# Patient Record
Sex: Male | Born: 1965 | Race: White | Hispanic: No | Marital: Single | Smoking: Former smoker
Health system: Southern US, Community
[De-identification: ages and names within clinical notes are randomized; demographics above are authoritative.]

## PROBLEM LIST (undated history)

## (undated) DIAGNOSIS — I4729 Other ventricular tachycardia: Secondary | ICD-10-CM

## (undated) DIAGNOSIS — M79606 Pain in leg, unspecified: Secondary | ICD-10-CM

## (undated) DIAGNOSIS — E785 Hyperlipidemia, unspecified: Secondary | ICD-10-CM

## (undated) DIAGNOSIS — N200 Calculus of kidney: Secondary | ICD-10-CM

## (undated) DIAGNOSIS — I2510 Atherosclerotic heart disease of native coronary artery without angina pectoris: Secondary | ICD-10-CM

## (undated) DIAGNOSIS — I472 Ventricular tachycardia: Secondary | ICD-10-CM

## (undated) DIAGNOSIS — M9250 Juvenile osteochondrosis of tibia and fibula, unspecified leg: Secondary | ICD-10-CM

## (undated) DIAGNOSIS — M92529 Juvenile osteochondrosis of tibia tubercle, unspecified leg: Secondary | ICD-10-CM

## (undated) HISTORY — DX: Pain in leg, unspecified: M79.606

## (undated) NOTE — *Deleted (*Deleted)
Transition of Care [facility]) - CM/SW Discharge Note   Patient Details  Name: TAEVEON KEESLING MRN: [id number] Date of Birth: 1966-05-10  Transition of Care [facility]) CM/SW Contact:  Lorri Frederick, LCSW Phone Number: 06/01/2020, 3:56 PM   Clinical Narrative:   Pt will discharge today if wound vac is delivered.  [employer] processing order currently.  HH in place with [facility]    Final next level of care: Home w Home Health Services Barriers to Discharge: Equipment Delay (waiting on wound vac)   Patient Goals and CMS Choice        Discharge Placement                       Discharge Plan and Services   Discharge Planning Services: CM Consult Post Acute Care Choice: Home Health, Durable Medical Equipment          DME Arranged: 3-N-1, Oxygen, Walker rolling DME Agency: [facility] Date DME Agency Contacted: 06/01/20 Time DME Agency Contacted: 1056 Representative spoke with at DME Agency: Velna Hatchet HH Arranged: RN, PT [facility] Agency: [facility] Date HH Agency Contacted: 05/31/20 Time HH Agency Contacted: 1143 Representative spoke with at [facility] Agency: weekend coverage, Bonita Quin  Social Determinants of Health (SDOH) Interventions     Readmission Risk Interventions No flowsheet data found.

---

## 1993-07-25 HISTORY — PX: LUMBAR DISC SURGERY: SHX700

## 1993-07-25 HISTORY — PX: BACK SURGERY: SHX140

## 2002-08-24 ENCOUNTER — Encounter: Payer: Self-pay | Admitting: Orthopedic Surgery

## 2002-08-24 ENCOUNTER — Emergency Department (HOSPITAL_COMMUNITY): Admission: EM | Admit: 2002-08-24 | Discharge: 2002-08-24 | Payer: Self-pay | Admitting: Emergency Medicine

## 2010-03-18 ENCOUNTER — Ambulatory Visit (HOSPITAL_COMMUNITY): Admission: RE | Admit: 2010-03-18 | Discharge: 2010-03-18 | Payer: Self-pay | Admitting: Specialist

## 2010-04-21 ENCOUNTER — Emergency Department (HOSPITAL_COMMUNITY): Admission: EM | Admit: 2010-04-21 | Discharge: 2010-04-21 | Payer: Self-pay | Admitting: Emergency Medicine

## 2010-07-25 HISTORY — PX: ULNAR COLLATERAL LIGAMENT REPAIR: SHX6159

## 2010-10-04 ENCOUNTER — Emergency Department (HOSPITAL_COMMUNITY)
Admission: EM | Admit: 2010-10-04 | Discharge: 2010-10-04 | Disposition: A | Discharge status: Home / Self Care | Payer: 59 | Attending: Emergency Medicine | Admitting: Emergency Medicine

## 2010-10-04 DIAGNOSIS — G8929 Other chronic pain: Secondary | ICD-10-CM | POA: Insufficient documentation

## 2010-10-04 DIAGNOSIS — R110 Nausea: Secondary | ICD-10-CM | POA: Insufficient documentation

## 2010-10-04 DIAGNOSIS — N200 Calculus of kidney: Secondary | ICD-10-CM | POA: Insufficient documentation

## 2010-10-04 DIAGNOSIS — R1032 Left lower quadrant pain: Secondary | ICD-10-CM | POA: Insufficient documentation

## 2010-10-04 DIAGNOSIS — M549 Dorsalgia, unspecified: Secondary | ICD-10-CM | POA: Insufficient documentation

## 2010-10-04 LAB — URINALYSIS, ROUTINE W REFLEX MICROSCOPIC
Bilirubin Urine: NEGATIVE
Glucose, UA: NEGATIVE mg/dL
Hgb urine dipstick: NEGATIVE
Ketones, ur: 15 mg/dL — AB
Nitrite: NEGATIVE
Protein, ur: NEGATIVE mg/dL
Specific Gravity, Urine: 1.017 (ref 1.005–1.030)
Urobilinogen, UA: 1 mg/dL (ref 0.0–1.0)
pH: 5.5 (ref 5.0–8.0)

## 2010-10-07 LAB — URINE MICROSCOPIC-ADD ON

## 2010-10-07 LAB — COMPREHENSIVE METABOLIC PANEL
ALT: 26 U/L (ref 0–53)
AST: 30 U/L (ref 0–37)
CO2: 20 mEq/L (ref 19–32)
Calcium: 8.9 mg/dL (ref 8.4–10.5)
Creatinine, Ser: 1.16 mg/dL (ref 0.4–1.5)
GFR calc Af Amer: >60 mL/min (ref >60)
GFR calc non Af Amer: >60 mL/min (ref >60)
Sodium: 137 mEq/L (ref 135–145)
Total Protein: 6.8 g/dL (ref 6.0–8.3)

## 2010-10-07 LAB — CBC
Hemoglobin: 14.7 g/dL (ref 13.0–17.0)
MCH: 30.8 pg (ref 26.0–34.0)
MCHC: 34.3 g/dL (ref 30.0–36.0)
Platelets: 192 10*3/uL (ref 150–400)
RDW: 13.3 % (ref 11.5–15.5)

## 2010-10-07 LAB — URINALYSIS, ROUTINE W REFLEX MICROSCOPIC
Bilirubin Urine: NEGATIVE
Glucose, UA: NEGATIVE mg/dL
Ketones, ur: NEGATIVE mg/dL
Leukocytes, UA: NEGATIVE
Nitrite: NEGATIVE
Protein, ur: NEGATIVE mg/dL
Specific Gravity, Urine: 1.016 (ref 1.005–1.030)
Urobilinogen, UA: 0.2 mg/dL (ref 0.0–1.0)
pH: 5 (ref 5.0–8.0)

## 2010-10-07 LAB — DIFFERENTIAL
Eosinophils Relative: 1 % (ref 0–5)
Lymphocytes Relative: 17 % (ref 12–46)
Lymphs Abs: 1.4 10*3/uL (ref 0.7–4.0)
Monocytes Relative: 5 % (ref 3–12)

## 2010-10-07 LAB — LIPASE, BLOOD: Lipase: 18 U/L (ref 11–59)

## 2010-12-10 NOTE — Consult Note (Signed)
NAME:  Adam Haney NO.:  [id number]   MEDICAL RECORD NO.:  [id number]                   PATIENT TYPE:  EMS   LOCATION:  ED                                   FACILITY:  [facility]   PHYSICIAN:  Dionne Ano. Everlene Other, M.D.         DATE OF BIRTH:  [birth year]   DATE OF CONSULTATION:  DATE OF DISCHARGE:  08/24/2002                                   CONSULTATION   HISTORY OF PRESENT ILLNESS:  Adam Haney presented to the [facility]  Emergency Room secondary to an injury to his right hand.  The patient was  working with a wood splitter today and had a contusive injury from the  activity.  He had a direct blow onto the middle and proximal phalanx of his  middle finger, right hand.  This is his dominate extremity.  He also  complains of some generalized hand pain.  I have evaluated him at length.  He notes no other injury.  His past medical and surgical history are  reviewed as are allergies and medications.  The patient is well known to [place]  through [facility].   PHYSICAL EXAMINATION:  On examination he is a black male in no acute  distress.  The patient had an elevated blood pressure, likely secondary to  pain.  His remaining vitals are stable.  He has a normal HEENT, chest, and  abdomen exam.  Left upper extremity is neurovascularly intact.  Right upper  extremity has pain at the middle phalanx, excruciatingly in the recent past  but improving.  He has no signs of compartment syndrome, neurovascular  compromise, or open laceration.  His nail bed is __________.  He has normal  active PFTS and extensor function as I isolate them to exam by all fingers  and thumb.  The patient does have exquisite pain over the middle phalanx  with any palpation.  He is also known to have pain in the proximal phalanx  as well about the middle finger.   X-rays reviewed and they show no fracture or dislocation especially of  __________.   IMPRESSION:   Contusive injury to the right middle finger after a wood  splitter injury.   PLAN:  I have discussed with him ice, elevation.  I have given him Vicodin  for extreme pain and written him out of work until follow up check in the  office in less than a week and discussed some dos and don'ts, etc.  Should  his pain worsen or vascular compromise ensue or other problems develop, he  will notify me.  Otherwise, will see him at that appointment.  All questions  have been encouraged and answered.  Dionne Ano. Everlene Other, M.D.    Nash Mantis  D:  08/24/2002  T:  08/24/2002  Job:  [id number]

## 2011-02-08 ENCOUNTER — Ambulatory Visit (HOSPITAL_COMMUNITY)
Admission: RE | Admit: 2011-02-08 | Discharge: 2011-02-08 | Disposition: A | Discharge status: Home / Self Care | Payer: Self-pay | Source: Ambulatory Visit | Attending: Orthopedic Surgery | Admitting: Orthopedic Surgery

## 2011-02-08 DIAGNOSIS — X58XXXA Exposure to other specified factors, initial encounter: Secondary | ICD-10-CM | POA: Insufficient documentation

## 2011-02-08 DIAGNOSIS — Z01812 Encounter for preprocedural laboratory examination: Secondary | ICD-10-CM | POA: Insufficient documentation

## 2011-02-08 DIAGNOSIS — F17200 Nicotine dependence, unspecified, uncomplicated: Secondary | ICD-10-CM | POA: Insufficient documentation

## 2011-02-08 DIAGNOSIS — S63659A Sprain of metacarpophalangeal joint of unspecified finger, initial encounter: Secondary | ICD-10-CM | POA: Insufficient documentation

## 2011-02-08 LAB — DIFFERENTIAL
Eosinophils Relative: 2 % (ref 0–5)
Lymphocytes Relative: 34 % (ref 12–46)
Lymphs Abs: 2.9 10*3/uL (ref 0.7–4.0)
Monocytes Relative: 5 % (ref 3–12)
Neutrophils Relative %: 60 % (ref 43–77)

## 2011-02-08 LAB — SURGICAL PCR SCREEN
MRSA, PCR: NEGATIVE
Staphylococcus aureus: POSITIVE — AB

## 2011-02-08 LAB — COMPREHENSIVE METABOLIC PANEL
BUN: 11 mg/dL (ref 6–23)
CO2: 24 mEq/L (ref 19–32)
Chloride: 107 mEq/L (ref 96–112)
Creatinine, Ser: 0.79 mg/dL (ref 0.50–1.35)
GFR calc Af Amer: >60 mL/min (ref >60)
GFR calc non Af Amer: >60 mL/min (ref >60)
Total Bilirubin: 0.7 mg/dL (ref 0.3–1.2)

## 2011-02-08 LAB — CBC
HCT: 41.1 % (ref 39.0–52.0)
MCH: 30.3 pg (ref 26.0–34.0)
MCV: 88.4 fL (ref 78.0–100.0)
RBC: 4.65 MIL/uL (ref 4.22–5.81)
WBC: 8.7 10*3/uL (ref 4.0–10.5)

## 2011-03-14 NOTE — Op Note (Signed)
NAME:  Adam Haney, Adam Haney NO.:  [id number]  MEDICAL RECORD NO.:  [id number]  LOCATION:  SDSC                         FACILITY:  [facility]  PHYSICIAN:  Dionne Ano. Rashawnda Gaba, M.D.DATE OF BIRTH:  [birth year]  DATE OF PROCEDURE:  02/08/2011 DATE OF DISCHARGE:                              OPERATIVE REPORT   PREOPERATIVE DIAGNOSIS:  Left thumb ulnar collateral ligament tear, complete.  POSTOPERATIVE DIAGNOSIS:  Left thumb ulnar collateral ligament tear, complete.  PROCEDURE:  Ulnar collateral ligament repair, left thumb metacarpophalangeal joint.  SURGEON:  Dionne Ano. Amanda Pea, MD  ASSISTANT:  Karie Chimera, PA-C  COMPLICATIONS:  None.  ANESTHESIA:  General.  TOURNIQUET TIME:  Less than 30 minutes.  INDICATIONS FOR PROCEDURE:  A pleasant male presents with above- mentioned diagnosis.  I have counseled him regarding risks and benefits of the surgery and he desires to proceed with the above-mentioned operative intervention.  I have discussed with him pre and postop issues, do's and dont's, relative indications and contraindications, and risks and benefits of surgery.  With this in mind, he desires to proceed.  I have discussed with him specifically that we are trading in a pre- arthritic and stable and painful situation for a situation that in time will be pain-free, stable, albeit somewhat stiff, and hopefully nonarthritic in sequelae.  He understands this and desires to proceed.  OPERATION IN DETAIL:  The patient was seen by myself and Anesthesia, taken to the operating suite, and he underwent smooth induction of general anesthesia, time-out called, consent verified, signed, and reviewed.  Following this, the patient then underwent sterile prep and drape, sterile field was secured.  Arm was elevated, tourniquet was insufflated to 250 mmHg.  A curvilinear incision was made ulnarly about the left thumb which was noted to be in stable under evaluation  under anesthesia.  Following this, I made an incision through skin only, dissected down bluntly, swept superficial radial nerve proximally. Following this, I incised the abductor to gain perfect exposure to the joint, identified a complete tear of the ulnar collateral ligament, and prepared the insertion site about the proximal phalanx.  The patient had insertion site prepared.  Following this, two 1.0 JuggerKnots were placed.  This was a Biomet JuggerKnot placed out difficulty.  It was secured and following being secured, I then repaired it by placing 4 strands through the ulnar collateral ligament, tied them upon themselves.  I then oversewed the capsule and following this repaired the abductor.  He had excellent stability and range of motion was stable.  There were no complicating features.  I deflated the tourniquet, obtained hemostasis with bipolar cautery, checked superficial radial nerve which looked to be excellently positioned in out of harm's way, and then closed the wound with Prolene.  We then placed a sterile thumb spica splint on the patient.  He was awoken from anesthesia, stable in the recovery room.  All sponge, needle, and instrument counts were correct.  He will return to see [place] in the office in 10-14 days for suture removal and our standard postop protocol according to the [facility] protocol will be adhered to.  I did not feel that he would require a  formal pinning of the joint and thus elected not to proceed with pinning.  We will line up therapy at [facility].  Do's and dont's have been discussed and all questions have been encouraged and answered.     Dionne Ano. Amanda Pea, M.D.     [facility]  D:  02/08/2011  T:  02/09/2011  Job:  [id number]  Electronically Signed by Dominica Severin M.D. on 03/14/2011 06:30:17 AM

## 2012-11-19 ENCOUNTER — Emergency Department (HOSPITAL_COMMUNITY): Payer: PRIVATE HEALTH INSURANCE

## 2012-11-19 ENCOUNTER — Encounter (HOSPITAL_COMMUNITY)
Admission: EM | Disposition: A | Discharge status: Home / Self Care | Payer: Self-pay | Source: Home / Self Care | Attending: Cardiovascular Disease

## 2012-11-19 ENCOUNTER — Inpatient Hospital Stay (HOSPITAL_COMMUNITY)
Admission: EM | Admit: 2012-11-19 | Discharge: 2012-11-22 | DRG: 247 | Disposition: A | Discharge status: Home / Self Care | Payer: PRIVATE HEALTH INSURANCE | Attending: Cardiovascular Disease | Admitting: Cardiovascular Disease

## 2012-11-19 ENCOUNTER — Encounter (HOSPITAL_COMMUNITY): Payer: Self-pay | Admitting: *Deleted

## 2012-11-19 ENCOUNTER — Ambulatory Visit (HOSPITAL_COMMUNITY): Admit: 2012-11-19 | Payer: Self-pay | Admitting: Cardiovascular Disease

## 2012-11-19 DIAGNOSIS — Z87442 Personal history of urinary calculi: Secondary | ICD-10-CM

## 2012-11-19 DIAGNOSIS — I4729 Other ventricular tachycardia: Secondary | ICD-10-CM | POA: Diagnosis present

## 2012-11-19 DIAGNOSIS — I2510 Atherosclerotic heart disease of native coronary artery without angina pectoris: Secondary | ICD-10-CM | POA: Diagnosis present

## 2012-11-19 DIAGNOSIS — I472 Ventricular tachycardia, unspecified: Secondary | ICD-10-CM | POA: Diagnosis present

## 2012-11-19 DIAGNOSIS — I2119 ST elevation (STEMI) myocardial infarction involving other coronary artery of inferior wall: Secondary | ICD-10-CM

## 2012-11-19 DIAGNOSIS — F17200 Nicotine dependence, unspecified, uncomplicated: Secondary | ICD-10-CM | POA: Diagnosis present

## 2012-11-19 DIAGNOSIS — Z8249 Family history of ischemic heart disease and other diseases of the circulatory system: Secondary | ICD-10-CM

## 2012-11-19 DIAGNOSIS — I213 ST elevation (STEMI) myocardial infarction of unspecified site: Secondary | ICD-10-CM

## 2012-11-19 DIAGNOSIS — Z720 Tobacco use: Secondary | ICD-10-CM

## 2012-11-19 DIAGNOSIS — E785 Hyperlipidemia, unspecified: Secondary | ICD-10-CM | POA: Diagnosis present

## 2012-11-19 HISTORY — DX: Calculus of kidney: N20.0

## 2012-11-19 HISTORY — PX: LEFT HEART CATHETERIZATION WITH CORONARY ANGIOGRAM: SHX5451

## 2012-11-19 HISTORY — DX: Other ventricular tachycardia: I47.29

## 2012-11-19 HISTORY — DX: Juvenile osteochondrosis of tibia and fibula, unspecified leg: M92.50

## 2012-11-19 HISTORY — DX: Atherosclerotic heart disease of native coronary artery without angina pectoris: I25.10

## 2012-11-19 HISTORY — DX: Ventricular tachycardia: I47.2

## 2012-11-19 HISTORY — DX: Hyperlipidemia, unspecified: E78.5

## 2012-11-19 HISTORY — DX: Juvenile osteochondrosis of tibia tubercle, unspecified leg: M92.529

## 2012-11-19 LAB — CBC
Hemoglobin: 15.6 g/dL (ref 13.0–17.0)
MCH: 30.6 pg (ref 26.0–34.0)
MCHC: 35.3 g/dL (ref 30.0–36.0)
Platelets: 200 10*3/uL (ref 150–400)
RDW: 13.5 % (ref 11.5–15.5)

## 2012-11-19 LAB — COMPREHENSIVE METABOLIC PANEL
ALT: 24 U/L (ref 0–53)
Calcium: 9.1 mg/dL (ref 8.4–10.5)
GFR calc Af Amer: >90 mL/min (ref >90)
Glucose, Bld: 203 mg/dL — ABNORMAL HIGH (ref 70–99)
Sodium: 136 mEq/L (ref 135–145)
Total Protein: 7.4 g/dL (ref 6.0–8.3)

## 2012-11-19 LAB — POCT I-STAT TROPONIN I: Troponin i, poc: 0 ng/mL (ref 0.00–0.08)

## 2012-11-19 LAB — POCT I-STAT, CHEM 8
BUN: 18 mg/dL (ref 6–23)
Chloride: 103 mEq/L (ref 96–112)
Sodium: 139 mEq/L (ref 135–145)

## 2012-11-19 SURGERY — LEFT HEART CATHETERIZATION WITH CORONARY ANGIOGRAM
Anesthesia: LOCAL

## 2012-11-19 MED ORDER — METOPROLOL TARTRATE 25 MG PO TABS
25.0000 mg | ORAL_TABLET | Freq: Two times a day (BID) | ORAL | Status: DC
  Administered 2012-11-19 – 2012-11-22 (×3): 25 mg via ORAL
  Filled 2012-11-19 (×8): qty 1

## 2012-11-19 MED ORDER — VERAPAMIL HCL 2.5 MG/ML IV SOLN
INTRAVENOUS | Status: AC
  Filled 2012-11-19: qty 2

## 2012-11-19 MED ORDER — HEPARIN (PORCINE) IN NACL 2-0.9 UNIT/ML-% IJ SOLN
INTRAMUSCULAR | Status: AC
  Filled 2012-11-19: qty 1000

## 2012-11-19 MED ORDER — HEPARIN SODIUM (PORCINE) 5000 UNIT/ML IJ SOLN
INTRAMUSCULAR | Status: AC
  Administered 2012-11-19: 4000 [IU] via INTRAVENOUS
  Filled 2012-11-19: qty 1

## 2012-11-19 MED ORDER — ZOLPIDEM TARTRATE 5 MG PO TABS: 5.0000 mg | ORAL_TABLET | Freq: Every evening | ORAL | Status: DC | PRN

## 2012-11-19 MED ORDER — ATORVASTATIN CALCIUM 80 MG PO TABS
80.0000 mg | ORAL_TABLET | Freq: Every day | ORAL | Status: DC
  Administered 2012-11-19 – 2012-11-21 (×3): 80 mg via ORAL
  Filled 2012-11-19 (×5): qty 1

## 2012-11-19 MED ORDER — NITROGLYCERIN 0.4 MG SL SUBL: 0.4000 mg | SUBLINGUAL_TABLET | SUBLINGUAL | Status: DC | PRN

## 2012-11-19 MED ORDER — BIVALIRUDIN 250 MG IV SOLR
INTRAVENOUS | Status: AC
  Filled 2012-11-19: qty 250

## 2012-11-19 MED ORDER — PRASUGREL HCL 10 MG PO TABS
ORAL_TABLET | ORAL | Status: AC
  Filled 2012-11-19: qty 6

## 2012-11-19 MED ORDER — SODIUM CHLORIDE 0.9 % IV SOLN: INTRAVENOUS | Status: AC

## 2012-11-19 MED ORDER — LIDOCAINE HCL (PF) 1 % IJ SOLN
INTRAMUSCULAR | Status: AC
  Filled 2012-11-19: qty 30

## 2012-11-19 MED ORDER — ASPIRIN 81 MG PO CHEW
324.0000 mg | CHEWABLE_TABLET | Freq: Once | ORAL | Status: AC
  Administered 2012-11-19: 324 mg via ORAL
  Filled 2012-11-19: qty 4

## 2012-11-19 MED ORDER — ONDANSETRON HCL 4 MG/2ML IJ SOLN: 4.0000 mg | Freq: Four times a day (QID) | INTRAMUSCULAR | Status: DC | PRN

## 2012-11-19 MED ORDER — MORPHINE SULFATE 2 MG/ML IJ SOLN: 2.0000 mg | INTRAMUSCULAR | Status: DC | PRN

## 2012-11-19 MED ORDER — MIDAZOLAM HCL 2 MG/2ML IJ SOLN
INTRAMUSCULAR | Status: AC
  Filled 2012-11-19: qty 2

## 2012-11-19 MED ORDER — HEPARIN SODIUM (PORCINE) 5000 UNIT/ML IJ SOLN
4000.0000 [IU] | INTRAMUSCULAR | Status: AC
  Administered 2012-11-19: 4000 [IU] via INTRAVENOUS

## 2012-11-19 MED ORDER — PRASUGREL HCL 10 MG PO TABS
10.0000 mg | ORAL_TABLET | Freq: Every day | ORAL | Status: DC
  Administered 2012-11-20 – 2012-11-22 (×3): 10 mg via ORAL
  Filled 2012-11-19 (×3): qty 1

## 2012-11-19 MED ORDER — ASPIRIN 81 MG PO CHEW
81.0000 mg | CHEWABLE_TABLET | Freq: Every day | ORAL | Status: DC
  Administered 2012-11-20 – 2012-11-22 (×2): 81 mg via ORAL
  Filled 2012-11-19 (×2): qty 1

## 2012-11-19 MED ORDER — DOPAMINE-DEXTROSE 3.2-5 MG/ML-% IV SOLN
INTRAVENOUS | Status: AC
  Filled 2012-11-19: qty 250

## 2012-11-19 MED ORDER — SODIUM CHLORIDE 0.9 % IV SOLN: INTRAVENOUS | Status: DC

## 2012-11-19 MED ORDER — OXYCODONE-ACETAMINOPHEN 5-325 MG PO TABS
1.0000 | ORAL_TABLET | ORAL | Status: DC | PRN
  Administered 2012-11-19 – 2012-11-21 (×5): 2 via ORAL
  Filled 2012-11-19 (×5): qty 2

## 2012-11-19 MED ORDER — NITROGLYCERIN 1 MG/10 ML FOR IR/CATH LAB
INTRA_ARTERIAL | Status: AC
  Filled 2012-11-19: qty 10

## 2012-11-19 MED ORDER — ACETAMINOPHEN 325 MG PO TABS: 650.0000 mg | ORAL_TABLET | ORAL | Status: DC | PRN

## 2012-11-19 MED ORDER — FENTANYL CITRATE 0.05 MG/ML IJ SOLN
INTRAMUSCULAR | Status: AC
  Filled 2012-11-19: qty 2

## 2012-11-19 MED ORDER — DIAZEPAM 5 MG PO TABS: 5.0000 mg | ORAL_TABLET | ORAL | Status: DC | PRN

## 2012-11-19 NOTE — ED Notes (Signed)
Report given to [facility].  Pt transported to cath lab 5

## 2012-11-19 NOTE — ED Notes (Signed)
Cardiology at bedside.  Explaining cath procedure.  Pt sts now that cp has decreased.  sts its like a burning feeling but cannot rate on a scale.

## 2012-11-19 NOTE — ED Notes (Signed)
Pt c/o CP x 2 hours, also cold like sx since Friday.  Denies SOB, n/v, dizziness/weakness.

## 2012-11-19 NOTE — Interval H&P Note (Signed)
History and Physical Interval Note:  11/19/2012 9:07 PM  Adam Haney  has presented today for cardiac cath with the diagnosis of STEMI  The various methods of treatment have been discussed with the patient and family. After consideration of risks, benefits and other options for treatment, the patient has consented to  Procedure(s): LEFT HEART CATHETERIZATION WITH CORONARY ANGIOGRAM (N/A) as a surgical intervention .  The patient's history has been reviewed, patient examined, no change in status, stable for surgery.  I have reviewed the patient's chart and labs.  Questions were answered to the patient's satisfaction.     MCALHANY,CHRISTOPHER

## 2012-11-19 NOTE — CV Procedure (Signed)
Cardiac Catheterization Operative Report  ARMSTEAD HEILAND [id number] 4/28/20148:55 PM [name], MD  Procedure Performed:  1. Left Heart Catheterization 2. Selective Coronary Angiography 3. Left ventricular angiogram 4. PTCA/DES x 2 distal RCA 5. PTCA/DES x 1 mid RCA  Operator: Verne Carrow, MD  Arterial access site:  Right radial artery.   Indication: 47 yo male with history of tobacco abuse admitted with inferior STEMI.   Delay in PCI secondary to the fact that the patient presented to the ED while we had two other patients having ST elevation MIs in the cath lab. This was after hours with the backup call team and the primary call team actively working on other patients. After arrival to the cath lab, slight delay in PCI secondary to inability to engage the left main artery from the right radial artery.                                       Procedure Details: The risks, benefits, complications, treatment options, and expected outcomes were discussed with the patient. The patient and/or family concurred with the proposed plan, giving informed consent. Emergency consent placed in chart. The patient was brought to the cath lab from the ED. The patient was further sedated with Versed and Fentanyl. The right wrist was assessed with an Allens test which was positive. The right wrist was prepped and draped in a sterile fashion. 1% lidocaine was used for local anesthesia. Using the modified Seldinger access technique, a 6 French sheath was placed in the right radial artery. 3 mg Verapamil was given through the sheath. He was given a bolus of Angiomax and a drip was started. Standard diagnostic catheters were used to perform selective coronary angiography. I ultimately engaged the left main with a XB LAD 3.5 guiding catheter. He was found to have a total occlusion of the distal RCA with high grade disease in the mid RCA with ongoing chest pain and ongoing ST segment elevation in the  inferior leads. The RCA was engaged with a 6 [place] JR4 guiding catheter. I then passed a BMW wire down the RCA into the PDA. A 2.5 x 15 mm balloon was used to pre-dilate tandem severe distal stenoses. Flow was re-established into the distal vessel/PDA/PLA. I then carefully positioned and placed a 2.75 x 16 mm Promus Premier DES in the distal RCA extending into the PDA. I then placed an overlapping 3.25 x 20 mm Promus Premier DES in the distal RCA. The most distal stent was post-dilated with a 2.75 x 12 mm [place] balloon. The most proximal segment of the distal stents was post-dilated with a 3.25 x 15 mm [place] balloon x 2. The PL branch was jailed by the stents but had TIMI-3 flow. I then carefully positioned and deployed a 3.5 x 12 mm Promus Premier DES in the mid RCA. This was post-dilated with a 4.0 x 8 mm [place] balloon x 1. There was excellent flow into the distal vessel. The guide and wire was removed. A pigtail catheter was used to perform a left ventricular angiogram. The sheath was removed from the right radial artery and a Terumo hemostasis band was applied at the arteriotomy site on the right wrist.    There were no immediate complications. The patient was taken to the recovery area in stable condition.   Hemodynamic Findings: Central aortic pressure: 111/78 Left ventricular pressure: 106/16/20  Angiographic Findings:  Left main: 20% proximal stenosis.   Left Anterior Descending Artery: Large caliber vessel that courses to the apex. 80-90% proximal stenosis. The mid and distal vessel has serial 30% stenoses. The first diagonal branch is small to moderate in caliber with 99% stenosis in the distal portion of the vessel where it becomes very small caliber (1.0 mm).   Circumflex Artery: Large caliber vessel with early moderate caliber intermediate branch with mild plaque. The continuation of the AV groove Circumflex is small to moderate in caliber with mild diffuse plaque.   Right Coronary Artery: Large  dominant vessel with mild proximal plaque, 90% mid stenosis, 100% occlusion distal vessel before the bifurcation.   Left Ventricular Angiogram: LVEF 55-60%.   Impression: 1. Acute inferior STEMI secondary to occluded distal RCA 2. Successful PTCA/DES x 2 distal RCA, x 1 mid RCA 3. Severe stenosis proximal LAD/small caliber Diagonal branch 4. Preserved LV systolic function   Recommendations: Will admit to ICU. He will need ASA and Effient for at least one year. Will start statin/beta blocker. Echo in am. Will stage PCI of LAD on Wednesday.        Complications:  None. The patient tolerated the procedure well.

## 2012-11-19 NOTE — H&P (Signed)
   Patient ID: MYRLE DUES MRN: [id number] DOB/AGE: 02/21/1966 47 y.o. Admit date: 11/19/2012  Primary Care Physician: Primary Cardiologist:   HPI: 47 yo male with h/o tobacco abuse but no prior cardiac disease to ED with c/o chest pressure x 2 hours. He has had cough and upper respiratory infection for four days. No fevers or chills. EKG in ED with inferior ST elevation c/w MI. Chest pain is resolved in ED but EKG continues to show ST elevation in inferior leads.   Review of systems complete and found to be negative unless listed above   Past Medical History  Diagnosis Date  . Nephrolithiasis     Family History  Problem Relation Age of Onset  . CAD Mother     History   Social History  . Marital Status: Married    Spouse Name: N/A    Number of Children: N/A  . Years of Education: N/A   Occupational History  . Not on file.   Social History Main Topics  . Smoking status: Current Every Day Smoker -- 0.50 packs/day for 16 years    Types: Cigarettes  . Smokeless tobacco: Not on file  . Alcohol Use: No  . Drug Use: No  . Sexually Active: Not on file   Other Topics Concern  . Not on file   Social History Narrative  . No narrative on file    Past Surgical History  Procedure Laterality Date  . Back surgery  1995    No Known Allergies  Prior to Admission Meds: Hydrocodone prn for back pain  Physical Exam: Blood pressure 138/90, pulse 70, temperature 98.1 F (36.7 C), temperature source Oral, resp. rate 16, height 6\' 3"  (1.905 m), weight 250 lb (113.399 kg), SpO2 98.00%.    General: Well developed, well nourished, NAD  HEENT: OP clear, mucus membranes moist  SKIN: warm, dry. No rashes.  Neuro: No focal deficits  Musculoskeletal: Muscle strength 5/5 all ext  Psychiatric: Mood and affect normal  Neck: No JVD, no carotid bruits, no thyromegaly, no lymphadenopathy.  Lungs:Clear bilaterally, no wheezes, rhonci, crackles  Cardiovascular: Regular rate and rhythm. No  murmurs, gallops or rubs.  Abdomen:Soft. Bowel sounds present. Non-tender.  Extremities: No lower extremity edema. Pulses are 2 + in the bilateral DP/PT.   Labs:   Lab Results  Component Value Date   WBC 8.7 02/08/2011   HGB 16.0 11/19/2012   HCT 47.0 11/19/2012   MCV 88.4 02/08/2011   PLT 268 02/08/2011     Recent Labs Lab 11/19/12 2043  NA 139  K 3.6  CL 103  BUN 18  CREATININE 1.00  GLUCOSE 203*     EKG: Sinus, ST elevation inferior leads.  ASSESSMENT AND PLAN:   1. Inferior STEMI: Plan for emergent cardiac cath with possible PCI. He has been given heparin IV 4000 Units x 1. He has received 324 mg po ASA.    Finnleigh Marchetti 11/19/2012, 8:51 PM

## 2012-11-19 NOTE — ED Provider Notes (Addendum)
History     CSN: [id number]  Arrival date & time 11/19/12  2007   First MD Initiated Contact with Patient 11/19/12 2019      Chief Complaint  Patient presents with  . Code STEMI  level V caveat due to urgent need for intervention  (Consider location/radiation/quality/duration/timing/severity/associated sxs/prior treatment) The history is provided by the patient.  patient presented with chest pain. On his left chest pressure. He states he's had a cough and shortness of breathfor the last week. No nausea or dizziness. No diaphoresis. No previous heart problems.he is a current smoker.initial EKG shows ST elevation.  Past Medical History  Diagnosis Date  . Nephrolithiasis     Past Surgical History  Procedure Laterality Date  . Back surgery  1995    Family History  Problem Relation Age of Onset  . CAD Mother     History  Substance Use Topics  . Smoking status: Current Every Day Smoker -- 0.50 packs/day for 16 years    Types: Cigarettes  . Smokeless tobacco: Not on file  . Alcohol Use: No      Review of Systems  Constitutional: Negative for activity change and appetite change.  HENT: Negative for neck stiffness.   Eyes: Negative for pain.  Respiratory: Positive for cough. Negative for chest tightness and shortness of breath.   Cardiovascular: Positive for chest pain. Negative for leg swelling.  Gastrointestinal: Negative for nausea, vomiting, abdominal pain and diarrhea.  Genitourinary: Negative for flank pain.  Musculoskeletal: Positive for back pain.  Skin: Negative for pallor and rash.    Allergies  Review of patient's allergies indicates no known allergies.  Home Medications  No current outpatient prescriptions on file.  BP 127/88  Pulse 64  Temp(Src) 98.1 F (36.7 C) (Oral)  Resp 20  Ht 6\' 3"  (1.905 m)  Wt 250 lb (113.399 kg)  BMI 31.25 kg/m2  SpO2 99%  Physical Exam  Nursing note and vitals reviewed. Constitutional: He is oriented to person,  place, and time. He appears well-developed and well-nourished.  Patient is obese and appears somewhat uncomfortable  HENT:  Head: Normocephalic and atraumatic.  Eyes: EOM are normal. Pupils are equal, round, and reactive to light.  Neck: Normal range of motion. Neck supple.  Cardiovascular: Normal rate, regular rhythm and normal heart sounds.   No murmur heard. Pulmonary/Chest: Effort normal and breath sounds normal.  Abdominal: Soft. Bowel sounds are normal. He exhibits no distension and no mass. There is no tenderness. There is no rebound and no guarding.  Musculoskeletal: Normal range of motion. He exhibits no edema.  Neurological: He is alert and oriented to person, place, and time. No cranial nerve deficit.  Skin: Skin is warm and dry.  Psychiatric: He has a normal mood and affect.    ED Course  Procedures (including critical care time)  Labs Reviewed  CBC - Abnormal; Notable for the following:    WBC 12.1 (*)    All other components within normal limits  POCT I-STAT, CHEM 8 - Abnormal; Notable for the following:    Glucose, Bld 203 (*)    Calcium, Ion 1.08 (*)    All other components within normal limits  APTT  PROTIME-INR  COMPREHENSIVE METABOLIC PANEL  POCT I-STAT TROPONIN I   Dg Chest Port 1 View  11/19/2012  *RADIOLOGY REPORT*  Clinical Data: Myocardial infarction.  Chest pain.  Smoking history.  PORTABLE CHEST - 1 VIEW  Comparison: Artifact overlies the chest.  Findings: Heart size is normal.  Mediastinal shadows are normal. Lungs are clear.  The vascularity is normal.  No effusions.  IMPRESSION: No active disease   Original Report Authenticated By: Paulina Fusi, M.D.      1. STEMI (ST elevation myocardial infarction)     Date: 11/19/2012 2009  Rate: 70  Rhythm: normal sinus rhythm  QRS Axis: normal  Intervals: normal  ST/T Wave abnormalities: ST elevations inferiorly and St depression in aVL  Conduction Disutrbances:none  Narrative Interpretation:   Old EKG  Reviewed: none available   Date: 11/19/2012 2027  Rate: 71  Rhythm: normal sinus rhythm and premature ventricular contractions (PVC)  QRS Axis: normal  Intervals: normal  ST/T Wave abnormalities: ST elevations inferiorly  Conduction Disutrbances:none  Narrative Interpretation: patient now has PVCs.  Old EKG Reviewed: changes noted     MDM  Patient presents with ST elevation MI. Left-sided chest pressure. During the brief stay in the ED the patient's chest pain resolved, however he continued to have ST elevation. Discussed the case with Dr. Clifton James and Dr. Excell Seltzer. Taken Emergently to [employer].         Juliet Rude. Rubin Payor, MD 11/19/12 2146  Juliet Rude. Rubin Payor, MD 11/19/12 [phone]

## 2012-11-20 ENCOUNTER — Encounter (HOSPITAL_COMMUNITY): Payer: Self-pay | Admitting: *Deleted

## 2012-11-20 DIAGNOSIS — I517 Cardiomegaly: Secondary | ICD-10-CM

## 2012-11-20 LAB — TROPONIN I: Troponin I: >20 ng/mL (ref <0.30)

## 2012-11-20 LAB — LIPID PANEL
Cholesterol: 186 mg/dL (ref 0–200)
Total CHOL/HDL Ratio: 4.8 RATIO
VLDL: 17 mg/dL (ref 0–40)

## 2012-11-20 LAB — CBC
MCH: 30.5 pg (ref 26.0–34.0)
Platelets: 183 10*3/uL (ref 150–400)
RBC: 4.65 MIL/uL (ref 4.22–5.81)
WBC: 9.7 10*3/uL (ref 4.0–10.5)

## 2012-11-20 LAB — BASIC METABOLIC PANEL
CO2: 23 mEq/L (ref 19–32)
Calcium: 8.8 mg/dL (ref 8.4–10.5)
GFR calc Af Amer: >90 mL/min (ref >90)
GFR calc non Af Amer: >90 mL/min (ref >90)
Sodium: 136 mEq/L (ref 135–145)

## 2012-11-20 MED ORDER — SODIUM CHLORIDE 0.9 % IJ SOLN
3.0000 mL | Freq: Two times a day (BID) | INTRAMUSCULAR | Status: DC
  Administered 2012-11-20 – 2012-11-21 (×2): 3 mL via INTRAVENOUS

## 2012-11-20 MED ORDER — SODIUM CHLORIDE 0.9 % IV SOLN
INTRAVENOUS | Status: DC
  Administered 2012-11-21: 75 mL via INTRAVENOUS

## 2012-11-20 MED ORDER — ASPIRIN 81 MG PO CHEW
324.0000 mg | CHEWABLE_TABLET | ORAL | Status: AC
  Administered 2012-11-21: 324 mg via ORAL
  Filled 2012-11-20: qty 4

## 2012-11-20 MED ORDER — SODIUM CHLORIDE 0.9 % IV SOLN: 250.0000 mL | INTRAVENOUS | Status: DC | PRN

## 2012-11-20 MED ORDER — SODIUM CHLORIDE 0.9 % IJ SOLN: 3.0000 mL | INTRAMUSCULAR | Status: DC | PRN

## 2012-11-20 MED ORDER — DIAZEPAM 5 MG PO TABS
5.0000 mg | ORAL_TABLET | ORAL | Status: AC
  Administered 2012-11-21: 5 mg via ORAL
  Filled 2012-11-20: qty 1

## 2012-11-20 MED FILL — Sodium Chloride IV Soln 0.9%: INTRAVENOUS | Qty: 50 | Status: AC

## 2012-11-20 NOTE — Progress Notes (Signed)
[id number] Cardiac Rehab Started MI education  with pt. Discussed stent, Effient, ASA, risk factors, restrictions and smoking cessation. Pt states that he quit smoking before and that he feels that he will not have any problems quitting, but he is asking for help when he is discharged if he needs it. Gave pt tips for quitting and coaching contact number. He seems motivated to quit. We will continue to follow pt. Melina Copa RN

## 2012-11-20 NOTE — Progress Notes (Signed)
    SUBJECTIVE: Mild chest pain overnight. No SOB. Some runs of SVT and VT overnight.   Tele: NSR with PVCs this am  BP 104/40  Pulse 76  Temp(Src) 98.4 F (36.9 C) (Oral)  Resp 16  Ht 6\' 3"  (1.905 m)  Wt 250 lb (113.399 kg)  BMI 31.25 kg/m2  SpO2 96%  Intake/Output Summary (Last 24 hours) at 11/20/12 0655 Last data filed at 11/20/12 0300  Gross per 24 hour  Intake    653 ml  Output    425 ml  Net    228 ml    PHYSICAL EXAM General: Well developed, well nourished, in no acute distress. Alert and oriented x 3.  Psych:  Good affect, responds appropriately Neck: No JVD. No masses noted.  Lungs: Clear bilaterally with no wheezes or rhonci noted.  Heart: RRR with no murmurs noted. Abdomen: Bowel sounds are present. Soft, non-tender.  Extremities: No lower extremity edema.   LABS: Basic Metabolic Panel:  Recent Labs  16/10/96 2021 11/19/12 2043 11/20/12 0110  NA 136 139 136  K 3.6 3.6 3.9  CL 98 103 101  CO2 25  --  23  GLUCOSE 203* 203* 165*  BUN 17 18 14   CREATININE 0.91 1.00 0.77  CALCIUM 9.1  --  8.8   CBC:  Recent Labs  11/19/12 2021 11/19/12 2043 11/20/12 0110  WBC 12.1*  --  9.7  HGB 15.6 16.0 14.2  HCT 44.2 47.0 39.9  MCV 86.7  --  85.8  PLT 200  --  183   Cardiac Enzymes:  Recent Labs  11/20/12 0110  TROPONINI >20.00*   Fasting Lipid Panel:  Recent Labs  11/20/12 0110  CHOL 186  HDL 39*  LDLCALC 130*  TRIG 85  CHOLHDL 4.8    Current Meds: . aspirin  81 mg Oral Daily  . atorvastatin  80 mg Oral q1800  . metoprolol tartrate  25 mg Oral BID  . prasugrel  10 mg Oral Daily     ASSESSMENT AND PLAN:  1. Inferior STEMI: Pt admitted last night with STEMI. RCA occluded distally. Now s/p 2 overlapping DES in the distal RCA extending into the PDA and s/p DES x 1 mid RCA. He is stable. Also has severe ostial/proximal LAD stenosis which will need intervention before discharge. Will continue ASA/Effient/metoprolol/high dose statin. Will  stage high grade LAD stenosis PCI for tomorrow. NPO at midnight tonight. Will leave in ICU today with arrythmias overnight. Echo today.   2. Tobacco abuse: Complete smoking cessation recommended.     Mairead Schwarzkopf  4/29/20146:55 AM

## 2012-11-20 NOTE — Progress Notes (Signed)
*  PRELIMINARY RESULTS* Echocardiogram 2D Echocardiogram has been performed.  Adam Haney 11/20/2012, 12:10 PM

## 2012-11-20 NOTE — Progress Notes (Signed)
CRITICAL VALUE ALERT  Critical value received:  TROP >20.00  Date of notification:  11/20/12  Time of notification: 0215am  Critical value read back:yes  Nurse who received alert:  L Uday Jantz RN  MD notified (1st page):  Hochrine  Time of first page: 0218am  Responding MD:  Hochrine  Time MD responded:  0218am  No new orders, will follow serial trops.

## 2012-11-20 NOTE — Care Management Note (Signed)
  Page 1 of 1   11/20/2012     2:24:43 PM   CARE MANAGEMENT NOTE 11/20/2012  Patient:  Adam [facility]aney, Adam Haney   Account Number:  [id number]  Date Initiated:  11/20/2012  Documentation initiated by:  Avie Arenas  Subjective/Objective Assessment:   STEMI - cardiac cath with stenting - plan to go back for further stenting on 4-30.  Has spouse     Action/Plan:   Anticipated DC Date:  11/23/2012   Anticipated DC Plan:  HOME/SELF CARE      DC Planning Services  CM consult      Choice offered to / List presented to:             Status of service:  In process, will continue to follow Medicare Important Message given?   (If response is "NO", the following Medicare IM given date fields will be blank) Date Medicare IM given:   Date Additional Medicare IM given:    Discharge Disposition:    Per UR Regulation:  Reviewed for med. necessity/level of care/duration of stay  If discussed at Long Length of Stay Meetings, dates discussed:    Comments:  Contact:  Kibler,Christy Spouse [phone]                 [facility] [phone]

## 2012-11-20 NOTE — Progress Notes (Signed)
Inpatient Diabetes Program Recommendations  AACE/ADA: New Consensus Statement on Inpatient Glycemic Control (2013)  Target Ranges:  Prepandial:   less than 140 mg/dL      Peak postprandial:   less than 180 mg/dL (1-2 hours)      Critically ill patients:  140 - 180 mg/dL   Reason for Visit: Results for Adam Haney, Adam Haney (MRN [id number]) as of 11/20/2012 12:43  Ref. Range 11/19/2012 20:21 11/19/2012 20:43 11/20/2012 01:10  Glucose Latest Range: 70-99 mg/dL 782 (H) 956 (H) 213 (H)   Note elevated glucose levels.  No history of diabetes noted.  Please check A1C to determine pre-hospitalization glycemic control.  Also please order sensitive Novolog correction tid with meals.  Will follow.

## 2012-11-21 ENCOUNTER — Encounter (HOSPITAL_COMMUNITY)
Admission: EM | Disposition: A | Discharge status: Home / Self Care | Payer: Self-pay | Source: Home / Self Care | Attending: Cardiovascular Disease

## 2012-11-21 ENCOUNTER — Encounter (HOSPITAL_COMMUNITY): Payer: Self-pay | Admitting: General Practice

## 2012-11-21 DIAGNOSIS — I2510 Atherosclerotic heart disease of native coronary artery without angina pectoris: Secondary | ICD-10-CM

## 2012-11-21 HISTORY — PX: PERCUTANEOUS CORONARY STENT INTERVENTION (PCI-S): SHX5485

## 2012-11-21 LAB — PROTIME-INR
INR: 0.95 (ref 0.00–1.49)
Prothrombin Time: 12.6 seconds (ref 11.6–15.2)

## 2012-11-21 LAB — CBC
HCT: 38 % — ABNORMAL LOW (ref 39.0–52.0)
MCHC: 34.7 g/dL (ref 30.0–36.0)
MCV: 88.6 fL (ref 78.0–100.0)
Platelets: 172 10*3/uL (ref 150–400)
RDW: 13.6 % (ref 11.5–15.5)
WBC: 10 10*3/uL (ref 4.0–10.5)

## 2012-11-21 LAB — BASIC METABOLIC PANEL
BUN: 15 mg/dL (ref 6–23)
Calcium: 8.9 mg/dL (ref 8.4–10.5)
Creatinine, Ser: 0.9 mg/dL (ref 0.50–1.35)
GFR calc Af Amer: >90 mL/min (ref >90)

## 2012-11-21 LAB — MAGNESIUM: Magnesium: 1.8 mg/dL (ref 1.5–2.5)

## 2012-11-21 SURGERY — PERCUTANEOUS CORONARY STENT INTERVENTION (PCI-S)
Anesthesia: LOCAL

## 2012-11-21 MED ORDER — HEPARIN (PORCINE) IN NACL 2-0.9 UNIT/ML-% IJ SOLN
INTRAMUSCULAR | Status: AC
  Filled 2012-11-21: qty 1000

## 2012-11-21 MED ORDER — FENTANYL CITRATE 0.05 MG/ML IJ SOLN
INTRAMUSCULAR | Status: AC
  Filled 2012-11-21: qty 2

## 2012-11-21 MED ORDER — LIDOCAINE HCL (PF) 1 % IJ SOLN
INTRAMUSCULAR | Status: AC
  Filled 2012-11-21: qty 30

## 2012-11-21 MED ORDER — MIDAZOLAM HCL 2 MG/2ML IJ SOLN
INTRAMUSCULAR | Status: AC
  Filled 2012-11-21: qty 2

## 2012-11-21 MED ORDER — SODIUM CHLORIDE 0.9 % IV SOLN: INTRAVENOUS | Status: AC

## 2012-11-21 MED ORDER — BIVALIRUDIN 250 MG IV SOLR
INTRAVENOUS | Status: AC
  Filled 2012-11-21: qty 250

## 2012-11-21 NOTE — Progress Notes (Signed)
    SUBJECTIVE: No chest pain or SOB. Several short runs of VT.   BP 104/41  Pulse 54  Temp(Src) 98.3 F (36.8 C) (Oral)  Resp 12  Ht 6\' 3"  (1.905 m)  Wt 250 lb (113.399 kg)  BMI 31.25 kg/m2  SpO2 94%  Intake/Output Summary (Last 24 hours) at 11/21/12 2956 Last data filed at 11/20/12 2200  Gross per 24 hour  Intake    706 ml  Output    150 ml  Net    556 ml    PHYSICAL EXAM General: Well developed, well nourished, in no acute distress. Alert and oriented x 3.  Psych:  Good affect, responds appropriately Neck: No JVD. No masses noted.  Lungs: Clear bilaterally with no wheezes or rhonci noted.  Heart: Huston Foley regular with no murmurs noted. Abdomen: Bowel sounds are present. Soft, non-tender.  Extremities: No lower extremity edema.   LABS: Basic Metabolic Panel:  Recent Labs  21/30/86 0110 11/21/12 0246  NA 136 139  K 3.9 4.7  CL 101 105  CO2 23 23  GLUCOSE 165* 100*  BUN 14 15  CREATININE 0.77 0.90  CALCIUM 8.8 8.9   CBC:  Recent Labs  11/20/12 0110 11/21/12 0246  WBC 9.7 10.0  HGB 14.2 13.2  HCT 39.9 38.0*  MCV 85.8 88.6  PLT 183 172   Cardiac Enzymes:  Recent Labs  11/20/12 0110 11/20/12 0645 11/20/12 1300  TROPONINI >20.00* >20.00* >20.00*   Fasting Lipid Panel:  Recent Labs  11/20/12 0110  CHOL 186  HDL 39*  LDLCALC 130*  TRIG 85  CHOLHDL 4.8    Current Meds: . aspirin  324 mg Oral Pre-Cath  . aspirin  81 mg Oral Daily  . atorvastatin  80 mg Oral q1800  . diazepam  5 mg Oral On Call  . metoprolol tartrate  25 mg Oral BID  . prasugrel  10 mg Oral Daily  . sodium chloride  3 mL Intravenous Q12H     ASSESSMENT AND PLAN:   1. Inferior STEMI: Pt admitted4/28/14 with STEMI. RCA occluded distally. Now s/p 2 overlapping DES in the distal RCA extending into the PDA and s/p DES x 1 mid RCA. He is stable. Also has severe ostial/proximal LAD stenosis. Plans for PCI/DES ostial/proximal LAD this am.  Will continue  ASA/Effient/metoprolol/high dose statin. Echo with preserved LV systolic function.   2. Tobacco abuse: Complete smoking cessation recommended.    MCALHANY,CHRISTOPHER  4/30/20147:13 AM

## 2012-11-21 NOTE — Interval H&P Note (Signed)
History and Physical Interval Note:  11/21/2012 9:58 AM  Adam [facility]aney  has presented today for PCI of the LAD with the diagnosis of CAD with recent inferior STEMI. He is having runs of VT felt to be secondary to his severe ostial/proximal LAD stenosis. He also has a small caliber diagonal with severe disease. We will re-evaluate this lesion today. The various methods of treatment have been discussed with the patient and family. After consideration of risks, benefits and other options for treatment, the patient has consented to  Procedure(s): PERCUTANEOUS CORONARY STENT INTERVENTION (PCI-S) (N/A) as a surgical intervention .  The patient's history has been reviewed, patient examined, no change in status, stable for surgery.  I have reviewed the patient's chart and labs.  Questions were answered to the patient's satisfaction.     Adam Haney

## 2012-11-21 NOTE — Progress Notes (Signed)
Patient having frequent PVC and had 5 beat NSVT  Asymptomatic. Alinda Money PA notified he want me to night dose of metoprolol now and check MAG level. I will continue to monitor.

## 2012-11-21 NOTE — CV Procedure (Signed)
   Cardiac Catheterization Operative Report  [facility] [id number] 4/30/201410:02 AM [name], MD  Procedure Performed:  1. PTCA/DES x proximal LAD  2. Angioseal RFA  Operator: Verne Carrow, MD  Indication:  47 yo male with history of CAD, tobacco abuse admitted with inferior STEMI 11/19/12. Three drug eluting stents were placed in the RCA. He also has a severe ostial/proximal LAD stenosis and a severe stenosis in the small diagonal branch. He has been having runs of VT over the last 24 hours. It is felt that his ostial LAD disease is critical and likely contributing to his VT. Plans for PCI of his LAD today.                           Procedure Details: The risks, benefits, complications, treatment options, and expected outcomes were discussed with the patient. The patient and/or family concurred with the proposed plan, giving informed consent. The patient was brought to the cath lab after IV hydration was begun and oral premedication was given. The patient was further sedated with Versed and Fentanyl. The right groin was prepped and draped in the usual manner. Using the modified Seldinger access technique, a 6 French sheath was placed in the right femoral artery. The left main was engaged with a XB LAD 3.5 guiding catheter. He was given a bolus of Angiomax and a drip was started. He has been previously loaded with Effient. When the ACT was greater than 200, I passed a BMW wire down the LAD. The ostial stenosis was pre-dilated with a 2.5 x 12 mm balloon x 1. I then carefully positioned and deployed a 3.5 x 12 mm Promus Premier DES in the ostium of the LAD. The stent was post-dilated with a 3.75 x 8 mm [place] balloon x 1. There was an excellent angiographic result. I briefly considered PCI of the very small diagonal branch however this branch was covered by the LAD stent. Since the vessel was very small, I did not feel that the benefit of angioplasty alone (too small for stent) would  outweigh the risk of jeopardizing the new LAD stent. The guide was removed. An Angioseal was placed in the right femoral artery.   There were no immediate complications. The patient was taken to the recovery area in stable condition.   Hemodynamic Findings: Central aortic pressure: 105/75  Impression: 1. Severe stenosis proximal LAD 2. Recent inferior STEMI 3. Successful PTCA/DES x 1 ostial/proximal LAD  Recommendations: He will need continued therapy with ASA and Effient for one year. Will continue statin/beta blocker. No Ace-inh with hypotension.        Complications:  None; patient tolerated the procedure well.

## 2012-11-21 NOTE — H&P (View-Only) (Signed)
    SUBJECTIVE: No chest pain or SOB. Several short runs of VT.   BP 104/41  Pulse 54  Temp(Src) 98.3 F (36.8 C) (Oral)  Resp 12  Ht 6' 3" (1.905 m)  Wt 250 lb (113.399 kg)  BMI 31.25 kg/m2  SpO2 94%  Intake/Output Summary (Last 24 hours) at 11/21/12 0713 Last data filed at 11/20/12 2200  Gross per 24 hour  Intake    706 ml  Output    150 ml  Net    556 ml    PHYSICAL EXAM General: Well developed, well nourished, in no acute distress. Alert and oriented x 3.  Psych:  Good affect, responds appropriately Neck: No JVD. No masses noted.  Lungs: Clear bilaterally with no wheezes or rhonci noted.  Heart: Brady regular with no murmurs noted. Abdomen: Bowel sounds are present. Soft, non-tender.  Extremities: No lower extremity edema.   LABS: Basic Metabolic Panel:  Recent Labs  11/20/12 0110 11/21/12 0246  NA 136 139  K 3.9 4.7  CL 101 105  CO2 23 23  GLUCOSE 165* 100*  BUN 14 15  CREATININE 0.77 0.90  CALCIUM 8.8 8.9   CBC:  Recent Labs  11/20/12 0110 11/21/12 0246  WBC 9.7 10.0  HGB 14.2 13.2  HCT 39.9 38.0*  MCV 85.8 88.6  PLT 183 172   Cardiac Enzymes:  Recent Labs  11/20/12 0110 11/20/12 0645 11/20/12 1300  TROPONINI >20.00* >20.00* >20.00*   Fasting Lipid Panel:  Recent Labs  11/20/12 0110  CHOL 186  HDL 39*  LDLCALC 130*  TRIG 85  CHOLHDL 4.8    Current Meds: . aspirin  324 mg Oral Pre-Cath  . aspirin  81 mg Oral Daily  . atorvastatin  80 mg Oral q1800  . diazepam  5 mg Oral On Call  . metoprolol tartrate  25 mg Oral BID  . prasugrel  10 mg Oral Daily  . sodium chloride  3 mL Intravenous Q12H     ASSESSMENT AND PLAN:   1. Inferior STEMI: Pt admitted4/28/14 with STEMI. RCA occluded distally. Now s/p 2 overlapping DES in the distal RCA extending into the PDA and s/p DES x 1 mid RCA. He is stable. Also has severe ostial/proximal LAD stenosis. Plans for PCI/DES ostial/proximal LAD this am.  Will continue  ASA/Effient/metoprolol/high dose statin. Echo with preserved LV systolic function.   2. Tobacco abuse: Complete smoking cessation recommended.    MCALHANY,CHRISTOPHER  4/30/20147:13 AM  

## 2012-11-22 ENCOUNTER — Telehealth: Payer: Self-pay | Admitting: Cardiovascular Disease

## 2012-11-22 ENCOUNTER — Encounter (HOSPITAL_COMMUNITY): Payer: Self-pay | Admitting: Nurse Practitioner

## 2012-11-22 DIAGNOSIS — I219 Acute myocardial infarction, unspecified: Secondary | ICD-10-CM

## 2012-11-22 DIAGNOSIS — E785 Hyperlipidemia, unspecified: Secondary | ICD-10-CM | POA: Diagnosis present

## 2012-11-22 DIAGNOSIS — I472 Ventricular tachycardia: Secondary | ICD-10-CM | POA: Diagnosis present

## 2012-11-22 LAB — CBC
MCHC: 34.1 g/dL (ref 30.0–36.0)
Platelets: 171 10*3/uL (ref 150–400)
RDW: 13.4 % (ref 11.5–15.5)

## 2012-11-22 LAB — BASIC METABOLIC PANEL
BUN: 14 mg/dL (ref 6–23)
GFR calc Af Amer: >90 mL/min (ref >90)
GFR calc non Af Amer: >90 mL/min (ref >90)
Potassium: 4.2 mEq/L (ref 3.5–5.1)
Sodium: 138 mEq/L (ref 135–145)

## 2012-11-22 MED ORDER — PRASUGREL HCL 10 MG PO TABS: 10.0000 mg | ORAL_TABLET | Freq: Every day | ORAL | Status: DC

## 2012-11-22 MED ORDER — ATORVASTATIN CALCIUM 80 MG PO TABS: 80.0000 mg | ORAL_TABLET | Freq: Every day | ORAL | Status: DC

## 2012-11-22 MED ORDER — ASPIRIN 81 MG PO CHEW: 81.0000 mg | CHEWABLE_TABLET | Freq: Every day | ORAL | Status: DC

## 2012-11-22 MED ORDER — METOPROLOL TARTRATE 25 MG PO TABS: 25.0000 mg | ORAL_TABLET | Freq: Two times a day (BID) | ORAL | Status: DC

## 2012-11-22 MED ORDER — NITROGLYCERIN 0.4 MG SL SUBL: 0.4000 mg | SUBLINGUAL_TABLET | SUBLINGUAL | Status: DC | PRN

## 2012-11-22 MED FILL — Sodium Chloride IV Soln 0.9%: INTRAVENOUS | Qty: 50 | Status: AC

## 2012-11-22 NOTE — Discharge Summary (Signed)
Patient ID: Adam Haney,  MRN: [id number], DOB/AGE: [birth year] 47 y.o.  Admit date: 11/19/2012 Discharge date: 11/22/2012  Primary Care Provider: FRY,[name] Primary Cardiologist: C. Clifton James, MD  Discharge Diagnoses Principal Problem:   ST elevation myocardial infarction (STEMI) of inferior wall  **s/p PCI/DES x 2 to distal RCA, DES x 1 to mid RCA, and staged PCI/DES x 1 to LAD.  Active Problems:   Coronary atherosclerosis of native coronary artery   Tobacco abuse   NSVT (nonsustained ventricular tachycardia)  **Asymptomatic;  Normal EF by echo.   Hyperlipidemia  **LDL 130  Allergies No Known Allergies  Procedures  Cardiac Catheterization and Percutaneous Coronary Intervention 4.28.2014  Hemodynamic Findings: Central aortic pressure: 111/78 Left ventricular pressure: 106/16/20  Angiographic Findings:  Left main: 20% proximal stenosis.   Left Anterior Descending Artery: Large caliber vessel that courses to the apex. 80-90% proximal stenosis. The mid and distal vessel has serial 30% stenoses. The first diagonal branch is small to moderate in caliber with 99% stenosis in the distal portion of the vessel where it becomes very small caliber (1.0 mm).   Circumflex Artery: Large caliber vessel with early moderate caliber intermediate branch with mild plaque. The continuation of the AV groove Circumflex is small to moderate in caliber with mild diffuse plaque.   Right Coronary Artery: Large dominant vessel with mild proximal plaque, 90% mid stenosis, 100% occlusion distal vessel before the bifurcation.    **A 2.75 x 16 mm Promus Premier DES in the distal RCA extending into the PDA.  **An overlapping 3.25 x 20 mm Promus Premier DES was placed in the distal RCA.  **A 3.5 x 12 mm Promus Premier DES was placed in the mid RCA.  Left Ventricular Angiogram: LVEF 55-60%.  _____________  Percutaneous Coronary Intervention 4.30.2014   **3.5 x 12 mm Promus Premier DES was placed in  the ostium of the LAD.  _____________  2D Echocardiogram 4.29.2014  Study Conclusions  - Left ventricle: Technically difficult. I do not seea large   wall abnrmality. There is decreased thickening of the   inferior wall. The cavity size was normal. Wall thickness   was increased in a pattern of mild LVH. The estimated   ejection fraction was 55%. - Right ventricle: The cavity size was normal. Systolic   function was mildly reduced. _____________  History of Present Illness  47 y/o male without prior h/o CAD who was in his usual state of health until the evening of admission when he developed sudden onset of chest pressure.  Discomfort persisted for approximately 2 hrs prior to him presenting to the [facility] for evaluation.  There, ECG showed inferior ST elevation.  Code STEMI was called and patient was taken emergently to the cardiac catheterization laboratory.  Hospital Course  Patient underwent emergent diagnostic cardiac catheterization revealing a total occlusion of the distal RCA, which was felt to be the infarct vessel.  He also have severe LAD stenosis.  The RCA was successfully treated using overlapping drug eluting stents distally along with a single DES in the mid-section of the artery.  It was felt that his LAD would require a staged intervention.  In the interim, Mr. Adam Haney was monitored in the coronary intensive care unit.  There, he ruled in for MI, eventually peaking his troponin @ > 20.  He was initiated on asa, effient, high potency statin, and beta blocker therapy.  Acei Inhibitor/ARB therapy was not initiated secondary to relative hypotension.    While monitored in  the CCU, Mr. Adam [facility]aney was noted to have frequent, brief runs of asymptomatic NSVT.  2D echocardiogram was carried out on 4/29 and showed normal LV function.  He was maintained on beta blocker as above.  On 4/30, he was taken back to the cath lab and underwent successful PCI and stenting of the ostial LAD with  placement of a 3.5 x 12mm Promus Premier DES.  He tolerated this procedure well and post-procedure has been ambulating without recurrent symptoms or limitations.  He has been seen by cardiac rehab and counseled on the importance of tobacco cessation, medication compliance, symptom reporting, and lifestyle changes.  He will be discharged home today in good condition.  Discharge Vitals Blood pressure 92/67, pulse 69, temperature 97.8 F (36.6 C), temperature source Oral, resp. rate 14, height 6\' 3"  (1.905 m), weight 251 lb 8.7 oz (114.1 kg), SpO2 96.00%.  Filed Weights   11/19/12 2039 11/22/12 0524  Weight: 250 lb (113.399 kg) 251 lb 8.7 oz (114.1 kg)   Labs  CBC  Recent Labs  11/21/12 0246 11/22/12 0447  WBC 10.0 8.0  HGB 13.2 12.4*  HCT 38.0* 36.4*  MCV 88.6 88.6  PLT 172 171   Basic Metabolic Panel  Recent Labs  11/21/12 0246 11/21/12 2016 11/22/12 0447  NA 139  --  138  K 4.7  --  4.2  CL 105  --  105  CO2 23  --  25  GLUCOSE 100*  --  98  BUN 15  --  14  CREATININE 0.90  --  0.92  CALCIUM 8.9  --  8.8  MG  --  1.8  --    Liver Function Tests  Recent Labs  11/19/12 2021  AST 24  ALT 24  ALKPHOS 95  BILITOT 0.4  PROT 7.4  ALBUMIN 3.4*   Cardiac Enzymes  Recent Labs  11/20/12 0110 11/20/12 0645 11/20/12 1300  TROPONINI >20.00* >20.00* >20.00*   Fasting Lipid Panel  Recent Labs  11/20/12 0110  CHOL 186  HDL 39*  LDLCALC 130*  TRIG 85  CHOLHDL 4.8   Thyroid Function Tests  Recent Labs  11/20/12 0110  TSH 0.924   Disposition  Pt is being discharged home today in good condition.  Follow-up Plans & Appointments  Follow-up Information   Follow up with Adam Newcomer, PA-C On 12/05/2012. (8:50 AM - Dr. Gibson Ramp PA.)    Contact information:   [address] [place] [address] [phone]      Discharge Medications    Medication List    STOP taking these medications       ALKA-SELTZER PLUS COLD & COUGH  11-23-08-325 MG Caps  Generic drug:  [id number]      TAKE these medications       aspirin 81 MG chewable tablet  Chew 1 tablet (81 mg total) by mouth daily.     atorvastatin 80 MG tablet  Commonly known as:  LIPITOR  Take 1 tablet (80 mg total) by mouth daily at 6 PM.     guaiFENesin 600 MG 12 hr tablet  Commonly known as:  MUCINEX  Take 1,200 mg by mouth 2 (two) times daily as needed for congestion.     metoprolol tartrate 25 MG tablet  Commonly known as:  LOPRESSOR  Take 1 tablet (25 mg total) by mouth 2 (two) times daily.     nitroGLYCERIN 0.4 MG SL tablet  Commonly known as:  NITROSTAT  Place 1 tablet (0.4 mg  total) under the tongue every 5 (five) minutes x 3 doses as needed for chest pain.     prasugrel 10 MG Tabs  Commonly known as:  EFFIENT  Take 1 tablet (10 mg total) by mouth daily.      Outstanding Labs/Studies  F/u lipids/lft's in 8 weeks (new statin).  Duration of Discharge Encounter   Greater than 30 minutes including physician time.  Signed, Nicolasa Ducking NP 11/22/2012, 8:06 AM

## 2012-11-22 NOTE — Progress Notes (Signed)
[phone] Cardiac Rehab Pt has been up walked in hall independently denies any cp or SOB. Completed MI and stent education with pt and wife. They voice understanding. Pt declines Outpt. CRP due to his work hours. Melina Copa RN

## 2012-11-22 NOTE — Discharge Summary (Signed)
See full note this am. cdm 

## 2012-11-22 NOTE — Progress Notes (Signed)
PER [employer] $275 COPAY/30 DAY SUPPLY  OF EFFIENT 10MG - NO PRIOR AUTH REQ'd - PATIENT CAN USE ANY PHARMACY. Pt aware prior to discharge home and given Effient 30 day free card and prescription refill card. Elmire Amrein J. Lucretia Roers, RN, BSN, [employer] [phone].

## 2012-11-22 NOTE — Progress Notes (Signed)
    SUBJECTIVE:  Feels great. No chest pain or SOB. Ambulating without difficulty. One 5 beat run VT last 24 hours.   Tele: NSR with PVCs.   BP 92/67  Pulse 69  Temp(Src) 97.8 F (36.6 C) (Oral)  Resp 14  Ht 6\' 3"  (1.905 m)  Wt 251 lb 8.7 oz (114.1 kg)  BMI 31.44 kg/m2  SpO2 96%  Intake/Output Summary (Last 24 hours) at 11/22/12 0701 Last data filed at 11/21/12 1900  Gross per 24 hour  Intake 548.75 ml  Output      0 ml  Net 548.75 ml    PHYSICAL EXAM General: Well developed, well nourished, in no acute distress. Alert and oriented x 3.  Psych:  Good affect, responds appropriately Neck: No JVD. No masses noted.  Lungs: Clear bilaterally with no wheezes or rhonci noted.  Heart: RRR with no murmurs noted. Abdomen: Bowel sounds are present. Soft, non-tender.  Extremities: No lower extremity edema. Right wrist and right groin cath sites ok.   LABS: Basic Metabolic Panel:  Recent Labs  16/10/96 0246 11/21/12 2016 11/22/12 0447  NA 139  --  138  K 4.7  --  4.2  CL 105  --  105  CO2 23  --  25  GLUCOSE 100*  --  98  BUN 15  --  14  CREATININE 0.90  --  0.92  CALCIUM 8.9  --  8.8  MG  --  1.8  --    CBC:  Recent Labs  11/21/12 0246 11/22/12 0447  WBC 10.0 8.0  HGB 13.2 12.4*  HCT 38.0* 36.4*  MCV 88.6 88.6  PLT 172 171   Cardiac Enzymes:  Recent Labs  11/20/12 0110 11/20/12 0645 11/20/12 1300  TROPONINI >20.00* >20.00* >20.00*   Fasting Lipid Panel:  Recent Labs  11/20/12 0110  CHOL 186  HDL 39*  LDLCALC 130*  TRIG 85  CHOLHDL 4.8    Current Meds: . aspirin  81 mg Oral Daily  . atorvastatin  80 mg Oral q1800  . metoprolol tartrate  25 mg Oral BID  . prasugrel  10 mg Oral Daily     ASSESSMENT AND PLAN:   1. Inferior STEMI: Pt admitted 11/19/12 with STEMI. RCA occluded distally. Now s/p 2 overlapping DES in the distal RCA extending into the PDA and s/p DES x 1 mid RCA. Staged PCI yesterday with DES x 1 ostial LAD in area of high grade  stenosis. He is stable this am. Having PVCs since his MI. Will continue ASA/Effient/metoprolol/high dose statin. Echo with preserved LV systolic function.   2. Tobacco abuse: Complete smoking cessation recommended.   3. PVCs: Continue beta blocker.   4. Dispo: D/C home today. He can f/u with me or Tereso Newcomer, PA-C in 10-14 days. He needs to be out of work until f/u appt. He needs work excuse. He also needs Effient starter packet.    MCALHANY,CHRISTOPHER  5/1/20147:01 AM

## 2012-11-22 NOTE — Telephone Encounter (Signed)
TCM management call to patient. Patient contacted regarding discharge from hospital on 11/22/12.  Patient understands to follow up with Tereso Newcomer, PA, on 12/05/12 at 8:50am. Pt verbalizes understanding of medications, followup appt and discharge instructions. Advised pt to call should he have questions, concerns or problems prior to his appt on 12/05/12. Patient agreed to plan.

## 2012-11-22 NOTE — Telephone Encounter (Signed)
**Note De-identified Nicoya Friel Obfuscation** LMTCB

## 2012-11-22 NOTE — Telephone Encounter (Signed)
New Problem:    Patient is scheduled to have a 14 day TCM appointment with Tereso Newcomer PA on 12/05/12 at 8:50am per Ward Givens PA.

## 2012-11-30 ENCOUNTER — Telehealth: Payer: Self-pay | Admitting: Cardiovascular Disease

## 2012-11-30 NOTE — Telephone Encounter (Signed)
LMOVM for Pt FMLA/[employer] paperwork ready For Pick Up 11/30/12/KM

## 2012-11-30 NOTE — Telephone Encounter (Signed)
Pt Picked Up FMLA papers  11/30/12/KM

## 2012-12-05 ENCOUNTER — Encounter: Payer: Self-pay | Admitting: Cardiovascular Disease

## 2012-12-05 ENCOUNTER — Telehealth: Payer: Self-pay | Admitting: Cardiovascular Disease

## 2012-12-05 ENCOUNTER — Ambulatory Visit (INDEPENDENT_AMBULATORY_CARE_PROVIDER_SITE_OTHER): Payer: BC Managed Care – PPO | Admitting: Cardiovascular Disease

## 2012-12-05 ENCOUNTER — Encounter: Payer: Self-pay | Admitting: *Deleted

## 2012-12-05 ENCOUNTER — Encounter: Payer: PRIVATE HEALTH INSURANCE | Admitting: Physician Assistant

## 2012-12-05 VITALS — BP 122/92 | HR 78 | Ht 75.0 in | Wt 246.8 lb

## 2012-12-05 DIAGNOSIS — I2510 Atherosclerotic heart disease of native coronary artery without angina pectoris: Secondary | ICD-10-CM

## 2012-12-05 MED ORDER — METOPROLOL TARTRATE 25 MG PO TABS: 12.5000 mg | ORAL_TABLET | Freq: Two times a day (BID) | ORAL | Status: DC

## 2012-12-05 NOTE — Telephone Encounter (Signed)
New Problem:    Patient's wife called in wanting to know if her husband needed to be seen sooner than tomorrow because he has knots on his legs.  Please call back.

## 2012-12-05 NOTE — Patient Instructions (Signed)
You have an appt with Tereso Newcomer, PA on December 25, 2012 at 8:30  Your physician has recommended you make the following change in your medication: Decrease metoprolol to 12. 5 mg by mouth twice daily.  We will make referral to Cardiac Rehab.

## 2012-12-05 NOTE — Telephone Encounter (Signed)
Reviewed with Dr.McAlhany and he will see pt today at 4:45. I spoke with pt's wife and gave her this appt time.

## 2012-12-05 NOTE — Telephone Encounter (Signed)
Pt seen today. cdm

## 2012-12-05 NOTE — Telephone Encounter (Signed)
Spoke with wife. She reports she noticed "knots" on pt's lower legs last evening. Describes as being one on front and back of both lower legs.  Wife states these are soft ball sized. Normal color. Sore only when touched. Areas soft.  Feet warm to touch and not discolored. Cath site in groin with bruising but no knot in groin area.

## 2012-12-05 NOTE — Progress Notes (Signed)
History of Present Illness: 47 yo Haney with history of CAD with recent inferior STEMI, tobacco abuse, HLD here today for cardiac follow up. He was admitted to [facility] 11/19/12 with inferior STEMI. Urgent cardiac cath 11/19/12 with 90% mid RCA stenosis, 100% distal RCA stenosis. The distal vessel was treated with overlapping drug eluting stents and the mid vessel was treated with a drug eluting stent. He was brought back 12-18-12 for staged PCI of the severe ostial LAD stenosis treated with a DES x 1. He had residual high grade disease in the small caliber diagonal branch which was felt to be too small for PCI. LVEF 55% by echo post cath.   He is here today for cardiac follow up. He is feeling well overall with some fatigue and weakness. No chest pain or SOB. No LE edema. His wife did notice circular areas of swelling in the left calf last night. They were "softball sized" per his wife. He did not notice them until they were pointed out. No redness, associated itching or hives. No insect bites. Resolved last night. No LE edema.   Primary Care Physician: None  Last Lipid Profile:Lipid Panel     Component Value Date/Time   CHOL 186 11/20/2012 0110   TRIG 85 11/20/2012 0110   HDL 39* 11/20/2012 0110   CHOLHDL 4.8 11/20/2012 0110   VLDL 17 11/20/2012 0110   LDLCALC 130* 11/20/2012 0110     Past Medical History  Diagnosis Date  . Coronary artery disease     a. 10/2012 Inf STEMI/Cath/PCI: LM 20p, LAD 80-90p, 6m/d, D1 sm 99d, LCX mild plaque, RCA 100d (2.75x16 Promus Premier DES into PDA, 3.25x20 Promus Prem DES dRCA, 3.5x12 Promus Prem DES mRCA), EF 55-60%;  b. 10/2012 Staged PCI of LAD: 3.5x12 Promus Prem DES.  . Osgood-Schlatter's disease [phone]    "right knee" (12-18-2012)  . Nephrolithiasis     "passed on it's own" (18-Dec-2012)  . Hyperlipidemia   . NSVT (nonsustained ventricular tachycardia)     a. in setting of MI 10/2012 - Echo: EF 55%, mild LVH.    Past Surgical History  Procedure Laterality Date   . Back surgery  1995  . Lumbar disc surgery  1995    "L5-S1 ruptured disc" (12-18-2012)  . Ulnar collateral ligament repair Left 2012    Current Outpatient Prescriptions  Medication Sig Dispense Refill  . aspirin 81 MG chewable tablet Chew 1 tablet (81 mg total) by mouth daily.      [name] atorvastatin (LIPITOR) 80 MG tablet Take 1 tablet (80 mg total) by mouth daily at 6 PM.  30 tablet  6  . guaiFENesin (MUCINEX) 600 MG 12 hr tablet Take 1,200 mg by mouth 2 (two) times daily as needed for congestion.      . metoprolol tartrate (LOPRESSOR) 25 MG tablet Take 1 tablet (25 mg total) by mouth 2 (two) times daily.  60 tablet  6  . nitroGLYCERIN (NITROSTAT) 0.4 MG SL tablet Place 1 tablet (0.4 mg total) under the tongue every 5 (five) minutes x 3 doses as needed for chest pain.  25 tablet  3  . prasugrel (EFFIENT) 10 MG TABS Take 1 tablet (10 mg total) by mouth daily.  30 tablet  6   No current facility-administered medications for this visit.    No Known Allergies  History   Social History  . Marital Status: Married    Spouse Name: N/A    Number of Children: N/A  . Years of  Education: N/A   Occupational History  . Not on file.   Social History Main Topics  . Smoking status: Current Every Day Smoker -- 0.50 packs/day for 18 years    Types: Cigarettes  . Smokeless tobacco: Never Used     Comment: 11/21/2012 offered smoking sessation materials; pt declines  . Alcohol Use: No  . Drug Use: No  . Sexually Active: Yes   Other Topics Concern  . Not on file   Social History Narrative  . No narrative on file    Family History  Problem Relation Age of Onset  . CAD Mother     Review of Systems:  As stated in the HPI and otherwise negative.   BP 122/92  Pulse Adam  Ht 6\' 3"  (1.905 m)  Wt 246 lb 12.8 oz (111.948 kg)  BMI 30.85 kg/m2  Physical Examination: General: Well developed, well nourished, NAD HEENT: OP clear, mucus membranes moist SKIN: warm, dry. No rashes. Neuro: No  focal deficits Musculoskeletal: Muscle strength 5/5 all ext Psychiatric: Mood and affect normal Neck: No JVD, no carotid bruits, no thyromegaly, no lymphadenopathy. Lungs:Clear bilaterally, no wheezes, rhonci, crackles Cardiovascular: Regular rate and rhythm. No murmurs, gallops or rubs. Abdomen:Soft. Bowel sounds present. Non-tender.  Extremities: No lower extremity edema. Pulses are 2 + in the bilateral DP/PT.  EKG: Sinus with occasional PVCs. Rate Adam bpm. T wave abnormality inferior leads.   Cardiac cath 11/19/12: Left main: 20% proximal stenosis.  Left Anterior Descending Artery: Large caliber vessel that courses to the apex. 80-90% proximal stenosis. The mid and distal vessel has serial 30% stenoses. The first diagonal branch is small to moderate in caliber with 99% stenosis in the distal portion of the vessel where it becomes very small caliber (1.0 mm).  Circumflex Artery: Large caliber vessel with early moderate caliber intermediate branch with mild plaque. The continuation of the AV groove Circumflex is small to moderate in caliber with mild diffuse plaque.  Right Coronary Artery: Large dominant vessel with mild proximal plaque, 90% mid stenosis, 100% occlusion distal vessel before the bifurcation.  Left Ventricular Angiogram: LVEF 55-60%.   Assessment and Plan:   1. CAD: He is stable post MI. No chest pains. He is on ASA, Effient and statin. Will have him reduce metoprolol to 12.5 mg po BID with recent fatigue. If he has continued fatigue, will stop Metoprolol. Work excuse until seen 12/25/12.   2. Tobacco abuse: He has stopped smoking.

## 2012-12-06 ENCOUNTER — Encounter: Payer: PRIVATE HEALTH INSURANCE | Admitting: Cardiovascular Disease

## 2012-12-25 ENCOUNTER — Encounter: Payer: Self-pay | Admitting: *Deleted

## 2012-12-25 ENCOUNTER — Ambulatory Visit (INDEPENDENT_AMBULATORY_CARE_PROVIDER_SITE_OTHER): Payer: BC Managed Care – PPO | Admitting: Physician Assistant

## 2012-12-25 ENCOUNTER — Encounter: Payer: Self-pay | Admitting: Physician Assistant

## 2012-12-25 VITALS — BP 132/86 | HR 84 | Ht 75.0 in | Wt 252.4 lb

## 2012-12-25 DIAGNOSIS — I2510 Atherosclerotic heart disease of native coronary artery without angina pectoris: Secondary | ICD-10-CM

## 2012-12-25 DIAGNOSIS — E785 Hyperlipidemia, unspecified: Secondary | ICD-10-CM

## 2012-12-25 DIAGNOSIS — R5383 Other fatigue: Secondary | ICD-10-CM

## 2012-12-25 DIAGNOSIS — R5381 Other malaise: Secondary | ICD-10-CM

## 2012-12-25 LAB — BASIC METABOLIC PANEL
BUN: 11 mg/dL (ref 6–23)
CO2: 27 mEq/L (ref 19–32)
GFR: 97.7 mL/min (ref >60.00)
Glucose, Bld: 92 mg/dL (ref 70–99)
Potassium: 3.8 mEq/L (ref 3.5–5.1)
Sodium: 139 mEq/L (ref 135–145)

## 2012-12-25 LAB — CBC WITH DIFFERENTIAL/PLATELET
Basophils Absolute: 0.1 10*3/uL (ref 0.0–0.1)
Eosinophils Absolute: 0.1 10*3/uL (ref 0.0–0.7)
HCT: 42.4 % (ref 39.0–52.0)
Hemoglobin: 14.2 g/dL (ref 13.0–17.0)
Lymphs Abs: 2.1 10*3/uL (ref 0.7–4.0)
MCHC: 33.4 g/dL (ref 30.0–36.0)
MCV: 91.8 fl (ref 78.0–100.0)
Monocytes Absolute: 0.5 10*3/uL (ref 0.1–1.0)
Monocytes Relative: 5.2 % (ref 3.0–12.0)
Neutro Abs: 6 10*3/uL (ref 1.4–7.7)
Platelets: 224 10*3/uL (ref 150.0–400.0)
RDW: 13.8 % (ref 11.5–14.6)

## 2012-12-25 NOTE — Patient Instructions (Addendum)
LABS TODAY; BMET, CBC W/DIFF  YOU HAVE BEEN GIVEN A LETTER TO STAY OUT OF WORK UNTIL YOUR NEXT FOLLOW  IN THE NEXT FEW WEEKS  PLEASE CALL THE OFFICE [phone] IF YOU ARE STILL HAVING FATIGUE AFTER 1 WEEKS FROM TODAY'S VISIT PER [name] [employer]AVER,P[employer]  YOU HAVE A FOLLOW UP WITH [place] 01/15/13 @ 3:40

## 2012-12-25 NOTE — Progress Notes (Signed)
[address] [place], [place]  [address] Phone: [phone] Fax:  [phone]  Date:  12/25/2012   ID:  Bethann Punches, DOB [birth year], MRN [id number]  PCP:  No primary provider on file.  Cardiologist:  Dr. Verne Carrow     History of Present Illness: Adam Haney is a 47 y.o. male who returns for follow up.  He has a hx of CAD, HL, tobacco abuse.  He was admitted in 10/2102 with an inferior STEMI.  He had 90% mid RCA, 100% distal RCA and 80-90% prox LAD on LHC and was treated with overlapping DES to the dRCA and DES x 1 to the mRCA.  He underwent staged PCI of the LAD with DES during that admission.  He does have a small diagonal with high grade disease that is treated medically as the vessel is not amenable to PCI.  Echo 11/20/12: mild LVH, EF 55%, mild reduced RVSF.  He was seen by Dr. Verne Carrow in f/u 12/05/12.  He had significant fatigue and his beta blocker dose was reduced.  He was given a note to remain out of work until seen today.  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  He stopped his metoprolol 3 days ago.  Fatigue is better with reducing and stopping the metoprolol.  But, he still has significant fatigue.  No chest pain, dyspnea, syncope, near syncope, orthopnea, PND, edema.    Labs (5/14):  K 4.2, Cr 0.92, LDL 130, Hgb 12.4, TSH 0.924   Wt Readings from Last 3 Encounters:  12/25/12 252 lb 6.4 oz (114.488 kg)  12/05/12 246 lb 12.8 oz (111.948 kg)  11/22/12 251 lb 8.7 oz (114.1 kg)     Past Medical History  Diagnosis Date  . Coronary artery disease     a. 10/2012 Inf STEMI/Cath/PCI: LM 20p, LAD 80-90p, 60m/d, D1 sm 99d, LCX mild plaque, RCA 100d (2.75x16 Promus Premier DES into PDA, 3.25x20 Promus Prem DES dRCA, 3.5x12 Promus Prem DES mRCA), EF 55-60%;  b. 10/2012 Staged PCI of LAD: 3.5x12 Promus Prem DES.  . Osgood-Schlatter's disease [phone]    "right knee" (12-01-2012)  . Nephrolithiasis     "passed on it's own"  (Dec 01, 2012)  . Hyperlipidemia   . NSVT (nonsustained ventricular tachycardia)     a. in setting of MI 10/2012 - Echo: EF 55%, mild LVH.    Current Outpatient Prescriptions  Medication Sig Dispense Refill  . aspirin 81 MG chewable tablet Chew 1 tablet (81 mg total) by mouth daily.      [name] atorvastatin (LIPITOR) 80 MG tablet Take 1 tablet (80 mg total) by mouth daily at 6 PM.  30 tablet  6  . nitroGLYCERIN (NITROSTAT) 0.4 MG SL tablet Place 1 tablet (0.4 mg total) under the tongue every 5 (five) minutes x 3 doses as needed for chest pain.  25 tablet  3  . prasugrel (EFFIENT) 10 MG TABS Take 1 tablet (10 mg total) by mouth daily.  30 tablet  6   No current facility-administered medications for this visit.    Allergies:   No Known Allergies  Social History:  The patient  reports that he has quit smoking. His smoking use included Cigarettes. He has a 9 pack-year smoking history. He has never used smokeless tobacco. He reports that he does not drink alcohol or use illicit drugs.   ROS:  Please see the history of present illness.   No melena, hematochezia, hematuria.  No myalgias.   No depression symptoms.  All other systems reviewed and negative.   PHYSICAL EXAM: VS:  BP 132/86  Pulse 84  Ht 6\' 3"  (1.905 m)  Wt 252 lb 6.4 oz (114.488 kg)  BMI 31.55 kg/m2 Well nourished, well developed, in no acute distress HEENT: normal Neck: no JVD Cardiac:  normal S1, S2; RRR; no murmur Lungs:  clear to auscultation bilaterally, no wheezing, rhonchi or rales Abd: soft, nontender, no hepatomegaly Ext: no edema Skin: warm and dry Neuro:  CNs 2-12 intact, no focal abnormalities noted  EKG:  NSR, HR 85, inferior Q waves with inferolateral T wave inversions     ASSESSMENT AND PLAN:  1. Fatigue:  Most likely culprit is the beta blocker.  He is off this for 3 days now.  Will keep him off for now.  Check CBC and BMET.  If weakness continues after 1 week from now, consider holding or changing statin.  Will  keep out of work until he sees me back. 2. CAD:  No angina.  Continue DAPT, statin. He is not certain his insurance will cover his cardiac rehab.  He is walking on his own.  3. Hyperlipidemia:  Continue statin. 4. Tobacco Abuse:  He has quit smoking.   Signed, Tereso Newcomer, PA-C  12/25/2012 8:41 AM

## 2012-12-26 ENCOUNTER — Telehealth: Payer: Self-pay | Admitting: *Deleted

## 2012-12-26 NOTE — Telephone Encounter (Signed)
Message copied by Tarri Fuller on Wed Dec 26, 2012 11:54 AM ------      Message from: [place], [place] T      Created: Tue Dec 25, 2012  5:33 PM       Potassium and creatinine okay      Hemoglobin normal      Continue current Rx      Tereso Newcomer, PA-C        12/25/2012 5:33 PM ------

## 2012-12-26 NOTE — Telephone Encounter (Signed)
voice mail not set up on pt's phone, and his work # they said pt does not work there. I called the pt's wife Adam Haney who has now been notified about lab results with verbal understanding today and said she will let pt know

## 2013-01-15 ENCOUNTER — Ambulatory Visit: Payer: BC Managed Care – PPO | Admitting: Physician Assistant

## 2013-01-15 ENCOUNTER — Ambulatory Visit (INDEPENDENT_AMBULATORY_CARE_PROVIDER_SITE_OTHER): Payer: BC Managed Care – PPO | Admitting: Physician Assistant

## 2013-01-15 ENCOUNTER — Encounter: Payer: Self-pay | Admitting: *Deleted

## 2013-01-15 ENCOUNTER — Encounter: Payer: Self-pay | Admitting: Physician Assistant

## 2013-01-15 ENCOUNTER — Telehealth: Payer: Self-pay | Admitting: Family Medicine

## 2013-01-15 VITALS — BP 140/81 | HR 103 | Ht 75.5 in | Wt 253.0 lb

## 2013-01-15 DIAGNOSIS — R0609 Other forms of dyspnea: Secondary | ICD-10-CM

## 2013-01-15 DIAGNOSIS — F4321 Adjustment disorder with depressed mood: Secondary | ICD-10-CM

## 2013-01-15 DIAGNOSIS — E785 Hyperlipidemia, unspecified: Secondary | ICD-10-CM

## 2013-01-15 DIAGNOSIS — I2510 Atherosclerotic heart disease of native coronary artery without angina pectoris: Secondary | ICD-10-CM

## 2013-01-15 DIAGNOSIS — R5381 Other malaise: Secondary | ICD-10-CM

## 2013-01-15 DIAGNOSIS — R0683 Snoring: Secondary | ICD-10-CM

## 2013-01-15 DIAGNOSIS — R5383 Other fatigue: Secondary | ICD-10-CM

## 2013-01-15 MED ORDER — DILTIAZEM HCL ER COATED BEADS 120 MG PO CP24: 120.0000 mg | ORAL_CAPSULE | Freq: Every day | ORAL | Status: DC

## 2013-01-15 NOTE — Progress Notes (Signed)
[address] [place], [place]  [address] Phone: [phone] Fax:  [phone]  Date:  01/15/2013   ID:  Adam Haney, DOB 03/24/1966, MRN [id number]  PCP:  No primary provider on file.  Cardiologist:  Dr. Verne Carrow     History of Present Illness: Adam Haney is a 47 y.o. male who returns for follow up.  He has a hx of CAD, HL, tobacco abuse.  He was admitted in 10/2102 with an inferior STEMI.  He had 90% mid RCA, 100% distal RCA and 80-90% prox LAD on LHC and was treated with overlapping DES to the dRCA and DES x 1 to the mRCA.  He underwent staged PCI of the LAD with DES during that admission.  He does have a small diagonal with high grade disease that is treated medically as the vessel is not amenable to PCI.  Echo 11/20/12: mild LVH, EF 55%, mild reduced RVSF.  He was seen by Dr. Verne Carrow in f/u 12/05/12.  He had significant fatigue and his beta blocker dose was reduced.  I saw him in f/u 12/25/12 and he continued to progress slowly.  He had stopped his beta blocker prior to seeing me and recommended he remain off of this as I thought this was the culprit for his symptoms.    He continues to feel fatigued.  However, his fatigue is improved off the beta blocker.  He denies chest pain.  No significant dyspnea.  No orthopnea, PND, edema.  No syncope.  He still does not feel like he has the stamina to return to work.  He notes his temper is often short.  He denies being depressed.  His wife is here with him today and she feels like his mood is depressed.  No SI.  She does note that he snores.  He admits to taking frequent naps.    Labs (5/14):  K 4.2, Cr 0.92, LDL 130, Hgb 12.4, TSH 0.924  Labs (6/14):  K 3.8, Cr 0.9, Hgb 14.2   Wt Readings from Last 3 Encounters:  01/15/13 253 lb (114.76 kg)  12/25/12 252 lb 6.4 oz (114.488 kg)  12/05/12 246 lb 12.8 oz (111.948 kg)     Past Medical History  Diagnosis Date  . Coronary artery disease     a.  10/2012 Inf STEMI/Cath/PCI: LM 20p, LAD 80-90p, 44m/d, D1 sm 99d, LCX mild plaque, RCA 100d (2.75x16 Promus Premier DES into PDA, 3.25x20 Promus Prem DES dRCA, 3.5x12 Promus Prem DES mRCA), EF 55-60%;  b. 10/2012 Staged PCI of LAD: 3.5x12 Promus Prem DES.  . Osgood-Schlatter's disease [phone]    "right knee" (11/29/2012)  . Nephrolithiasis     "passed on it's own" (Nov 29, 2012)  . Hyperlipidemia   . NSVT (nonsustained ventricular tachycardia)     a. in setting of MI 10/2012 - Echo: EF 55%, mild LVH.    Current Outpatient Prescriptions  Medication Sig Dispense Refill  . aspirin 81 MG chewable tablet Chew 1 tablet (81 mg total) by mouth daily.      [name] atorvastatin (LIPITOR) 80 MG tablet Take 1 tablet (80 mg total) by mouth daily at 6 PM.  30 tablet  6  . nitroGLYCERIN (NITROSTAT) 0.4 MG SL tablet Place 1 tablet (0.4 mg total) under the tongue every 5 (five) minutes x 3 doses as needed for chest pain.  25 tablet  3  . prasugrel (EFFIENT) 10 MG TABS Take 1 tablet (10 mg total) by mouth daily.  30 tablet  6   No current facility-administered medications for this visit.    Allergies:   No Known Allergies  Social History:  The patient  reports that he has quit smoking. His smoking use included Cigarettes. He has a 9 pack-year smoking history. He has never used smokeless tobacco. He reports that he does not drink alcohol or use illicit drugs.   ROS:  Please see the history of present illness.    All other systems reviewed and negative.   PHYSICAL EXAM: VS:  BP 140/81  Pulse 103  Ht 6' 3.5" (1.918 m)  Wt 253 lb (114.76 kg)  BMI 31.2 kg/m2 Well nourished, well developed, in no acute distress HEENT: normal Neck: no JVD Cardiac:  normal S1, S2; RRR; no murmur Lungs:  clear to auscultation bilaterally, no wheezing, rhonchi or rales Abd: soft, nontender, no hepatomegaly Ext: no edema Skin: warm and dry Neuro:  CNs 2-12 intact, no focal abnormalities noted  EKG:  Sinus tachy, HR 103, inf Q waves,  inf-lat TWI, no change from prior tracing.  ASSESSMENT AND PLAN:  1. Fatigue:  I suspect he is overall depressed.  He may need to consider SSRI Rx.  He sees his PCP next week and can discuss counseling vs medication at that time. He admits to being "scared."  We discussed how this is normal after a life changing event like an MI.  I have encouraged him to continue to increase his activity.  I have encouraged him to return to work.  He does snore and may have OSA as well.  Recent CBC with normal Hgb.   2. Snoring:  Refer for split night sleep study. 3. CAD:  No angina.  Continue DAPT, statin.  HR now elevated and BP is borderline.  Will try him on Cardizem CD 120 mg QD.  Hopefully he can tolerate this.  4. Hyperlipidemia:  Continue statin. 5. Tobacco Abuse: He has quit smoking.  6. Disposition:  F/u with Dr. Verne Carrow in 3 mos.  I have completed his disability paperwork today.  RTW date is 01/28/13.    Signed, Tereso Newcomer, PA-C  01/15/2013 4:15 PM

## 2013-01-15 NOTE — Telephone Encounter (Signed)
Sorry but I am not accepting new patients.

## 2013-01-15 NOTE — Patient Instructions (Addendum)
Your physician has recommended that you have a split night sleep study. This test records several body functions during sleep, including: brain activity, eye movement, oxygen and carbon dioxide blood levels, heart rate and rhythm, breathing rate and rhythm, the flow of air through your mouth and nose, snoring, body muscle movements, and chest and belly movement.  Start Cardizem CD 120mg  daily.  Your physician recommends that you schedule a follow-up appointment in: 3 months with Dr. Clifton James.

## 2013-01-15 NOTE — Telephone Encounter (Signed)
Pt's wife would like to know if you would accept him as a pt.  His wife is [employer].  Pt needs post hospital fup. He had a heart attack.

## 2013-01-16 NOTE — Telephone Encounter (Signed)
Pt aware/going to see Dr Duanne Guess

## 2013-01-24 ENCOUNTER — Encounter: Payer: Self-pay | Admitting: Family Medicine

## 2013-01-24 ENCOUNTER — Ambulatory Visit (INDEPENDENT_AMBULATORY_CARE_PROVIDER_SITE_OTHER): Payer: BC Managed Care – PPO | Admitting: Family Medicine

## 2013-01-24 VITALS — BP 124/82 | HR 89 | Temp 97.8°F | Ht 75.0 in | Wt 259.0 lb

## 2013-01-24 DIAGNOSIS — F4321 Adjustment disorder with depressed mood: Secondary | ICD-10-CM

## 2013-01-24 DIAGNOSIS — I2510 Atherosclerotic heart disease of native coronary artery without angina pectoris: Secondary | ICD-10-CM

## 2013-01-24 MED ORDER — SERTRALINE HCL 50 MG PO TABS: 50.0000 mg | ORAL_TABLET | Freq: Every day | ORAL | Status: DC

## 2013-01-24 NOTE — Progress Notes (Signed)
  Subjective:    Patient ID: Adam Haney, male    DOB: Jun 17, 1966, 47 y.o.   MRN: [id number]  HPI Patient is seen to establish care Patient presented to the emergency department April 2014 with diaphoresis he was diagnosed with inferior ST elevation MI.  He had 90% mid RCA lesion, 100% distal RCA lesion, an 80-90% proximal LAD lesion. He reports that he had 4 stents.  He currently takes several medications including diltiazem, atorvastatin, aspirin, and Effient.  He cannot tolerate beta blockers secondary to fatigue Patient quit smoking following his MI. He has lost several pounds following some dietary modification. Currently walking 2-1/2 miles daily and feels better than he has in several years.  He is, however having some issues with depressed mood. Increased irritability. He has some fear issues regarding going back to work. He denies any suicidal ideation. Some sleep disturbance.  Past Medical History  Diagnosis Date  . Coronary artery disease     a. 10/2012 Inf STEMI/Cath/PCI: LM 20p, LAD 80-90p, 29m/d, D1 sm 99d, LCX mild plaque, RCA 100d (2.75x16 Promus Premier DES into PDA, 3.25x20 Promus Prem DES dRCA, 3.5x12 Promus Prem DES mRCA), EF 55-60%;  b. 10/2012 Staged PCI of LAD: 3.5x12 Promus Prem DES.  . Osgood-Schlatter's disease [phone]    "right knee" (Dec 19, 2012)  . Nephrolithiasis     "passed on it's own" (2012-12-19)  . Hyperlipidemia   . NSVT (nonsustained ventricular tachycardia)     a. in setting of MI 10/2012 - Echo: EF 55%, mild LVH.   Past Surgical History  Procedure Laterality Date  . Back surgery  1995  . Lumbar disc surgery  1995    "L5-S1 ruptured disc" (Dec 19, 2012)  . Ulnar collateral ligament repair Left 2012    reports that he has quit smoking. His smoking use included Cigarettes. He has a 9 pack-year smoking history. He has never used smokeless tobacco. He reports that he does not drink alcohol or use illicit drugs. family history includes CAD in his mother. No  Known Allergies    Review of Systems  Constitutional: Negative for fever, chills, appetite change and unexpected weight change.  Respiratory: Negative for cough and shortness of breath.   Cardiovascular: Negative for chest pain, palpitations and leg swelling.  Gastrointestinal: Negative for nausea and vomiting.  Neurological: Negative for dizziness.  Psychiatric/Behavioral: Positive for dysphoric mood. Negative for suicidal ideas. The patient is nervous/anxious.        Objective:   Physical Exam  Constitutional: He is oriented to person, place, and time. He appears well-developed and well-nourished. No distress.  Neck: Neck supple. No thyromegaly present.  Cardiovascular: Normal rate and regular rhythm.   Pulmonary/Chest: Effort normal and breath sounds normal. No respiratory distress. He has no wheezes. He has no rales.  Musculoskeletal: He exhibits no edema.  Neurological: He is alert and oriented to person, place, and time. No cranial nerve deficit.  Psychiatric: He has a normal mood and affect. His behavior is normal. Judgment and thought content normal.          Assessment & Plan:  #1 adjustment disorder with depressed mood. We discussed options including counseling. Start sertraline 50 mg once daily. Reassess one month. Reviewed possible side effects. #2 history of CAD with recent inferior STEMI.  Currently stable. Patient has followup with cardiology later this month and plans to get followup lipid panel then.

## 2013-02-12 ENCOUNTER — Encounter (HOSPITAL_BASED_OUTPATIENT_CLINIC_OR_DEPARTMENT_OTHER): Payer: BC Managed Care – PPO

## 2013-02-18 ENCOUNTER — Telehealth: Payer: Self-pay | Admitting: Physician Assistant

## 2013-02-18 NOTE — Telephone Encounter (Signed)
lmom effient samples at the front desk. 

## 2013-02-18 NOTE — Telephone Encounter (Signed)
lmom effient samples at the front desk.

## 2013-02-18 NOTE — Telephone Encounter (Signed)
New problem  Per pts wife was told she could get samples of effient

## 2013-03-01 ENCOUNTER — Ambulatory Visit (HOSPITAL_BASED_OUTPATIENT_CLINIC_OR_DEPARTMENT_OTHER): Payer: BC Managed Care – PPO | Attending: Physician Assistant

## 2013-03-01 ENCOUNTER — Ambulatory Visit: Payer: BC Managed Care – PPO | Admitting: Family Medicine

## 2013-03-04 ENCOUNTER — Telehealth: Payer: Self-pay | Admitting: Cardiovascular Disease

## 2013-03-04 NOTE — Telephone Encounter (Signed)
New prob  Pt wife is requesting samples of Effient 10 MG. She said he is completely out and would be willing to come by and get them today.

## 2013-03-04 NOTE — Telephone Encounter (Signed)
Spoke with pt's wife and told her I would leave Effient samples at front desk to be picked up.  Effient 10 mg, 28 tablets, exp 8/15, lot [id number] left at front desk.

## 2013-03-06 ENCOUNTER — Ambulatory Visit: Payer: BC Managed Care – PPO | Admitting: Family Medicine

## 2013-03-06 DIAGNOSIS — Z0289 Encounter for other administrative examinations: Secondary | ICD-10-CM

## 2013-04-01 ENCOUNTER — Telehealth: Payer: Self-pay | Admitting: Cardiovascular Disease

## 2013-04-01 NOTE — Telephone Encounter (Signed)
Spoke with pt's wife and told her I would leave samples of Effient at front desk to be picked up. Effient 10 mg, 28 tablets, Lot [id number], exp 6/15 left at front desk.

## 2013-04-17 ENCOUNTER — Ambulatory Visit (INDEPENDENT_AMBULATORY_CARE_PROVIDER_SITE_OTHER): Payer: BC Managed Care – PPO | Admitting: Cardiovascular Disease

## 2013-04-17 ENCOUNTER — Encounter: Payer: Self-pay | Admitting: Cardiovascular Disease

## 2013-04-17 VITALS — BP 135/86 | HR 91 | Ht 75.0 in | Wt 263.0 lb

## 2013-04-17 DIAGNOSIS — I2510 Atherosclerotic heart disease of native coronary artery without angina pectoris: Secondary | ICD-10-CM

## 2013-04-17 NOTE — Patient Instructions (Addendum)
Your physician wants you to follow-up in:  Early May 2015 You will receive a reminder letter in the mail two months in advance. If you don't receive a letter, please call our office to schedule the follow-up appointment.

## 2013-04-17 NOTE — Progress Notes (Signed)
History of Present Illness: 47 yo male with history of CAD with inferior STEMI, tobacco abuse, HLD here today for cardiac follow up. He was admitted to [facility] 11/19/12 with inferior STEMI. Urgent cardiac cath 11/19/12 with 90% mid RCA stenosis, 100% distal RCA stenosis. The distal vessel was treated with overlapping drug eluting stents and the mid vessel was treated with a drug eluting stent. He was brought back 12/04/12 for staged PCI of the severe ostial LAD stenosis treated with a DES x 1. He had residual high grade disease in the small caliber diagonal branch which was felt to be too small for PCI. LVEF 55% by echo post cath.   He is here today for cardiac follow up. He is feeling well overall. No chest pain or SOB. No LE edema. He is back at work.  Primary Care Physician: None  Last Lipid Profile:Lipid Panel     Component Value Date/Time   CHOL 186 11/20/2012 0110   TRIG 85 11/20/2012 0110   HDL 39* 11/20/2012 0110   CHOLHDL 4.8 11/20/2012 0110   VLDL 17 11/20/2012 0110   LDLCALC 130* 11/20/2012 0110     Past Medical History  Diagnosis Date  . Coronary artery disease     a. 10/2012 Inf STEMI/Cath/PCI: LM 20p, LAD 80-90p, 6m/d, D1 sm 99d, LCX mild plaque, RCA 100d (2.75x16 Promus Premier DES into PDA, 3.25x20 Promus Prem DES dRCA, 3.5x12 Promus Prem DES mRCA), EF 55-60%;  b. 10/2012 Staged PCI of LAD: 3.5x12 Promus Prem DES.  . Osgood-Schlatter's disease [phone]    "right knee" (2012/12/04)  . Nephrolithiasis     "passed on it's own" (2012/12/04)  . Hyperlipidemia   . NSVT (nonsustained ventricular tachycardia)     a. in setting of MI 10/2012 - Echo: EF 55%, mild LVH.    Past Surgical History  Procedure Laterality Date  . Back surgery  1995  . Lumbar disc surgery  1995    "L5-S1 ruptured disc" (Dec 04, 2012)  . Ulnar collateral ligament repair Left 2012    Current Outpatient Prescriptions  Medication Sig Dispense Refill  . aspirin 81 MG chewable tablet Chew 1 tablet (81 mg total) by  mouth daily.      [name] atorvastatin (LIPITOR) 80 MG tablet Take 1 tablet (80 mg total) by mouth daily at 6 PM.  30 tablet  6  . diltiazem (CARDIZEM CD) 120 MG 24 hr capsule Take 1 capsule (120 mg total) by mouth daily.  90 capsule  3  . nitroGLYCERIN (NITROSTAT) 0.4 MG SL tablet Place 1 tablet (0.4 mg total) under the tongue every 5 (five) minutes x 3 doses as needed for chest pain.  25 tablet  3  . prasugrel (EFFIENT) 10 MG TABS Take 1 tablet (10 mg total) by mouth daily.  30 tablet  6  . sertraline (ZOLOFT) 50 MG tablet Take 1 tablet (50 mg total) by mouth daily.  30 tablet  5   No current facility-administered medications for this visit.    No Known Allergies  History   Social History  . Marital Status: Married    Spouse Name: N/A    Number of Children: N/A  . Years of Education: N/A   Occupational History  . Not on file.   Social History Main Topics  . Smoking status: Former Smoker -- 0.50 packs/day for 18 years    Types: Cigarettes  . Smokeless tobacco: Never Used     Comment: 04-Dec-2012 offered smoking sessation materials; pt declines  .  Alcohol Use: No  . Drug Use: No  . Sexual Activity: Yes   Other Topics Concern  . Not on file   Social History Narrative  . No narrative on file    Family History  Problem Relation Age of Onset  . CAD Mother     Review of Systems:  As stated in the HPI and otherwise negative.   BP 135/86  Pulse 91  Ht 6\' 3"  (1.905 m)  Wt 263 lb (119.296 kg)  BMI 32.87 kg/m2  Physical Examination: General: Well developed, well nourished, NAD HEENT: OP clear, mucus membranes moist SKIN: warm, dry. No rashes. Neuro: No focal deficits Musculoskeletal: Muscle strength 5/5 all ext Psychiatric: Mood and affect normal Neck: No JVD, no carotid bruits, no thyromegaly, no lymphadenopathy. Lungs:Clear bilaterally, no wheezes, rhonci, crackles Cardiovascular: Regular rate and rhythm. No murmurs, gallops or rubs. Abdomen:Soft. Bowel sounds present.  Non-tender.  Extremities: No lower extremity edema. Pulses are 2 + in the bilateral DP/PT  Cardiac cath 11/19/12: Left main: 20% proximal stenosis.  Left Anterior Descending Artery: Large caliber vessel that courses to the apex. 80-90% proximal stenosis. The mid and distal vessel has serial 30% stenoses. The first diagonal branch is small to moderate in caliber with 99% stenosis in the distal portion of the vessel where it becomes very small caliber (1.0 mm).  Circumflex Artery: Large caliber vessel with early moderate caliber intermediate branch with mild plaque. The continuation of the AV groove Circumflex is small to moderate in caliber with mild diffuse plaque.  Right Coronary Artery: Large dominant vessel with mild proximal plaque, 90% mid stenosis, 100% occlusion distal vessel before the bifurcation.  Left Ventricular Angiogram: LVEF 55-60%.   Assessment and Plan:   1. CAD: He is stable post MI. No chest pains. He is on ASA, Effient and statin.   2. Tobacco abuse: He has stopped smoking.

## 2013-05-14 ENCOUNTER — Telehealth: Payer: Self-pay

## 2013-05-14 NOTE — Telephone Encounter (Signed)
error 

## 2013-07-03 ENCOUNTER — Encounter: Payer: Self-pay | Admitting: *Deleted

## 2013-07-28 ENCOUNTER — Other Ambulatory Visit: Payer: Self-pay | Admitting: Family Medicine

## 2013-07-29 NOTE — Telephone Encounter (Deleted)
Last visit 01/24/13 Last refill 01/24/13 #30 5

## 2013-07-30 NOTE — Progress Notes (Signed)
This encounter was created in error - please disregard.

## 2013-09-03 ENCOUNTER — Telehealth: Payer: Self-pay | Admitting: *Deleted

## 2013-09-03 NOTE — Telephone Encounter (Signed)
Effient samples given to patients wife.

## 2013-09-23 ENCOUNTER — Telehealth: Payer: Self-pay | Admitting: *Deleted

## 2013-09-23 NOTE — Telephone Encounter (Signed)
Patients wife request effient samples. Samples provided.

## 2013-10-31 ENCOUNTER — Telehealth: Payer: Self-pay

## 2013-10-31 NOTE — Telephone Encounter (Signed)
Patient 's wife got samples of effient for the patient

## 2013-11-08 ENCOUNTER — Encounter: Payer: Self-pay | Admitting: Cardiovascular Disease

## 2013-11-08 ENCOUNTER — Ambulatory Visit (INDEPENDENT_AMBULATORY_CARE_PROVIDER_SITE_OTHER): Payer: BC Managed Care – PPO | Admitting: Cardiovascular Disease

## 2013-11-08 VITALS — BP 140/88 | HR 85 | Ht 74.0 in | Wt 266.0 lb

## 2013-11-08 DIAGNOSIS — Z720 Tobacco use: Secondary | ICD-10-CM

## 2013-11-08 DIAGNOSIS — E785 Hyperlipidemia, unspecified: Secondary | ICD-10-CM

## 2013-11-08 DIAGNOSIS — I2510 Atherosclerotic heart disease of native coronary artery without angina pectoris: Secondary | ICD-10-CM

## 2013-11-08 DIAGNOSIS — F17200 Nicotine dependence, unspecified, uncomplicated: Secondary | ICD-10-CM

## 2013-11-08 LAB — HEPATIC FUNCTION PANEL
ALT: 26 U/L (ref 0–53)
AST: 22 U/L (ref 0–37)
Albumin: 3.7 g/dL (ref 3.5–5.2)
Alkaline Phosphatase: 90 U/L (ref 39–117)
Bilirubin, Direct: 0.1 mg/dL (ref 0.0–0.3)
Total Bilirubin: 0.8 mg/dL (ref 0.3–1.2)
Total Protein: 7.2 g/dL (ref 6.0–8.3)

## 2013-11-08 LAB — LIPID PANEL
Cholesterol: 223 mg/dL — ABNORMAL HIGH (ref 0–200)
HDL: 35.5 mg/dL — ABNORMAL LOW (ref >39.00)
LDL Cholesterol: 161 mg/dL — ABNORMAL HIGH (ref 0–99)
Total CHOL/HDL Ratio: 6
Triglycerides: 132 mg/dL (ref 0.0–149.0)
VLDL: 26.4 mg/dL (ref 0.0–40.0)

## 2013-11-08 MED ORDER — CLOPIDOGREL BISULFATE 75 MG PO TABS
75.0000 mg | ORAL_TABLET | Freq: Every day | ORAL | Status: DC
Start: 2013-11-08 — End: 2014-11-06

## 2013-11-08 MED ORDER — METOPROLOL TARTRATE 25 MG PO TABS: 25.0000 mg | ORAL_TABLET | Freq: Two times a day (BID) | ORAL | Status: DC

## 2013-11-08 NOTE — Patient Instructions (Signed)
Your physician wants you to follow-up in:  6 months. You will receive a reminder letter in the mail two months in advance. If you don't receive a letter, please call our office to schedule the follow-up appointment.  Your physician has recommended you make the following change in your medication:   Stop Effient.  Start Clopidogrel 75 mg by mouth daily. Resume metoprolol tartrate 25 mg by mouth twice daily.

## 2013-11-08 NOTE — Progress Notes (Signed)
History of Present Illness: 48 yo male with history of CAD with inferior STEMI, tobacco abuse, HLD here today for cardiac follow up. He was [facility]12 with inferior STEMI. Urgent cardiac cath 11/19/12 with 90% mid RCA stenosis, 100% distal RCA stenosis. The distal vessel was treated with overlapping drug eluting stents and the mid vessel was treated with a drug eluting stent. He was brought back 11/21/12 for staged PCI of the severe ostial LAD stenosis treated with a DES x 1. He had residual high grade disease in the small caliber diagonal branch which was felt to be too small for PCI. LVEF 55% by echo post cath.   He is here today for cardiac follow up. He is feeling well overall. No chest pain or SOB. No LE edema. He is back at work. He has stopped smoking.   Primary Care Physician: None  Last Lipid Profile:Lipid Panel     Component Value Date/Time   CHOL 186 11/20/2012 0110   TRIG 85 11/20/2012 0110   HDL 39* 11/20/2012 0110   CHOLHDL 4.8 11/20/2012 0110   VLDL 17 11/20/2012 0110   LDLCALC 130* 11/20/2012 0110     Past Medical History  Diagnosis Date  . Coronary artery disease     a. 10/2012 Inf STEMI/Cath/PCI: LM 20p, LAD 80-90p, 1619m/d, D1 sm 99d, LCX mild plaque, RCA 100d (2.75x16 Promus Premier DES into PDA, 3.25x20 Promus Prem DES dRCA, 3.5x12 Promus Prem DES mRCA), EF 55-60%;  b. 10/2012 Staged PCI of LAD: 3.5x12 Promus Prem DES.  . Osgood-Schlatter's disease [phone]    "right knee" (11/21/2012)  . Nephrolithiasis     "passed on it's own" (11/21/2012)  . Hyperlipidemia   . NSVT (nonsustained ventricular tachycardia)     a. in setting of MI 10/2012 - Echo: EF 55%, mild LVH.    Past Surgical History  Procedure Laterality Date  . Back surgery  1995  . Lumbar disc surgery  1995    "L5-S1 ruptured disc" (11/21/2012)  . Ulnar collateral ligament repair Left 2012    Current Outpatient Prescriptions  Medication Sig Dispense Refill  . aspirin 81 MG chewable tablet Chew 1  tablet (81 mg total) by mouth daily.      [name]. atorvastatin (LIPITOR) 80 MG tablet Take 1 tablet (80 mg total) by mouth daily at 6 PM.  30 tablet  6  . nitroGLYCERIN (NITROSTAT) 0.4 MG SL tablet Place 1 tablet (0.4 mg total) under the tongue every 5 (five) minutes x 3 doses as needed for chest pain.  25 tablet  3  . prasugrel (EFFIENT) 10 MG TABS Take 1 tablet (10 mg total) by mouth daily.  30 tablet  6  . sertraline (ZOLOFT) 50 MG tablet TAKE 1 TABLET (50 MG TOTAL) BY MOUTH DAILY.  30 tablet  5   No current facility-administered medications for this visit.    No Known Allergies  History   Social History  . Marital Status: Married    Spouse Name: N/A    Number of Children: N/A  . Years of Education: N/A   Occupational History  . Not on file.   Social History Main Topics  . Smoking status: Former Smoker -- 0.50 packs/day for 18 years    Types: Cigarettes  . Smokeless tobacco: Never Used     Comment: 11/21/2012 offered smoking sessation materials; pt declines  . Alcohol Use: No  . Drug Use: No  . Sexual Activity: Yes   Other Topics Concern  .  Not on file   Social History Narrative  . No narrative on file    Family History  Problem Relation Age of Onset  . CAD Mother     Review of Systems:  As stated in the HPI and otherwise negative.   BP 140/88  Pulse 85  Ht 6\' 2"  (1.88 m)  Wt 266 lb (120.657 kg)  BMI 34.14 kg/m2  Physical Examination: General: Well developed, well nourished, NAD HEENT: OP clear, mucus membranes moist SKIN: warm, dry. No rashes. Neuro: No focal deficits Musculoskeletal: Muscle strength 5/5 all ext Psychiatric: Mood and affect normal Neck: No JVD, no carotid bruits, no thyromegaly, no lymphadenopathy. Lungs:Clear bilaterally, no wheezes, rhonci, crackles Cardiovascular: Regular rate and rhythm. No murmurs, gallops or rubs. Abdomen:Soft. Bowel sounds present. Non-tender.  Extremities: No lower extremity edema. Pulses are 2 + in the bilateral  DP/PT  Cardiac cath 11/19/12: Left main: 20% proximal stenosis.  Left Anterior Descending Artery: Large caliber vessel that courses to the apex. 80-90% proximal stenosis. The mid and distal vessel has serial 30% stenoses. The first diagonal branch is small to moderate in caliber with 99% stenosis in the distal portion of the vessel where it becomes very small caliber (1.0 mm).  Circumflex Artery: Large caliber vessel with early moderate caliber intermediate branch with mild plaque. The continuation of the AV groove Circumflex is small to moderate in caliber with mild diffuse plaque.  Right Coronary Artery: Large dominant vessel with mild proximal plaque, 90% mid stenosis, 100% occlusion distal vessel before the bifurcation.  Left Ventricular Angiogram: LVEF 55-60%.   Assessment and Plan:   1. CAD: He is stable post MI. No chest pains. He is on ASA, Effient and statin. Based on recent findings of DAPT trial, will continue dual anti-platelet therapy. Will stop Effient and start Plavix 75 mg po Qdaily. Will restart Lopressor 25 mg po BID.   2. Tobacco abuse: He has stopped smoking.   3. Hyperlipidemia: Continue statin. Repeat lipids and LFTs today.

## 2013-11-14 ENCOUNTER — Other Ambulatory Visit: Payer: Self-pay | Admitting: *Deleted

## 2013-11-14 ENCOUNTER — Telehealth: Payer: Self-pay | Admitting: *Deleted

## 2013-11-14 DIAGNOSIS — E785 Hyperlipidemia, unspecified: Secondary | ICD-10-CM

## 2013-11-14 MED ORDER — ROSUVASTATIN CALCIUM 40 MG PO TABS: 40.0000 mg | ORAL_TABLET | Freq: Every day | ORAL | Status: DC

## 2013-11-14 NOTE — Telephone Encounter (Signed)
Spoke with pt and reviewed lab results with him. He reports when he was here for recent office visit he was started on metoprolol tartrate 25 mg by mouth twice daily.  He had been on Cardizem CD 120 mg by mouth daily prior to office visit but had been out for 2 days so it was not on his med list. He reports he was changed from metoprolol to Cardizem CD last year. Chart reviewed and this was in June of last year and change was made due to fatigue.  He states he has been taking metoprolol since recent office visit and is tolerating this.  He will continue metoprolol and let us know if he develops fatigue.

## 2013-11-15 NOTE — Progress Notes (Signed)
Patient ID: Adam Haney, male   DOB: [birth year], 48 y.o.   MRN: [id number] Gave patient's wife samples of crestor

## 2013-11-15 NOTE — Telephone Encounter (Signed)
Thanks. cdm 

## 2013-11-26 ENCOUNTER — Other Ambulatory Visit: Payer: Self-pay

## 2013-11-26 NOTE — Telephone Encounter (Signed)
Patient 's wife picked up samples of crestor 20 mg

## 2013-12-23 ENCOUNTER — Telehealth: Payer: Self-pay | Admitting: *Deleted

## 2013-12-23 NOTE — Telephone Encounter (Signed)
Crestor samples provided to patients wife for patient. 

## 2014-01-06 ENCOUNTER — Telehealth: Payer: Self-pay | Admitting: *Deleted

## 2014-01-06 NOTE — Telephone Encounter (Signed)
Patients wife requests crestor samples for patient. Samples provided.

## 2014-02-03 ENCOUNTER — Telehealth: Payer: Self-pay

## 2014-02-03 NOTE — Telephone Encounter (Signed)
Patient's wife got samples of crestor 20 mg, gave to wife

## 2014-02-04 ENCOUNTER — Other Ambulatory Visit: Payer: BC Managed Care – PPO

## 2014-03-04 ENCOUNTER — Telehealth: Payer: Self-pay

## 2014-03-04 NOTE — Telephone Encounter (Signed)
Gave wife Claudina LickChristy Sardo samples of crestor 10 mg samples , We did not have 20 mg so I told him to take 4 tablets of the crestor 10 mg. The patient understood

## 2014-03-21 ENCOUNTER — Telehealth: Payer: Self-pay

## 2014-03-21 NOTE — Telephone Encounter (Signed)
Gave samples to wife(Christy Chestine Spore) for patient

## 2014-04-22 ENCOUNTER — Telehealth: Payer: Self-pay

## 2014-04-22 NOTE — Telephone Encounter (Signed)
Patient's wife asked for samples of crestor 20 mg I gave her 3 weeks worth of samples

## 2014-05-08 ENCOUNTER — Telehealth: Payer: Self-pay | Admitting: *Deleted

## 2014-05-08 NOTE — Telephone Encounter (Signed)
Crestor samples given to wife for patient. 

## 2014-06-06 ENCOUNTER — Telehealth: Payer: Self-pay | Admitting: *Deleted

## 2014-06-06 NOTE — Telephone Encounter (Signed)
Crestor samples provided to wife for patient.

## 2014-06-30 ENCOUNTER — Other Ambulatory Visit: Payer: BC Managed Care – PPO | Admitting: *Deleted

## 2014-06-30 ENCOUNTER — Encounter: Payer: Self-pay | Admitting: *Deleted

## 2014-06-30 DIAGNOSIS — E785 Hyperlipidemia, unspecified: Secondary | ICD-10-CM

## 2014-07-03 ENCOUNTER — Encounter (HOSPITAL_COMMUNITY): Payer: Self-pay | Admitting: Cardiovascular Disease

## 2014-07-07 ENCOUNTER — Telehealth: Payer: Self-pay | Admitting: *Deleted

## 2014-07-07 NOTE — Telephone Encounter (Signed)
Crestor samples given to patients wife for patient.

## 2014-07-24 ENCOUNTER — Telehealth: Payer: Self-pay | Admitting: *Deleted

## 2014-07-24 NOTE — Telephone Encounter (Signed)
Pt's wife requesting samples of Crestor. Crestor 20 mg, 14 tablets, Lot [id number], exp 5/18 left at front desk.  Pt's wife notified pt needs to have repeat lab work and is due for follow up with Dr. Clifton JamesMcAlhany.

## 2014-07-30 ENCOUNTER — Telehealth: Payer: Self-pay | Admitting: *Deleted

## 2014-07-30 NOTE — Telephone Encounter (Signed)
Crestor samples given to wife for patient. 

## 2014-08-04 ENCOUNTER — Other Ambulatory Visit (INDEPENDENT_AMBULATORY_CARE_PROVIDER_SITE_OTHER): Payer: BLUE CROSS/BLUE SHIELD | Admitting: *Deleted

## 2014-08-04 DIAGNOSIS — E785 Hyperlipidemia, unspecified: Secondary | ICD-10-CM

## 2014-08-04 LAB — HEPATIC FUNCTION PANEL
ALBUMIN: 3.6 g/dL (ref 3.5–5.2)
ALK PHOS: 81 U/L (ref 39–117)
ALT: 19 U/L (ref 0–53)
AST: 14 U/L (ref 0–37)
BILIRUBIN TOTAL: 0.7 mg/dL (ref 0.2–1.2)
Bilirubin, Direct: 0 mg/dL (ref 0.0–0.3)
TOTAL PROTEIN: 7.2 g/dL (ref 6.0–8.3)

## 2014-08-04 LAB — LIPID PANEL
CHOL/HDL RATIO: 4
Cholesterol: 138 mg/dL (ref 0–200)
HDL: 36.4 mg/dL — ABNORMAL LOW (ref >39.00)
LDL Cholesterol: 84 mg/dL (ref 0–99)
NonHDL: 101.6
Triglycerides: 88 mg/dL (ref 0.0–149.0)
VLDL: 17.6 mg/dL (ref 0.0–40.0)

## 2014-08-05 ENCOUNTER — Ambulatory Visit (INDEPENDENT_AMBULATORY_CARE_PROVIDER_SITE_OTHER): Payer: BLUE CROSS/BLUE SHIELD | Admitting: Cardiovascular Disease

## 2014-08-05 ENCOUNTER — Encounter: Payer: Self-pay | Admitting: Cardiovascular Disease

## 2014-08-05 VITALS — BP 110/70 | HR 76 | Ht 74.0 in | Wt 271.6 lb

## 2014-08-05 DIAGNOSIS — F17201 Nicotine dependence, unspecified, in remission: Secondary | ICD-10-CM

## 2014-08-05 DIAGNOSIS — E785 Hyperlipidemia, unspecified: Secondary | ICD-10-CM

## 2014-08-05 DIAGNOSIS — I2510 Atherosclerotic heart disease of native coronary artery without angina pectoris: Secondary | ICD-10-CM

## 2014-08-05 NOTE — Patient Instructions (Signed)
Your physician wants you to follow-up in:  6 months. You will receive a reminder letter in the mail two months in advance. If you don't receive a letter, please call our office to schedule the follow-up appointment.  Your physician has requested that you have an exercise tolerance test. For further information please visit [email or web address]. Please also follow instruction sheet, as given.    

## 2014-08-05 NOTE — Progress Notes (Signed)
History of Present Illness: 49 yo Haney with history of CAD with inferior STEMI, tobacco abuse, HLD here today for cardiac follow up. He was admitted to [facility] 11/19/12 with inferior STEMI. Urgent cardiac cath 11/19/12 with 90% mid RCA stenosis, 100% distal RCA stenosis. The distal vessel was treated with overlapping drug eluting stents and the mid vessel was treated with a drug eluting stent. He was brought back 14-Dec-2012 for staged PCI of the severe ostial LAD stenosis treated with a DES x 1. He had residual high grade disease in the small caliber diagonal branch which was felt to be too small for PCI. LVEF 55% by echo post cath.   He is here today for cardiac follow up. He is feeling well overall. No chest pain or SOB. No LE edema. He is now exercising every day for last week. Has changed his diet. Has not started back smoking.    Primary Care Physician: Evelena Peat  Last Lipid Profile:Lipid Panel     Component Value Date/Time   CHOL 138 08/04/2014 0811   TRIG 88.0 08/04/2014 0811   HDL 36.40* 08/04/2014 0811   CHOLHDL 4 08/04/2014 0811   VLDL 17.6 08/04/2014 0811   LDLCALC 84 08/04/2014 0811     Past Medical History  Diagnosis Date  . Coronary artery disease     a. 10/2012 Inf STEMI/Cath/PCI: LM 20p, LAD 80-90p, 54m/d, D1 sm 99d, LCX mild plaque, RCA 100d (2.75x16 Promus Premier DES into PDA, 3.25x20 Promus Prem DES dRCA, 3.5x12 Promus Prem DES mRCA), EF 55-60%;  b. 10/2012 Staged PCI of LAD: 3.5x12 Promus Prem DES.  . Osgood-Schlatter's disease [phone]    "right knee" (12/14/12)  . Nephrolithiasis     "passed on it's own" (Dec 14, 2012)  . Hyperlipidemia   . NSVT (nonsustained ventricular tachycardia)     a. in setting of MI 10/2012 - Echo: EF 55%, mild LVH.    Past Surgical History  Procedure Laterality Date  . Back surgery  1995  . Lumbar disc surgery  1995    "L5-S1 ruptured disc" (2012/12/14)  . Ulnar collateral ligament repair Left 2012  . Left heart catheterization with  coronary angiogram N/A 11/19/2012    Procedure: LEFT HEART CATHETERIZATION WITH CORONARY ANGIOGRAM;  Surgeon: Kathleene Hazel, MD;  Location: [facility] CATH LAB;  Service: Cardiovascular;  Laterality: N/A;  . Percutaneous coronary stent intervention (pci-s) N/A 2012/12/14    Procedure: PERCUTANEOUS CORONARY STENT INTERVENTION (PCI-S);  Surgeon: Kathleene Hazel, MD;  Location: [facility] CATH LAB;  Service: Cardiovascular;  Laterality: N/A;    Current Outpatient Prescriptions  Medication Sig Dispense Refill  . aspirin 81 MG chewable tablet Chew 1 tablet (81 mg total) by mouth daily.    . clopidogrel (PLAVIX) 75 MG tablet Take 1 tablet (75 mg total) by mouth daily. 30 tablet 11  . metoprolol tartrate (LOPRESSOR) 25 MG tablet Take 1 tablet (25 mg total) by mouth 2 (two) times daily. 60 tablet 11  . nitroGLYCERIN (NITROSTAT) 0.4 MG SL tablet Place 1 tablet (0.4 mg total) under the tongue every 5 (five) minutes x 3 doses as needed for chest pain. 25 tablet 3  . rosuvastatin (CRESTOR) 40 MG tablet Take 1 tablet (40 mg total) by mouth daily. 90 tablet 3   No current facility-administered medications for this visit.    No Known Allergies  History   Social History  . Marital Status: Married    Spouse Name: N/A    Number of Children: N/A  .  Years of Education: N/A   Occupational History  . Not on file.   Social History Main Topics  . Smoking status: Former Smoker -- 0.50 packs/day for 18 years    Types: Cigarettes  . Smokeless tobacco: Never Used     Comment: 11/21/2012 offered smoking sessation materials; pt declines  . Alcohol Use: No  . Drug Use: No  . Sexual Activity: Yes   Other Topics Concern  . Not on file   Social History Narrative    Family History  Problem Relation Age of Onset  . CAD Mother     Review of Systems:  As stated in the HPI and otherwise negative.   BP 110/70 mmHg  Pulse 76  Ht 6\' 2"  (1.88 m)  Wt 271 lb 9.6 oz (123.197 kg)  BMI 34.86 kg/m2  SpO2  96%  Physical Examination: General: Well developed, well nourished, NAD HEENT: OP clear, mucus membranes moist SKIN: warm, dry. No rashes. Neuro: No focal deficits Musculoskeletal: Muscle strength 5/5 all ext Psychiatric: Mood and affect normal Neck: No JVD, no carotid bruits, no thyromegaly, no lymphadenopathy. Lungs:Clear bilaterally, no wheezes, rhonci, crackles Cardiovascular: Regular rate and rhythm. No murmurs, gallops or rubs. Abdomen:Soft. Bowel sounds present. Non-tender.  Extremities: No lower extremity edema. Pulses are 2 + in the bilateral DP/PT  Cardiac cath 11/19/12: Left main: 20% proximal stenosis.  Left Anterior Descending Artery: Large caliber vessel that courses to the apex. 80-90% proximal stenosis. The mid and distal vessel has serial 30% stenoses. The first diagonal branch is small to moderate in caliber with 99% stenosis in the distal portion of the vessel where it becomes very small caliber (1.0 mm).  Circumflex Artery: Large caliber vessel with early moderate caliber intermediate branch with mild plaque. The continuation of the AV groove Circumflex is small to moderate in caliber with mild diffuse plaque.  Right Coronary Artery: Large dominant vessel with mild proximal plaque, 90% mid stenosis, 100% occlusion distal vessel before the bifurcation.  Left Ventricular Angiogram: LVEF 55-60%.   Assessment and Plan:   1. CAD: Stable. No chest pains. He is on ASA, Plavix, beta blocker and statin.  Will arrange exercise stress myoview to assess exercise tolerance, exclude ischemic changes since it has been almost two years since his MI.   2. Tobacco abuse, in remission: He has stopped smoking.   3. Hyperlipidemia: Continue statin. LDL is not at goal (83 on 7/25/361/11/Adam) but he is now exercising every day and has changed his diet. Repeat lipids at next appt in 6 months.

## 2014-08-22 ENCOUNTER — Telehealth: Payer: Self-pay | Admitting: *Deleted

## 2014-08-22 NOTE — Telephone Encounter (Signed)
Crestor samples given to patients wife for patient. 

## 2014-09-15 ENCOUNTER — Ambulatory Visit (INDEPENDENT_AMBULATORY_CARE_PROVIDER_SITE_OTHER): Payer: BLUE CROSS/BLUE SHIELD | Admitting: Physician Assistant

## 2014-09-15 DIAGNOSIS — I2510 Atherosclerotic heart disease of native coronary artery without angina pectoris: Secondary | ICD-10-CM

## 2014-09-15 NOTE — Progress Notes (Signed)
[name], I arranged this just for screening. I don't think a myoview is necessary but do you think he needs lower ext ABI/dopplers? I hadn't heard from him any complaints of leg pain/claudication in the past. Adam Haney

## 2014-09-15 NOTE — Progress Notes (Signed)
Exercise Treadmill Test  Pre-Exercise Testing Evaluation Rhythm: normal sinus  Rate: 75     Test  Exercise Tolerance Test Ordering MD: Melene Mullerhristopher McAlhaney, MD  Interpreting MD: Tereso NewcomerScott Kymia Simi, PA-C  Unique Test No: 1  Treadmill:  1  Indication for ETT: known ASHD  Contraindication to ETT: No   Stress Modality: exercise - treadmill  Cardiac Imaging Performed: non   Protocol: standard Bruce - maximal  Max BP:  163/79  Max MPHR (bpm):  173 85% MPR (bpm):  147  MPHR obtained (bpm):  131 % MPHR obtained:  76  Reached 85% MPHR (min:sec):  n/a Total Exercise Time (min-sec):  6:49  Workload in METS:  8.2 Borg Scale: 14  Reason ETT Terminated:  leg cramps - patient jumped off treadmill    ST Segment Analysis At Rest: normal ST segments - no evidence of significant ST depression With Exercise: non-specific ST changes  Other Information Arrhythmia:  No Angina during ETT:  absent (0) Quality of ETT:  non-diagnostic  ETT Interpretation:  normal - no evidence of ischemia by ST analysis at submaximal exercise  Comments: Fair exercise capacity. No chest pain. Patient stopped walking due to bilateral calf pain ("burning"). Normal BP response to exercise. No ST changes to suggest ischemia at submaximal exercise.[name].   Recommendations: Will review with Dr. Verne Carrowhristopher McAlhany whether nuclear study needed as patient did not achieve target HR.   Of note, he did take his beta blocker today. Signed,  Tereso NewcomerScott Airik Goodlin, PA-C   09/15/2014 9:38 AM

## 2014-09-16 ENCOUNTER — Telehealth: Payer: Self-pay | Admitting: Physician Assistant

## 2014-09-16 DIAGNOSIS — M79661 Pain in right lower leg: Secondary | ICD-10-CM

## 2014-09-16 DIAGNOSIS — M79662 Pain in left lower leg: Secondary | ICD-10-CM

## 2014-09-16 DIAGNOSIS — Z720 Tobacco use: Secondary | ICD-10-CM

## 2014-09-16 NOTE — Telephone Encounter (Signed)
Discussed ETT with Dr. Verne Carrowhristopher McAlhany. He does not need any other type of stress test. We are concerned about his leg pain.  We don't see that he has had ABIs in the past. Schedule ABIs/LE arterial dopplers for leg pain. Tereso NewcomerScott Robbie Rideaux, PA-C   09/16/2014 10:07 AM

## 2014-09-16 NOTE — Progress Notes (Signed)
Ok.  Yes, I think it probably is a good idea to get ABIs.  I don't see that he has had these done in the past. I will get them scheduled. Tereso NewcomerScott Lonette Stevison, PA-C   09/16/2014 10:04 AM

## 2014-09-16 NOTE — Telephone Encounter (Signed)
pt notified per Bing NeighborsScott W. PA and Dr. Clifton JamesMcAlhany to schedule LEA/ABI for calf pain. Pt states is more of a burning pain in calf when he walks. Pt aware office will today to schedule. Pt said thank you.

## 2014-09-16 NOTE — Addendum Note (Signed)
Addended by: Tarri FullerFIATO, CAROL M on: 09/16/2014 11:52 AM   Modules accepted: Orders

## 2014-09-22 ENCOUNTER — Encounter (HOSPITAL_COMMUNITY): Payer: BLUE CROSS/BLUE SHIELD

## 2014-10-03 ENCOUNTER — Encounter (HOSPITAL_COMMUNITY): Payer: BLUE CROSS/BLUE SHIELD

## 2014-10-08 ENCOUNTER — Telehealth: Payer: Self-pay | Admitting: *Deleted

## 2014-10-08 NOTE — Telephone Encounter (Signed)
Crestor samples given to patients wife for patient. 

## 2014-10-09 ENCOUNTER — Ambulatory Visit (HOSPITAL_COMMUNITY): Payer: BLUE CROSS/BLUE SHIELD | Attending: Physician Assistant | Admitting: Cardiology

## 2014-10-09 DIAGNOSIS — I739 Peripheral vascular disease, unspecified: Secondary | ICD-10-CM | POA: Insufficient documentation

## 2014-10-09 DIAGNOSIS — Z720 Tobacco use: Secondary | ICD-10-CM

## 2014-10-09 DIAGNOSIS — M79662 Pain in left lower leg: Secondary | ICD-10-CM | POA: Diagnosis not present

## 2014-10-09 DIAGNOSIS — F17200 Nicotine dependence, unspecified, uncomplicated: Secondary | ICD-10-CM | POA: Insufficient documentation

## 2014-10-09 DIAGNOSIS — M79661 Pain in right lower leg: Secondary | ICD-10-CM | POA: Diagnosis not present

## 2014-10-09 NOTE — Progress Notes (Signed)
Lower arterial Doppler performed  

## 2014-10-10 ENCOUNTER — Telehealth: Payer: Self-pay | Admitting: *Deleted

## 2014-10-10 ENCOUNTER — Encounter: Payer: Self-pay | Admitting: Physician Assistant

## 2014-10-10 NOTE — Telephone Encounter (Signed)
pt notified about ABI normal. Pt verbalized understanding to results given today

## 2014-11-06 ENCOUNTER — Other Ambulatory Visit: Payer: Self-pay | Admitting: Cardiovascular Disease

## 2014-11-17 ENCOUNTER — Telehealth: Payer: Self-pay | Admitting: *Deleted

## 2014-11-17 ENCOUNTER — Other Ambulatory Visit: Payer: Self-pay | Admitting: Cardiovascular Disease

## 2014-11-17 ENCOUNTER — Other Ambulatory Visit: Payer: Self-pay | Admitting: *Deleted

## 2014-11-17 MED ORDER — ROSUVASTATIN CALCIUM 40 MG PO TABS: 40.0000 mg | ORAL_TABLET | Freq: Every day | ORAL | Status: DC

## 2014-11-17 NOTE — Telephone Encounter (Signed)
Spoke with patient's wife regarding Crestor. Provided with 30 day free trial card. Discussed with Dr. Tenny Crawoss (DOD) New order placed for Crestor 40 mg, since prior order had expired.

## 2014-12-06 ENCOUNTER — Other Ambulatory Visit: Payer: Self-pay | Admitting: Cardiovascular Disease

## 2015-03-11 ENCOUNTER — Other Ambulatory Visit: Payer: Self-pay | Admitting: *Deleted

## 2015-03-11 MED ORDER — CLOPIDOGREL BISULFATE 75 MG PO TABS: 75.0000 mg | ORAL_TABLET | Freq: Every day | ORAL | Status: DC

## 2015-06-10 ENCOUNTER — Other Ambulatory Visit: Payer: Self-pay

## 2015-07-07 ENCOUNTER — Other Ambulatory Visit: Payer: Self-pay

## 2015-07-07 NOTE — Telephone Encounter (Signed)
Should have enough tablets until next week. Tried calling pt home phone number to get a follow up appt scheduled. No answer. Just kept ringing.   Medication Detail      Disp Refills Start End     clopidogrel (PLAVIX) 75 MG tablet 30 tablet 3 03/11/2015     Sig - Route: Take 1 tablet (75 mg total) by mouth daily. - Oral    Notes to Pharmacy: Please call office to schedule appt    E-Prescribing Status: Receipt confirmed by pharmacy (03/11/2015 9:29 AM EDT)     Pharmacy    [facility] [place] - [phone]

## 2015-07-14 ENCOUNTER — Telehealth: Payer: Self-pay | Admitting: Cardiovascular Disease

## 2015-07-14 MED ORDER — CLOPIDOGREL BISULFATE 75 MG PO TABS: 75.0000 mg | ORAL_TABLET | Freq: Every day | ORAL | Status: DC

## 2015-07-14 NOTE — Telephone Encounter (Signed)
New Message   *STAT* If patient is at the pharmacy, call can be transferred to refill team.   1. Which medications need to be refilled? (please list name of each medication and dose if known) clopidogrel (PLAVIX) 75 MG tablet  2. Which pharmacy/location (including street and city if local pharmacy) is medication to be sent to? [facility]  [phone]  3. Do they need a 30 day or 90 day supply? 30  Pt is currently out.

## 2015-07-14 NOTE — Telephone Encounter (Signed)
Pt's Rx was sent to pt's pharmacy requested. Confirmation received. °

## 2015-08-07 ENCOUNTER — Other Ambulatory Visit: Payer: Self-pay

## 2015-08-07 MED ORDER — METOPROLOL TARTRATE 25 MG PO TABS: 25.0000 mg | ORAL_TABLET | Freq: Two times a day (BID) | ORAL | Status: DC

## 2015-08-07 MED ORDER — CLOPIDOGREL BISULFATE 75 MG PO TABS: 75.0000 mg | ORAL_TABLET | Freq: Every day | ORAL | Status: DC

## 2015-08-18 ENCOUNTER — Telehealth: Payer: Self-pay | Admitting: Cardiovascular Disease

## 2015-08-18 NOTE — Telephone Encounter (Signed)
spoke to pt , he will move crestor to [facility] as he has active refills with [facility], also he will make an appt as he is over due and needs to be seen, plavix was sent in 08/07/15 to [facility]

## 2015-08-18 NOTE — Telephone Encounter (Signed)
Follow Up  Pt wife is calling to follow Up on the medication: plavix , crestor  (want generic)  Please call back to disucss.

## 2015-08-18 NOTE — Telephone Encounter (Signed)
New message      *STAT* If patient is at the pharmacy, call can be transferred to refill team.   1. Which medications need to be refilled? (please list name of each medication and dose if known) plavix , crestor   (want generic) 2. Which pharmacy/location (including street and city if local pharmacy) is medication to be sent to? [facility]---[address]  3. Do they need a 30 day or 90 day supply? 90 day Pt took last pill this am

## 2015-09-28 ENCOUNTER — Ambulatory Visit (INDEPENDENT_AMBULATORY_CARE_PROVIDER_SITE_OTHER): Payer: BLUE CROSS/BLUE SHIELD | Admitting: Cardiovascular Disease

## 2015-09-28 ENCOUNTER — Encounter: Payer: Self-pay | Admitting: Cardiovascular Disease

## 2015-09-28 VITALS — BP 110/84 | HR 86 | Ht 74.0 in | Wt 270.8 lb

## 2015-09-28 DIAGNOSIS — E785 Hyperlipidemia, unspecified: Secondary | ICD-10-CM | POA: Diagnosis not present

## 2015-09-28 DIAGNOSIS — I2510 Atherosclerotic heart disease of native coronary artery without angina pectoris: Secondary | ICD-10-CM

## 2015-09-28 MED ORDER — NITROGLYCERIN 0.4 MG SL SUBL: 0.4000 mg | SUBLINGUAL_TABLET | SUBLINGUAL | Status: DC | PRN

## 2015-09-28 NOTE — Progress Notes (Signed)
Chief Complaint  Patient presents with  . Follow-up  . Coronary Artery Disease      History of Present Illness: 50 yo male with history of CAD with inferior STEMI, tobacco abuse, HLD here today for cardiac follow up. He was [facility]/12 with inferior STEMI. Urgent cardiac cath 11/19/12 with 90% mid RCA stenosis, 100% distal RCA stenosis. The distal vessel was treated with overlapping drug eluting stents and the mid vessel was treated with a drug eluting stent. He was brought back 11/21/12 for staged PCI of the severe ostial LAD stenosis treated with a DES x 1. He had residual high grade disease in the small caliber diagonal branch which was felt to be too small for PCI. LVEF 55% by echo post cath. ABI normal March 2016. Exercise stress test without ischemia 09/15/14.   He is here today for cardiac follow up. He is feeling well overall. No chest pain or SOB. No LE edema. He is now exercising every day for last week. Has changed his diet. Has not started back smoking.    Primary Care Physician: [name],[name], MD   Past Medical History  Diagnosis Date  . Coronary artery disease     a. 10/2012 Inf STEMI/Cath/PCI: LM 20p, LAD 80-90p, 5427m/d, D1 sm 99d, LCX mild plaque, RCA 100d (2.75x16 Promus Premier DES into PDA, 3.25x20 Promus Prem DES dRCA, 3.5x12 Promus Prem DES mRCA), EF 55-60%;  b. 10/2012 Staged PCI of LAD: 3.5x12 Promus Prem DES.  . Osgood-Schlatter's disease [phone]~1983    "right knee" (11/21/2012)  . Nephrolithiasis     "passed on it's own" (11/21/2012)  . Hyperlipidemia   . NSVT (nonsustained ventricular tachycardia) (HCC)     a. in setting of MI 10/2012 - Echo: EF 55%, mild LVH.  . Leg pain     ABIs 3/16: Normal    Past Surgical History  Procedure Laterality Date  . Back surgery  1995  . Lumbar disc surgery  1995    "L5-S1 ruptured disc" (11/21/2012)  . Ulnar collateral ligament repair Left 2012  . Left heart catheterization with coronary angiogram N/A 11/19/2012   Procedure: LEFT HEART CATHETERIZATION WITH CORONARY ANGIOGRAM;  Surgeon: Kathleene Hazelhristopher D Lailani Tool, MD;  Location: [facility] C[facility];  Service: Cardiovascular;  Laterality: N/A;  . Percutaneous coronary stent intervention (pci-s) N/A 11/21/2012    Procedure: PERCUTANEOUS CORONARY STENT INTERVENTION (PCI-S);  Surgeon: Kathleene Hazelhristopher D Breckon Reeves, MD;  [facility];  Service: Cardiovascular;  Laterality: N/A;    Current Outpatient Prescriptions  Medication Sig Dispense Refill  . aspirin 81 MG chewable tablet Chew 1 tablet (81 mg total) by mouth daily.    . clopidogrel (PLAVIX) 75 MG tablet Take 1 tablet (75 mg total) by mouth daily. 30 tablet 0  . metoprolol tartrate (LOPRESSOR) 25 MG tablet Take 1 tablet (25 mg total) by mouth 2 (two) times daily. 60 tablet 3  . nitroGLYCERIN (NITROSTAT) 0.4 MG SL tablet Place 1 tablet (0.4 mg total) under the tongue every 5 (five) minutes x 3 doses as needed for chest pain. 25 tablet 6  . rosuvastatin (CRESTOR) 40 MG tablet Take 1 tablet (40 mg total) by mouth daily. 30 tablet 11   No current facility-administered medications for this visit.    No Known Allergies  Social History   Social History  . Marital Status: Married    Spouse Name: N/A  . Number of Children: N/A  . Years of Education: N/A   Occupational History  . Not  on file.   Social History Main Topics  . Smoking status: Former Smoker -- 0.50 packs/day for 18 years    Types: Cigarettes  . Smokeless tobacco: Never Used     Comment: 11/21/2012 offered smoking sessation materials; pt declines  . Alcohol Use: No  . Drug Use: No  . Sexual Activity: Yes   Other Topics Concern  . Not on file   Social History Narrative    Family History  Problem Relation Age of Onset  . CAD Mother     Review of Systems:  As stated in the HPI and otherwise negative.   BP 110/84 mmHg  Pulse 86  Ht  (1.88 m)  Wt 270 lb 12.8 oz (122.834 kg)  BMI 34.75 kg/m2  SpO2 96%  Physical  Examination: General: Well developed, well nourished, NAD HEENT: OP clear, mucus membranes moist SKIN: warm, dry. No rashes. Neuro: No focal deficits Musculoskeletal: Muscle strength 5/5 all ext Psychiatric: Mood and affect normal Neck: No JVD, no carotid bruits, no thyromegaly, no lymphadenopathy. Lungs:Clear bilaterally, no wheezes, rhonci, crackles Cardiovascular: Regular rate and rhythm. No murmurs, gallops or rubs. Abdomen:Soft. Bowel sounds present. Non-tender.  Extremities: No lower extremity edema. Pulses are 2 + in the bilateral DP/PT  Cardiac cath 11/19/12: Left main: 20% proximal stenosis.  Left Anterior Descending Artery: Large caliber vessel that courses to the apex. 80-90% proximal stenosis. The mid and distal vessel has serial 30% stenoses. The first diagonal branch is small to moderate in caliber with 99% stenosis in the distal portion of the vessel where it becomes very small caliber (1.0 mm).  Circumflex Artery: Large caliber vessel with early moderate caliber intermediate branch with mild plaque. The continuation of the AV groove Circumflex is small to moderate in caliber with mild diffuse plaque.  Right Coronary Artery: Large dominant vessel with mild proximal plaque, 90% mid stenosis, 100% occlusion distal vessel before the bifurcation.  Left Ventricular Angiogram: LVEF 55-60%.   EKG:  EKG is ordered today. The ekg ordered today demonstrates NSR, rate 86 bpm. T wave abn.   Recent Labs: No results found for requested labs within last 365 days.   Lipid Panel    Component Value Date/Time   CHOL 138 08/04/2014 0811   TRIG 88.0 08/04/2014 0811   HDL 36.40* 08/04/2014 0811   CHOLHDL 4 08/04/2014 0811   VLDL 17.6 08/04/2014 0811   LDLCALC 84 08/04/2014 0811     Wt Readings from Last 3 Encounters:  09/28/15 270 lb 12.8 oz (122.834 kg)  08/05/14 271 lb 9.6 oz (123.197 kg)  11/08/13 266 lb (120.657 kg)     Other studies Reviewed: Additional studies/ records that  were reviewed today include: . Review of the above records demonstrates:    Assessment and Plan:   1. CAD: Stable. No chest pains. He is on ASA, Plavix, beta blocker and statin.  Stress test 2016 with no ischemia.    2. Tobacco abuse, in remission: He has stopped smoking.   3. Hyperlipidemia: Continue statin. LDL is not at goal. Will repeat lipids and LFTs now.    Current medicines are reviewed at length with the patient today.  The patient does not have concerns regarding medicines.  The following changes have been made:  no change  Labs/ tests ordered today include:   Orders Placed This Encounter  Procedures  . Lipid Profile  . Hepatic function panel  . EKG 12-Lead    Disposition:   FU with me in 12  months  Signed, Verne Carrow, MD 09/28/2015 7:50 PM    [facility] [address] [place], [place], [place]  [address] Phone: [phone]; Fax: [phone]

## 2015-09-28 NOTE — Patient Instructions (Signed)
Medication Instructions:  Your physician recommends that you continue on your current medications as directed. Please refer to the Current Medication list given to you today.   Labwork:  Your physician recommends that you return for fasting lab work on March 22,2017.  The lab opens at 7:30 AM      Testing/Procedures: none  Follow-Up: Your physician wants you to follow-up in: 12 months.  You will receive a reminder letter in the mail two months in advance. If you don't receive a letter, please call our office to schedule the follow-up appointment.   Any Other Special Instructions Will Be Listed Below (If Applicable).     If you need a refill on your cardiac medications before your next appointment, please call your pharmacy.

## 2015-09-29 ENCOUNTER — Other Ambulatory Visit: Payer: Self-pay

## 2015-09-29 MED ORDER — CLOPIDOGREL BISULFATE 75 MG PO TABS: 75.0000 mg | ORAL_TABLET | Freq: Every day | ORAL | Status: DC

## 2015-10-14 ENCOUNTER — Other Ambulatory Visit (INDEPENDENT_AMBULATORY_CARE_PROVIDER_SITE_OTHER): Payer: BLUE CROSS/BLUE SHIELD | Admitting: *Deleted

## 2015-10-14 DIAGNOSIS — E785 Hyperlipidemia, unspecified: Secondary | ICD-10-CM | POA: Diagnosis not present

## 2015-10-14 LAB — LIPID PANEL
CHOL/HDL RATIO: 3.9 ratio (ref <=5.0)
CHOLESTEROL: 145 mg/dL (ref 125–200)
HDL: 37 mg/dL — AB (ref >=40)
LDL Cholesterol: 85 mg/dL (ref <130)
TRIGLYCERIDES: 114 mg/dL (ref <150)
VLDL: 23 mg/dL (ref <30)

## 2015-10-14 LAB — HEPATIC FUNCTION PANEL
ALT: 19 U/L (ref 9–46)
AST: 17 U/L (ref 10–40)
Albumin: 3.8 g/dL (ref 3.6–5.1)
Alkaline Phosphatase: 77 U/L (ref 40–115)
Bilirubin, Direct: 0.2 mg/dL (ref <=0.2)
Indirect Bilirubin: 0.6 mg/dL (ref 0.2–1.2)
TOTAL PROTEIN: 6.6 g/dL (ref 6.1–8.1)
Total Bilirubin: 0.8 mg/dL (ref 0.2–1.2)

## 2015-10-19 ENCOUNTER — Encounter: Payer: Self-pay | Admitting: *Deleted

## 2015-10-29 ENCOUNTER — Ambulatory Visit: Payer: BLUE CROSS/BLUE SHIELD | Admitting: Cardiovascular Disease

## 2015-12-01 ENCOUNTER — Other Ambulatory Visit: Payer: Self-pay | Admitting: *Deleted

## 2015-12-01 MED ORDER — ROSUVASTATIN CALCIUM 40 MG PO TABS: 40.0000 mg | ORAL_TABLET | Freq: Every day | ORAL | Status: DC

## 2016-01-03 ENCOUNTER — Emergency Department (HOSPITAL_COMMUNITY)
Admission: EM | Admit: 2016-01-03 | Discharge: 2016-01-03 | Disposition: A | Discharge status: Home / Self Care | Payer: BLUE CROSS/BLUE SHIELD | Attending: Emergency Medicine | Admitting: Emergency Medicine

## 2016-01-03 ENCOUNTER — Encounter (HOSPITAL_COMMUNITY): Payer: Self-pay

## 2016-01-03 ENCOUNTER — Emergency Department (HOSPITAL_COMMUNITY): Payer: BLUE CROSS/BLUE SHIELD

## 2016-01-03 DIAGNOSIS — Z7982 Long term (current) use of aspirin: Secondary | ICD-10-CM | POA: Diagnosis not present

## 2016-01-03 DIAGNOSIS — Z7901 Long term (current) use of anticoagulants: Secondary | ICD-10-CM | POA: Insufficient documentation

## 2016-01-03 DIAGNOSIS — N200 Calculus of kidney: Secondary | ICD-10-CM | POA: Insufficient documentation

## 2016-01-03 DIAGNOSIS — Z87442 Personal history of urinary calculi: Secondary | ICD-10-CM

## 2016-01-03 DIAGNOSIS — R1031 Right lower quadrant pain: Secondary | ICD-10-CM

## 2016-01-03 DIAGNOSIS — Z79899 Other long term (current) drug therapy: Secondary | ICD-10-CM | POA: Diagnosis not present

## 2016-01-03 DIAGNOSIS — I2510 Atherosclerotic heart disease of native coronary artery without angina pectoris: Secondary | ICD-10-CM | POA: Insufficient documentation

## 2016-01-03 DIAGNOSIS — Z87891 Personal history of nicotine dependence: Secondary | ICD-10-CM | POA: Diagnosis not present

## 2016-01-03 DIAGNOSIS — R300 Dysuria: Secondary | ICD-10-CM | POA: Diagnosis present

## 2016-01-03 LAB — URINALYSIS, ROUTINE W REFLEX MICROSCOPIC
Bilirubin Urine: NEGATIVE
Glucose, UA: NEGATIVE mg/dL
Ketones, ur: NEGATIVE mg/dL
Leukocytes, UA: NEGATIVE
NITRITE: NEGATIVE
PH: 6 (ref 5.0–8.0)
Protein, ur: NEGATIVE mg/dL
SPECIFIC GRAVITY, URINE: 1.015 (ref 1.005–1.030)

## 2016-01-03 LAB — URINE MICROSCOPIC-ADD ON: Bacteria, UA: NONE SEEN

## 2016-01-03 LAB — BASIC METABOLIC PANEL
Anion gap: 7 (ref 5–15)
BUN: 11 mg/dL (ref 6–20)
CALCIUM: 9.1 mg/dL (ref 8.9–10.3)
CHLORIDE: 106 mmol/L (ref 101–111)
CO2: 26 mmol/L (ref 22–32)
CREATININE: 0.95 mg/dL (ref 0.61–1.24)
Glucose, Bld: 93 mg/dL (ref 65–99)
Potassium: 4 mmol/L (ref 3.5–5.1)
SODIUM: 139 mmol/L (ref 135–145)

## 2016-01-03 MED ORDER — MORPHINE SULFATE (PF) 4 MG/ML IV SOLN
4.0000 mg | Freq: Once | INTRAVENOUS | Status: AC
  Administered 2016-01-03: 4 mg via INTRAVENOUS
  Filled 2016-01-03: qty 1

## 2016-01-03 MED ORDER — KETOROLAC TROMETHAMINE 30 MG/ML IJ SOLN
30.0000 mg | Freq: Once | INTRAMUSCULAR | Status: AC
  Administered 2016-01-03: 30 mg via INTRAVENOUS
  Filled 2016-01-03: qty 1

## 2016-01-03 MED ORDER — IBUPROFEN 800 MG PO TABS: 800.0000 mg | ORAL_TABLET | Freq: Three times a day (TID) | ORAL | Status: DC | PRN

## 2016-01-03 MED ORDER — SODIUM CHLORIDE 0.9 % IV BOLUS (SEPSIS): 1000.0000 mL | Freq: Once | INTRAVENOUS | Status: DC

## 2016-01-03 MED ORDER — HYDROCODONE-ACETAMINOPHEN 5-325 MG PO TABS: 1.0000 | ORAL_TABLET | Freq: Four times a day (QID) | ORAL | Status: DC | PRN

## 2016-01-03 NOTE — ED Provider Notes (Signed)
CSN: [id number]     Arrival date & time 01/03/16  0957 History   None    Chief Complaint  Patient presents with  . Dysuria  . Flank Pain     (Consider location/radiation/quality/duration/timing/severity/associated sxs/prior Treatment) HPI  Patient is a 50 yo M with a PMHx of CAD (MI in 2014), nephrolithiasis, HLD presenting to the ED complaining of intermittent pain and pressure in his right groin region. States his pain started yesterday evening and is 7-8/ 10 in intensity. He has also been having burning with urination and urinary frequency. Denies having any hematuria. States his pain is better when he walks and worse at rest.  Denies having any fevers, chills, nausea, vomiting, abdominal pain, or flank pain. Patient states this pain is similar to the pain when he had with kidney stones 3-4 years ago. States he passed the stone on his own at that time and was told it was a calcium stone.      Past Medical History  Diagnosis Date  . Coronary artery disease     a. 10/2012 Inf STEMI/Cath/PCI: LM 20p, LAD 80-90p, 6743m/d, D1 sm 99d, LCX mild plaque, RCA 100d (2.75x16 Promus Premier DES into PDA, 3.25x20 Promus Prem DES dRCA, 3.5x12 Promus Prem DES mRCA), EF 55-60%;  b. 10/2012 Staged PCI of LAD: 3.5x12 Promus Prem DES.  . Osgood-Schlatter's disease [phone]~1983    "right knee" (11/21/2012)  . Nephrolithiasis     "passed on it's own" (11/21/2012)  . Hyperlipidemia   . NSVT (nonsustained ventricular tachycardia) (HCC)     a. in setting of MI 10/2012 - Echo: EF 55%, mild LVH.  . Leg pain     ABIs 3/16: Normal   Past Surgical History  Procedure Laterality Date  . Back surgery  1995  . Lumbar disc surgery  1995    "L5-S1 ruptured disc" (11/21/2012)  . Ulnar collateral ligament repair Left 2012  . Left heart catheterization with coronary angiogram N/A 11/19/2012    Procedure: LEFT HEART CATHETERIZATION WITH CORONARY ANGIOGRAM;  Surgeon: Kathleene Hazelhristopher D McAlhany, MD;  [facility];  Service:  Cardiovascular;  Laterality: N/A;  . Percutaneous coronary stent intervention (pci-s) N/A 11/21/2012    Procedure: PERCUTANEOUS CORONARY STENT INTERVENTION (PCI-S);  Surgeon: Kathleene Hazelhristopher D McAlhany, MD;  [facility];  Service: Cardiovascular;  Laterality: N/A;   Family History  Problem Relation Age of Onset  . CAD Mother    Social History  Substance Use Topics  . Smoking status: Former Smoker -- 0.50 packs/day for 18 years    Types: Cigarettes  . Smokeless tobacco: Never Used     Comment: 11/21/2012 offered smoking sessation materials; pt declines  . Alcohol Use: No    Review of Systems  Constitutional: Negative for fever, chills and fatigue.  HENT: Negative for congestion and sore throat.   Eyes: Negative for pain and visual disturbance.  Respiratory: Negative for cough, shortness of breath and wheezing.   Cardiovascular: Negative for chest pain and leg swelling.  Gastrointestinal: Negative for nausea, vomiting, abdominal pain, diarrhea and constipation.  Genitourinary: Positive for dysuria and frequency. Negative for hematuria and flank pain.       Pain in right groin   Musculoskeletal: Negative for myalgias and back pain.  Skin: Negative for pallor and rash.  Neurological: Negative for weakness, light-headedness and numbness.      Allergies  Review of patient's allergies indicates no known allergies.  Home Medications   Prior to Admission medications   Medication Sig  Start Date End Date Taking? Authorizing Provider  aspirin 81 MG chewable tablet Chew 1 tablet (81 mg total) by mouth daily. 11/22/12   Ok Anis, NP  clopidogrel (PLAVIX) 75 MG tablet Take 1 tablet (75 mg total) by mouth daily. 09/29/15   Kathleene Hazel, MD  metoprolol tartrate (LOPRESSOR) 25 MG tablet Take 1 tablet (25 mg total) by mouth 2 (two) times daily. 08/07/15   Kathleene Hazel, MD  nitroGLYCERIN (NITROSTAT) 0.4 MG SL tablet Place 1 tablet (0.4 mg total) under the tongue  every 5 (five) minutes x 3 doses as needed for chest pain. 09/28/15   Kathleene Hazel, MD  rosuvastatin (CRESTOR) 40 MG tablet Take 1 tablet (40 mg total) by mouth daily. 12/01/15   Kathleene Hazel, MD   BP 145/98 mmHg  Pulse 77  Temp(Src) 97.7 F (36.5 C) (Oral)  Resp 18  Ht 6' (1.829 m)  Wt 117.028 kg  BMI 34.98 kg/m2  SpO2 97% Physical Exam  Constitutional: He is oriented to person, place, and time. He appears well-developed and well-nourished. No distress.  HENT:  Head: Normocephalic and atraumatic.  Mouth/Throat: Oropharynx is clear and moist.  Eyes: EOM are normal. Pupils are equal, round, and reactive to light.  Neck: Neck supple. No tracheal deviation present.  Cardiovascular: Normal rate, regular rhythm and intact distal pulses.  Exam reveals no gallop and no friction rub.   No murmur heard. Pulmonary/Chest: Effort normal and breath sounds normal. No respiratory distress. He has no wheezes. He has no rales.  Abdominal: Soft. Bowel sounds are normal. He exhibits no distension. There is no tenderness. There is no guarding.  Musculoskeletal: He exhibits no edema or tenderness.  No CVA tenderness bilaterally.   Neurological: He is alert and oriented to person, place, and time.  Skin: Skin is warm and dry. No rash noted. No erythema.    ED Course  Procedures (including critical care time) Labs Review Labs Reviewed  URINALYSIS, ROUTINE W REFLEX MICROSCOPIC (NOT AT [facility])    Imaging Review No results found. I have personally reviewed and evaluated these images and lab results as part of my medical decision-making.   EKG Interpretation None      MDM   Final diagnoses:  None   Patient is presenting with a 1 day history of dysuria, urinary frequency, and pain/ pressure in his right groin region. Urine dipstick showing moderate amount of Hgb. Renal US showing no hydronephrosis or renal calculi. Mild prominent right extrarenal pelvis without frank  hydronephrosis. Under distended urinary bladder. Patient has been given Toradol 30 mg once and Morphine 4 mg once for pain. CT of abdomen and pelvis showing a right sided punctate stone either at or just exiting the ureterovesicular junction and mild secondary hydroureteronephrosis. BMP showing normal renal function (SCr 0.95). -Patient has been advised to drink plenty of fluids to help facilitate spontaneous passage of this stone.  -Motrin 800 mg TID prn pain  -Advised to follow-up with his PCP within a week or return to the ED if his pain worsens or he develops fevers/ chills.     John Giovanni, MD 01/03/16 1342  Geoffery Lyons, MD 01/03/16 1550

## 2016-01-03 NOTE — Discharge Instructions (Signed)
Please follow up with your primary care physician within 1 week. If you start having fevers or chills, or notice blood in your urine please seek medical attention immediately.   Kidney Stones Kidney stones (urolithiasis) are deposits that form inside your kidneys. The intense pain is caused by the stone moving through the urinary tract. When the stone moves, the ureter goes into spasm around the stone. The stone is usually passed in the urine.  CAUSES   A disorder that makes certain neck glands produce too much parathyroid hormone (primary hyperparathyroidism).  A buildup of uric acid crystals, similar to gout in your joints.  Narrowing (stricture) of the ureter.  A kidney obstruction present at birth (congenital obstruction).  Previous surgery on the kidney or ureters.  Numerous kidney infections. SYMPTOMS   Feeling sick to your stomach (nauseous).  Throwing up (vomiting).  Blood in the urine (hematuria).  Pain that usually spreads (radiates) to the groin.  Frequency or urgency of urination. DIAGNOSIS   Taking a history and physical exam.  Blood or urine tests.  CT scan.  Occasionally, an examination of the inside of the urinary bladder (cystoscopy) is performed. TREATMENT   Observation.  Increasing your fluid intake.  Extracorporeal shock wave lithotripsy--This is a noninvasive procedure that uses shock waves to break up kidney stones.  Surgery may be needed if you have severe pain or persistent obstruction. There are various surgical procedures. Most of the procedures are performed with the use of small instruments. Only small incisions are needed to accommodate these instruments, so recovery time is minimized. The size, location, and chemical composition are all important variables that will determine the proper choice of action for you. Talk to your health care provider to better understand your situation so that you will minimize the risk of injury to yourself and  your kidney.  HOME CARE INSTRUCTIONS   Drink enough water and fluids to keep your urine clear or pale yellow. This will help you to pass the stone or stone fragments.  Strain all urine through the provided strainer. Keep all particulate matter and stones for your health care provider to see. The stone causing the pain may be as small as a grain of salt. It is very important to use the strainer each and every time you pass your urine. The collection of your stone will allow your health care provider to analyze it and verify that a stone has actually passed. The stone analysis will often identify what you can do to reduce the incidence of recurrences.  Only take over-the-counter or prescription medicines for pain, discomfort, or fever as directed by your health care provider.  Keep all follow-up visits as told by your health care provider. This is important.  Get follow-up X-rays if required. The absence of pain does not always mean that the stone has passed. It may have only stopped moving. If the urine remains completely obstructed, it can cause loss of kidney function or even complete destruction of the kidney. It is your responsibility to make sure X-rays and follow-ups are completed. Ultrasounds of the kidney can show blockages and the status of the kidney. Ultrasounds are not associated with any radiation and can be performed easily in a matter of minutes.  Make changes to your daily diet as told by your health care provider. You may be told to:  Limit the amount of salt that you eat.  Eat 5 or more servings of fruits and vegetables each day.  Limit the amount  of meat, poultry, fish, and eggs that you eat.  Collect a 24-hour urine sample as told by your health care provider.You may need to collect another urine sample every 6-12 months. SEEK MEDICAL CARE IF:  You experience pain that is progressive and unresponsive to any pain medicine you have been prescribed. SEEK IMMEDIATE MEDICAL  CARE IF:   Pain cannot be controlled with the prescribed medicine.  You have a fever or shaking chills.  The severity or intensity of pain increases over 18 hours and is not relieved by pain medicine.  You develop a new onset of abdominal pain.  You feel faint or pass out.  You are unable to urinate.   This information is not intended to replace advice given to you by your health care provider. Make sure you discuss any questions you have with your health care provider.   Document Released: 07/11/2005 Document Revised: 04/01/2015 Document Reviewed: 12/12/2012 Elsevier Interactive Patient Education [employer]

## 2016-03-07 ENCOUNTER — Other Ambulatory Visit: Payer: Self-pay

## 2016-03-07 MED ORDER — METOPROLOL TARTRATE 25 MG PO TABS: 25.0000 mg | ORAL_TABLET | Freq: Two times a day (BID) | ORAL | 3 refills | Status: DC

## 2016-10-10 ENCOUNTER — Other Ambulatory Visit: Payer: Self-pay | Admitting: Cardiovascular Disease

## 2016-10-10 MED ORDER — ROSUVASTATIN CALCIUM 40 MG PO TABS: 40.0000 mg | ORAL_TABLET | Freq: Every day | ORAL | 0 refills | Status: DC

## 2016-10-10 MED ORDER — CLOPIDOGREL BISULFATE 75 MG PO TABS: 75.0000 mg | ORAL_TABLET | Freq: Every day | ORAL | 0 refills | Status: DC

## 2016-10-11 ENCOUNTER — Other Ambulatory Visit: Payer: Self-pay | Admitting: *Deleted

## 2016-10-11 ENCOUNTER — Telehealth: Payer: Self-pay | Admitting: Cardiovascular Disease

## 2016-10-11 MED ORDER — METOPROLOL TARTRATE 25 MG PO TABS: 25.0000 mg | ORAL_TABLET | Freq: Two times a day (BID) | ORAL | 1 refills | Status: DC

## 2016-10-11 MED ORDER — NITROGLYCERIN 0.4 MG SL SUBL: 0.4000 mg | SUBLINGUAL_TABLET | SUBLINGUAL | 0 refills | Status: DC | PRN

## 2016-10-11 MED ORDER — ROSUVASTATIN CALCIUM 40 MG PO TABS: 40.0000 mg | ORAL_TABLET | Freq: Every day | ORAL | 0 refills | Status: DC

## 2016-10-11 NOTE — Telephone Encounter (Signed)
New Message   *STAT* If patient is at the pharmacy, call can be transferred to refill team.   1. Which medications need to be refilled? (please list name of each medication and dose if known)  roskuvastatin (Crestor) 40 mg tablet once daily metoprolol tartrate (Lopressor) 25 mg twice daily nitroGlyercerin (Nitrostat) .4 mg sl tablet under tongue 3x's as needed  2. Which pharmacy/location (including street and city if local pharmacy) is medication to be sent to? [facility], [address], [place], [place]  3. Do they need a 30 day or 90 day supply?  Pt is needing his refill's until his upcoming appt so pt will not run out of his medication.

## 2016-12-02 ENCOUNTER — Encounter: Payer: Self-pay | Admitting: Cardiovascular Disease

## 2016-12-02 ENCOUNTER — Ambulatory Visit (INDEPENDENT_AMBULATORY_CARE_PROVIDER_SITE_OTHER): Payer: Self-pay | Admitting: Cardiovascular Disease

## 2016-12-02 VITALS — BP 124/82 | HR 85 | Ht 72.0 in | Wt 267.4 lb

## 2016-12-02 DIAGNOSIS — E7800 Pure hypercholesterolemia, unspecified: Secondary | ICD-10-CM

## 2016-12-02 DIAGNOSIS — I2510 Atherosclerotic heart disease of native coronary artery without angina pectoris: Secondary | ICD-10-CM

## 2016-12-02 MED ORDER — CLOPIDOGREL BISULFATE 75 MG PO TABS: 75.0000 mg | ORAL_TABLET | Freq: Every day | ORAL | 3 refills | Status: DC

## 2016-12-02 MED ORDER — ROSUVASTATIN CALCIUM 40 MG PO TABS: 40.0000 mg | ORAL_TABLET | Freq: Every day | ORAL | 3 refills | Status: DC

## 2016-12-02 MED ORDER — METOPROLOL TARTRATE 25 MG PO TABS: 25.0000 mg | ORAL_TABLET | Freq: Two times a day (BID) | ORAL | 3 refills | Status: DC

## 2016-12-02 NOTE — Patient Instructions (Addendum)
Medication Instructions:  Your physician recommends that you continue on your current medications as directed. Please refer to the Current Medication list given to you today.   Labwork: Your physician recommends that you return for lab work on 12/07/16.  This will be fasting. The lab opens at 7:30 AM    Testing/Procedures: none  Follow-Up: Your physician recommends that you schedule a follow-up appointment in: 12 months. Please call our office in about 9 months to schedule this appointment.     Any Other Special Instructions Will Be Listed Below (If Applicable).     If you need a refill on your cardiac medications before your next appointment, please call your pharmacy.

## 2016-12-02 NOTE — Progress Notes (Signed)
Chief Complaint  Patient presents with  . Follow-up    CAD      History of Present Illness: 51 yo male with history of CAD, tobacco abuse, HLD here today for cardiac follow up. He was admitted to [facility] 11/19/12 with an inferior STEMI. Cardiac cath 11/19/12 with 90% mid RCA stenosis, 100% distal RCA stenosis. The distal vessel was treated with overlapping drug eluting stents and the mid vessel was treated with a drug eluting stent. He was brought back 12-17-12 for staged PCI of the severe ostial LAD stenosis treated with a DES x 1. He had residual high grade disease in the small caliber diagonal branch which was felt to be too small for PCI. LVEF 55% by echo post cath. ABI normal March 2016. Exercise stress test without ischemia 09/15/14.   He is here today for follow up. The patient denies any chest pain, dyspnea, palpitations, lower extremity edema, orthopnea, PND, dizziness, near syncope or syncope. He is feeling great. He has been working 8-10 hour days with no limitation. He has been hunting and fishing.   Primary Care Physician: Laurey Morale, MD   Past Medical History:  Diagnosis Date  . Coronary artery disease    a. 10/2012 Inf STEMI/Cath/PCI: LM 20p, LAD 80-90p, 44md, D1 sm 99d, LCX mild plaque, RCA 100d (2.75x16 Promus Premier DES into PDA, 3.25x20 Promus Prem DES dRCA, 3.5x12 Promus Prem DES mRCA), EF 55-60%;  b. 10/2012 Staged PCI of LAD: 3.5x12 Promus Prem DES.  .Marland KitchenHyperlipidemia   . Leg pain    ABIs 3/16: Normal  . Nephrolithiasis    "passed on it's own" (42014-05-26  . NSVT (nonsustained ventricular tachycardia) (HCC)    a. in setting of MI 10/2012 - Echo: EF 55%, mild LVH.  . Osgood-Schlatter's disease ~[phone]  "right knee" (4May 26, 2014    Past Surgical History:  Procedure Laterality Date  . BACK SURGERY  1995  . LEFT HEART CATHETERIZATION WITH CORONARY ANGIOGRAM N/A 11/19/2012   Procedure: LEFT HEART CATHETERIZATION WITH CORONARY ANGIOGRAM;  Surgeon: CBurnell Blanks  MD;  [facility];  Service: Cardiovascular;  Laterality: N/A;  . LUMBAR [place]  "L5-S1 ruptured disc" (4May 26, 2014  . PERCUTANEOUS CORONARY STENT INTERVENTION (PCI-S) N/A 405-26-2014  Procedure: PERCUTANEOUS CORONARY STENT INTERVENTION (PCI-S);  Surgeon: CBurnell Blanks MD;  [facility];  Service: Cardiovascular;  Laterality: N/A;  . ULNAR COLLATERAL LIGAMENT REPAIR Left 2012    Current Outpatient Prescriptions  Medication Sig Dispense Refill  . aspirin 81 MG chewable tablet Chew 1 tablet (81 mg total) by mouth daily.    . clopidogrel (PLAVIX) 75 MG tablet Take 1 tablet (75 mg total) by mouth daily. 90 tablet 3  . HYDROcodone-acetaminophen (NORCO) 5-325 MG tablet Take 1 tablet by mouth every 6 (six) hours as needed for moderate pain. 15 tablet 0  . metoprolol tartrate (LOPRESSOR) 25 MG tablet Take 1 tablet (25 mg total) by mouth 2 (two) times daily. 180 tablet 3  . nitroGLYCERIN (NITROSTAT) 0.4 MG SL tablet Place 0.4 mg under the tongue every 5 (five) minutes as needed for chest pain.    . rosuvastatin (CRESTOR) 40 MG tablet Take 1 tablet (40 mg total) by mouth daily. 90 tablet 3   No current facility-administered medications for this visit.     No Known Allergies  Social History   Social History  . Marital status: Married    Spouse name: N/A  . Number of children:  N/A  . Years of education: N/A   Occupational History  . Not on file.   Social History Main Topics  . Smoking status: Former Smoker    Packs/day: 0.50    Years: 18.00    Types: Cigarettes  . Smokeless tobacco: Never Used     Comment: 11/21/2012 offered smoking sessation materials; pt declines  . Alcohol use No  . Drug use: No  . Sexual activity: Yes   Other Topics Concern  . Not on file   Social History Narrative  . No narrative on file    Family History  Problem Relation Age of Onset  . CAD Mother     Review of Systems:  As stated in the HPI and otherwise  negative.   BP 124/82   Pulse 85   Ht 6' (1.829 m)   Wt 267 lb 6.4 oz (121.3 kg)   BMI 36.27 kg/m   Physical Examination: General: Well developed, well nourished, NAD  HEENT: OP clear, mucus membranes moist  SKIN: warm, dry. No rashes. Neuro: No focal deficits  Musculoskeletal: Muscle strength 5/5 all ext  Psychiatric: Mood and affect normal  Neck: No JVD, no carotid bruits, no thyromegaly, no lymphadenopathy.  Lungs:Clear bilaterally, no wheezes, rhonci, crackles Cardiovascular: Regular rate and rhythm. No murmurs, gallops or rubs. Abdomen:Soft. Bowel sounds present. Non-tender.  Extremities: No lower extremity edema. Pulses are 2 + in the bilateral DP/PT.  Cardiac cath 11/19/12: Left main: 20% proximal stenosis.  Left Anterior Descending Artery: Large caliber vessel that courses to the apex. 80-90% proximal stenosis. The mid and distal vessel has serial 30% stenoses. The first diagonal branch is small to moderate in caliber with 99% stenosis in the distal portion of the vessel where it becomes very small caliber (1.0 mm).  Circumflex Artery: Large caliber vessel with early moderate caliber intermediate branch with mild plaque. The continuation of the AV groove Circumflex is small to moderate in caliber with mild diffuse plaque.  Right Coronary Artery: Large dominant vessel with mild proximal plaque, 90% mid stenosis, 100% occlusion distal vessel before the bifurcation.  Left Ventricular Angiogram: LVEF 55-60%.   EKG:  EKG is  ordered today. The ekg ordered today demonstrates NSR, rate 85 bpm. Non-specific T wave abnormality, unchanged.   Recent Labs: 01/03/2016: BUN 11; Creatinine, Ser 0.95; Potassium 4.0; Sodium 139   Lipid Panel    Component Value Date/Time   CHOL 145 10/14/2015 0822   TRIG 114 10/14/2015 0822   HDL 37 (L) 10/14/2015 0822   CHOLHDL 3.9 10/14/2015 0822   VLDL 23 10/14/2015 0822   LDLCALC 85 10/14/2015 0822     Wt Readings from Last 3 Encounters:    12/02/16 267 lb 6.4 oz (121.3 kg)  01/03/16 258 lb (117 kg)  09/28/15 270 lb 12.8 oz (122.8 kg)     Other studies Reviewed: Additional studies/ records that were reviewed today include: . Review of the above records demonstrates:    Assessment and Plan:   1. CAD without angina: No chest pains suggestive of angina. Will continue ASA, Plavix, statin and beta blocker. Stress test 2016 with no ischemia.    2. Tobacco abuse, in remission: He stopped smoking.   3. Hyperlipidemia: LDL near goal in 2017. Continue statin. Repeat lipids and LFTs now.   Current medicines are reviewed at length with the patient today.  The patient does not have concerns regarding medicines.  The following changes have been made:  no change  Labs/ tests ordered today  include:   Orders Placed This Encounter  Procedures  . Comp Met (CMET)  . Lipid Profile  . EKG 12-Lead    Disposition:   FU with me in 12  months  Signed, Lauree Chandler, MD 12/02/2016 4:15 PM    [facility] [place], [place],   [address] Phone: [phone]; Fax: [phone]

## 2016-12-07 ENCOUNTER — Other Ambulatory Visit: Payer: Self-pay

## 2017-09-14 ENCOUNTER — Other Ambulatory Visit: Payer: Self-pay | Admitting: Cardiovascular Disease

## 2017-11-16 ENCOUNTER — Telehealth: Payer: Self-pay | Admitting: Cardiovascular Disease

## 2017-11-16 ENCOUNTER — Other Ambulatory Visit: Payer: Self-pay | Admitting: *Deleted

## 2017-11-16 MED ORDER — METOPROLOL TARTRATE 25 MG PO TABS: 25.0000 mg | ORAL_TABLET | Freq: Two times a day (BID) | ORAL | 1 refills | Status: DC

## 2017-11-16 NOTE — Telephone Encounter (Signed)
°*  STAT* If patient is at the pharmacy, call can be transferred to refill team.   1. Which medications need to be refilled? (please list name of each medication and dose if known) Metoprolol-Pt need his medicine,can not see Dr Clifton JamesMcAlhany until 04-16-18  2. Which pharmacy/location (including street and city if local pharmacy) is medication to be sent to?[facility] [place]  3. Do they need a 30 day or 90 day supply? Enough his office visit 04-16-18

## 2017-12-13 ENCOUNTER — Telehealth: Payer: Self-pay | Admitting: Cardiovascular Disease

## 2017-12-13 MED ORDER — CLOPIDOGREL BISULFATE 75 MG PO TABS: 75.0000 mg | ORAL_TABLET | Freq: Every day | ORAL | 2 refills | Status: DC

## 2017-12-13 MED ORDER — ROSUVASTATIN CALCIUM 40 MG PO TABS: 40.0000 mg | ORAL_TABLET | Freq: Every day | ORAL | 2 refills | Status: DC

## 2017-12-13 NOTE — Telephone Encounter (Signed)
New Message    *STAT* If patient is at the pharmacy, call can be transferred to refill team.   1. Which medications need to be refilled? (please list name of each medication and dose if known) clopidogrel (PLAVIX) 75 MG tablet and rosuvastatin (CRESTOR) 40 MG tablet    2. Which pharmacy/location (including street and city if local pharmacy) is medication to be sent to? [id number] - [place], [place] - [address]  3. Do they need a 30 day or 90 day supply? 90

## 2018-04-02 ENCOUNTER — Encounter: Payer: Self-pay | Admitting: Cardiovascular Disease

## 2018-04-16 ENCOUNTER — Encounter: Payer: Self-pay | Admitting: Cardiovascular Disease

## 2018-04-16 ENCOUNTER — Ambulatory Visit (INDEPENDENT_AMBULATORY_CARE_PROVIDER_SITE_OTHER): Payer: PRIVATE HEALTH INSURANCE | Admitting: Cardiovascular Disease

## 2018-04-16 VITALS — BP 124/82 | HR 89 | Ht 72.0 in | Wt 275.1 lb

## 2018-04-16 DIAGNOSIS — I2510 Atherosclerotic heart disease of native coronary artery without angina pectoris: Secondary | ICD-10-CM | POA: Diagnosis not present

## 2018-04-16 DIAGNOSIS — E7800 Pure hypercholesterolemia, unspecified: Secondary | ICD-10-CM

## 2018-04-16 NOTE — Progress Notes (Signed)
Chief Complaint  Patient presents with  . Follow-up    CAD   History of Present Illness: 52 yo male with history of CAD, tobacco abuse, HLD here today for cardiac follow up. [facility] with an inferior STEMI. Cardiac cath 11/19/12 with 90% mid RCA stenosis, 100% distal RCA stenosis. The distal vessel was treated with overlapping drug eluting stents and the mid vessel was treated with a drug eluting stent. He was brought back 11/21/12 for staged PCI of the severe ostial LAD stenosis treated with a DES x 1. He had residual high grade disease in the small caliber diagonal branch which was felt to be too small for PCI. LVEF 55% by echo post cath. ABI normal March 2016. Exercise stress test without ischemia 09/15/14.   He is here today for follow up. The patient denies any chest pain, dyspnea, palpitations, lower extremity edema, orthopnea, PND, dizziness, near syncope or syncope.    Primary Care Physician: [name], [name], MD   Past Medical History:  Diagnosis Date  . Coronary artery disease    a. 10/2012 Inf STEMI/Cath/PCI: LM 20p, LAD 80-90p, 5412m/d, D1 sm 99d, LCX mild plaque, RCA 100d (2.75x16 Promus Premier DES into PDA, 3.25x20 Promus Prem DES dRCA, 3.5x12 Promus Prem DES mRCA), EF 55-60%;  b. 10/2012 Staged PCI of LAD: 3.5x12 Promus Prem DES.  [name]. Hyperlipidemia   . Leg pain    ABIs 3/16: Normal  . Nephrolithiasis    "passed on it's own" (11/21/2012)  . NSVT (nonsustained ventricular tachycardia) (HCC)    a. in setting of MI 10/2012 - Echo: EF 55%, mild LVH.  . Osgood-Schlatter's disease [phone]   "right knee" (11/21/2012)    Past Surgical History:  Procedure Laterality Date  . BACK SURGERY  1995  . LEFT HEART CATHETERIZATION WITH CORONARY ANGIOGRAM N/A 11/19/2012   Procedure: LEFT HEART CATHETERIZATION WITH CORONARY ANGIOGRAM;  Surgeon: Kathleene Hazelhristopher D McAlhany, MD;  [facility];  Service: Cardiovascular;  Laterality: N/A;  . LUMBAR DISC SURGERY  1995   "L5-S1  ruptured disc" (11/21/2012)  . PERCUTANEOUS CORONARY STENT INTERVENTION (PCI-S) N/A 11/21/2012   Procedure: PERCUTANEOUS CORONARY STENT INTERVENTION (PCI-S);  Surgeon: Kathleene Hazelhristopher D McAlhany, MD;  [facility];  Service: Cardiovascular;  Laterality: N/A;  . ULNAR COLLATERAL LIGAMENT REPAIR Left 2012    Current Outpatient Medications  Medication Sig Dispense Refill  . clopidogrel (PLAVIX) 75 MG tablet Take 1 tablet (75 mg total) by mouth daily. Please keep appointment in September for further refills 90 tablet 2  . metoprolol tartrate (LOPRESSOR) 25 MG tablet Take 1 tablet (25 mg total) by mouth 2 (two) times daily. Please keep 04/16/18 appointment for further refills 180 tablet 1  . nitroGLYCERIN (NITROSTAT) 0.4 MG SL tablet Place 0.4 mg under the tongue every 5 (five) minutes as needed for chest pain.    . rosuvastatin (CRESTOR) 40 MG tablet Take 1 tablet (40 mg total) by mouth daily. Please keep appointment in September for futher refills 90 tablet 2   No current facility-administered medications for this visit.     No Known Allergies  Social History   Socioeconomic History  . Marital status: Married    Spouse name: Not on file  . Number of children: Not on file  . Years of education: Not on file  . Highest education level: Not on file  Occupational History  . Not on file  Social Needs  . Financial resource strain: Not on file  .  Food insecurity:    Worry: Not on file    Inability: Not on file  . Transportation needs:    Medical: Not on file    Non-medical: Not on file  Tobacco Use  . Smoking status: Former Smoker    Packs/day: 0.50    Years: 18.00    Pack years: 9.00    Types: Cigarettes  . Smokeless tobacco: Never Used  . Tobacco comment: 11/21/2012 offered smoking sessation materials; pt declines  Substance and Sexual Activity  . Alcohol use: No  . Drug use: No  . Sexual activity: Yes  Lifestyle  . Physical activity:    Days per week: Not on file     Minutes per session: Not on file  . Stress: Not on file  Relationships  . Social connections:    Talks on phone: Not on file    Gets together: Not on file    Attends religious service: Not on file    Active member of club or organization: Not on file    Attends meetings of clubs or organizations: Not on file    Relationship status: Not on file  . Intimate partner violence:    Fear of current or ex partner: Not on file    Emotionally abused: Not on file    Physically abused: Not on file    Forced sexual activity: Not on file  Other Topics Concern  . Not on file  Social History Narrative  . Not on file    Family History  Problem Relation Age of Onset  . CAD Mother     Review of Systems:  As stated in the HPI and otherwise negative.   BP 124/82   Pulse 89   Ht 6' (1.829 m)   Wt 275 lb 1.9 oz (124.8 kg)   SpO2 93%   BMI 37.31 kg/m   Physical Examination:  General: Well developed, well nourished, NAD  HEENT: OP clear, mucus membranes moist  SKIN: warm, dry. No rashes. Neuro: No focal deficits  Musculoskeletal: Muscle strength 5/5 all ext  Psychiatric: Mood and affect normal  Neck: No JVD, no carotid bruits, no thyromegaly, no lymphadenopathy.  Lungs:Clear bilaterally, no wheezes, rhonci, crackles Cardiovascular: Regular rate and rhythm. No murmurs, gallops or rubs. Abdomen:Soft. Bowel sounds present. Non-tender.  Extremities: No lower extremity edema. Pulses are 2 + in the bilateral DP/PT.  Cardiac cath 11/19/12: Left main: 20% proximal stenosis.  Left Anterior Descending Artery: Large caliber vessel that courses to the apex. 80-90% proximal stenosis. The mid and distal vessel has serial 30% stenoses. The first diagonal branch is small to moderate in caliber with 99% stenosis in the distal portion of the vessel where it becomes very small caliber (1.0 mm).  Circumflex Artery: Large caliber vessel with early moderate caliber intermediate branch with mild plaque. The  continuation of the AV groove Circumflex is small to moderate in caliber with mild diffuse plaque.  Right Coronary Artery: Large dominant vessel with mild proximal plaque, 90% mid stenosis, 100% occlusion distal vessel before the bifurcation.  Left Ventricular Angiogram: LVEF 55-60%.   EKG:  EKG is ordered today. The ekg ordered today demonstrates NSR, rate 89 bpm. Non-specific T wave abn  Recent Labs: No results found for requested labs within last 8760 hours.   Lipid Panel    Component Value Date/Time   CHOL 145 10/14/2015 0822   TRIG 114 10/14/2015 0822   HDL 37 (L) 10/14/2015 0822   CHOLHDL 3.9 10/14/2015 1610  VLDL 23 10/14/2015 0822   LDLCALC 85 10/14/2015 0822     Wt Readings from Last 3 Encounters:  04/16/18 275 lb 1.9 oz (124.8 kg)  12/02/16 267 lb 6.4 oz (121.3 kg)  01/03/16 258 lb (117 kg)     Other studies Reviewed: Additional studies/ records that were reviewed today include: . Review of the above records demonstrates:    Assessment and Plan:   1. CAD without angina: No chest pain. Will continue Plavix, statin and beta blocker.  Will stop ASA.   2. Tobacco abuse, in remission: He no longer smokes.   3. Hyperlipidemia: LDL near goal in 2017. He did not come back for the lipid testing as planned last year. Repeat lipids and LFTs now. Continue statin.   Current medicines are reviewed at length with the patient today.  The patient does not have concerns regarding medicines.  The following changes have been made:  no change  Labs/ tests ordered today include:   Orders Placed This Encounter  Procedures  . Lipid Profile  . Hepatic function panel  . EKG 12-Lead    Disposition:   FU with me in 12  months  Signed, Verne Carrow, MD 04/16/2018 3:47 PM    [facility] [address] [place], [place], [place]  [address] Phone: [phone]; Fax: [phone]

## 2018-04-16 NOTE — Patient Instructions (Signed)
Medication Instructions:  Your physician has recommended you make the following change in your medication:  Stop aspirin   Labwork: Your physician recommends that you return for lab work on September 27,2019.  The lab opens at 7:30 AM.  This will be fasting--Lipid and liver profiles   Testing/Procedures: none  Follow-Up: Your physician recommends that you schedule a follow-up appointment in: 12 months. Please call our office in about 8 months to schedule this appointment    Any Other Special Instructions Will Be Listed Below (If Applicable).     If you need a refill on your cardiac medications before your next appointment, please call your pharmacy.

## 2018-04-20 ENCOUNTER — Other Ambulatory Visit: Payer: PRIVATE HEALTH INSURANCE | Admitting: *Deleted

## 2018-04-20 DIAGNOSIS — E7800 Pure hypercholesterolemia, unspecified: Secondary | ICD-10-CM

## 2018-04-20 DIAGNOSIS — I2510 Atherosclerotic heart disease of native coronary artery without angina pectoris: Secondary | ICD-10-CM

## 2018-04-20 LAB — LIPID PANEL
CHOLESTEROL TOTAL: 152 mg/dL (ref 100–199)
Chol/HDL Ratio: 4 ratio (ref 0.0–5.0)
HDL: 38 mg/dL — AB (ref >39)
LDL CALC: 92 mg/dL (ref 0–99)
TRIGLYCERIDES: 111 mg/dL (ref 0–149)
VLDL CHOLESTEROL CAL: 22 mg/dL (ref 5–40)

## 2018-04-20 LAB — HEPATIC FUNCTION PANEL
ALBUMIN: 4 g/dL (ref 3.5–5.5)
ALK PHOS: 91 IU/L (ref 39–117)
ALT: 21 IU/L (ref 0–44)
AST: 16 IU/L (ref 0–40)
Bilirubin Total: 0.7 mg/dL (ref 0.0–1.2)
Bilirubin, Direct: 0.15 mg/dL (ref 0.00–0.40)
Total Protein: 6.7 g/dL (ref 6.0–8.5)

## 2018-04-23 ENCOUNTER — Other Ambulatory Visit: Payer: Self-pay | Admitting: *Deleted

## 2018-04-23 DIAGNOSIS — E7800 Pure hypercholesterolemia, unspecified: Secondary | ICD-10-CM

## 2018-04-23 MED ORDER — METOPROLOL TARTRATE 25 MG PO TABS: 25.0000 mg | ORAL_TABLET | Freq: Two times a day (BID) | ORAL | 3 refills | Status: DC

## 2018-04-23 MED ORDER — EZETIMIBE 10 MG PO TABS: 10.0000 mg | ORAL_TABLET | Freq: Every day | ORAL | 3 refills | Status: DC

## 2018-04-23 NOTE — Progress Notes (Signed)
Notes recorded by Dossie Arbour, RN on 04/23/2018 at 12:17 PM EDT Pt notified. [facility] [address]. He will come in for fasting lipid profile on 07/13/18 ------  Notes recorded by Kathleene Hazel, MD on 04/23/2018 at 9:43 AM EDT LDL not at goal of 70. I would like to add Zetia 10 mg daily. Repeat lipids in 12 weeks. ------

## 2018-07-13 ENCOUNTER — Other Ambulatory Visit: Payer: PRIVATE HEALTH INSURANCE

## 2018-07-13 DIAGNOSIS — E7800 Pure hypercholesterolemia, unspecified: Secondary | ICD-10-CM

## 2018-07-13 LAB — LIPID PANEL
CHOLESTEROL TOTAL: 111 mg/dL (ref 100–199)
Chol/HDL Ratio: 2.8 ratio (ref 0.0–5.0)
HDL: 40 mg/dL (ref >39)
LDL Calculated: 55 mg/dL (ref 0–99)
TRIGLYCERIDES: 82 mg/dL (ref 0–149)
VLDL CHOLESTEROL CAL: 16 mg/dL (ref 5–40)

## 2018-09-28 ENCOUNTER — Other Ambulatory Visit: Payer: Self-pay | Admitting: Cardiovascular Disease

## 2018-12-18 ENCOUNTER — Other Ambulatory Visit: Payer: Self-pay | Admitting: Cardiovascular Disease

## 2018-12-18 MED ORDER — NITROGLYCERIN 0.4 MG SL SUBL: 0.4000 mg | SUBLINGUAL_TABLET | SUBLINGUAL | 3 refills | Status: DC | PRN

## 2019-03-30 ENCOUNTER — Other Ambulatory Visit: Payer: Self-pay | Admitting: Cardiovascular Disease

## 2019-04-03 ENCOUNTER — Other Ambulatory Visit: Payer: Self-pay | Admitting: Cardiovascular Disease

## 2019-06-03 ENCOUNTER — Ambulatory Visit: Payer: PRIVATE HEALTH INSURANCE | Admitting: Cardiovascular Disease

## 2019-06-12 ENCOUNTER — Other Ambulatory Visit: Payer: Self-pay | Admitting: Cardiovascular Disease

## 2019-06-12 MED ORDER — EZETIMIBE 10 MG PO TABS: 10.0000 mg | ORAL_TABLET | Freq: Every day | ORAL | 0 refills | Status: DC

## 2019-06-12 MED ORDER — ROSUVASTATIN CALCIUM 40 MG PO TABS: 40.0000 mg | ORAL_TABLET | Freq: Every day | ORAL | 0 refills | Status: DC

## 2019-06-12 MED ORDER — NITROGLYCERIN 0.4 MG SL SUBL: 0.4000 mg | SUBLINGUAL_TABLET | SUBLINGUAL | 2 refills | Status: DC | PRN

## 2019-06-12 MED ORDER — METOPROLOL TARTRATE 25 MG PO TABS: 25.0000 mg | ORAL_TABLET | Freq: Two times a day (BID) | ORAL | 0 refills | Status: DC

## 2019-06-12 MED ORDER — CLOPIDOGREL BISULFATE 75 MG PO TABS: 75.0000 mg | ORAL_TABLET | Freq: Every day | ORAL | 0 refills | Status: DC

## 2019-06-12 NOTE — Telephone Encounter (Signed)
Pt's medications were sent to pt's pharmacy as requested. Confirmation received.  

## 2019-08-06 NOTE — Progress Notes (Signed)
Chief Complaint  Patient presents with  . Follow-up    CAD   History of Present Illness: 54 yo male with history of CAD, tobacco abuse, HLD here today for cardiac follow up. He was admitted to [facility] 11/19/12 with an inferior STEMI. Cardiac cath 11/19/12 with 90% mid RCA stenosis, 100% distal RCA stenosis. The distal vessel was treated with overlapping drug eluting stents and the mid vessel was treated with a drug eluting stent. He was brought back 2012/12/08 for staged PCI of the severe ostial LAD stenosis treated with a DES x 1. He had residual high grade disease in the small caliber diagonal branch which was felt to be too small for PCI. LVEF 55% by echo post cath. ABI normal March 2016. Exercise stress test without ischemia 09/15/14.   He is here today for follow up. The patient denies any chest pain, dyspnea, palpitations, lower extremity edema, orthopnea, PND, dizziness, near syncope or syncope.     Primary Care Physician: Laurey Morale, MD  Past Medical History:  Diagnosis Date  . Coronary artery disease    a. 10/2012 Inf STEMI/Cath/PCI: LM 20p, LAD 80-90p, 46m/d, D1 sm 99d, LCX mild plaque, RCA 100d (2.75x16 Promus Premier DES into PDA, 3.25x20 Promus Prem DES dRCA, 3.5x12 Promus Prem DES mRCA), EF 55-60%;  b. 10/2012 Staged PCI of LAD: 3.5x12 Promus Prem DES.  [name] Hyperlipidemia   . Leg pain    ABIs 3/16: Normal  . Nephrolithiasis    "passed on it's own" (12-08-12)  . NSVT (nonsustained ventricular tachycardia) (HCC)    a. in setting of MI 10/2012 - Echo: EF 55%, mild LVH.  . Osgood-Schlatter's disease [phone]   "right knee" (Dec 08, 2012)    Past Surgical History:  Procedure Laterality Date  . BACK SURGERY  1995  . LEFT HEART CATHETERIZATION WITH CORONARY ANGIOGRAM N/A 11/19/2012   Procedure: LEFT HEART CATHETERIZATION WITH CORONARY ANGIOGRAM;  Surgeon: Burnell Blanks, MD;  Location: [facility] CATH LAB;  Service: Cardiovascular;  Laterality: N/A;  . LUMBAR [place]   "L5-S1  ruptured disc" (08-Dec-2012)  . PERCUTANEOUS CORONARY STENT INTERVENTION (PCI-S) N/A 08-Dec-2012   Procedure: PERCUTANEOUS CORONARY STENT INTERVENTION (PCI-S);  Surgeon: Burnell Blanks, MD;  Location: [facility] CATH LAB;  Service: Cardiovascular;  Laterality: N/A;  . ULNAR COLLATERAL LIGAMENT REPAIR Left 2012    Current Outpatient Medications  Medication Sig Dispense Refill  . clopidogrel (PLAVIX) 75 MG tablet Take 1 tablet (75 mg total) by mouth daily. Please keep upcoming appt in January before anymore refills. Final Attempt 90 tablet 0  . ezetimibe (ZETIA) 10 MG tablet Take 1 tablet (10 mg total) by mouth daily. Please keep upcoming appt in January before anymore refills. Final Attempt 90 tablet 0  . metoprolol tartrate (LOPRESSOR) 25 MG tablet Take 1 tablet (25 mg total) by mouth 2 (two) times daily. Please keep upcoming appt in January before anymore refills. Final Attempt 180 tablet 0  . nitroGLYCERIN (NITROSTAT) 0.4 MG SL tablet Place 1 tablet (0.4 mg total) under the tongue every 5 (five) minutes as needed for chest pain. 25 tablet 2  . rosuvastatin (CRESTOR) 40 MG tablet TAKE 1 TABLET (40 MG TOTAL) BY MOUTH DAILY. PLEASE KEEP UPCOMING APPT FOR FUTURE REFILLS. THANK YOU 90 tablet 0   No current facility-administered medications for this visit.    No Known Allergies  Social History   Socioeconomic History  . Marital status: Single    Spouse name: Not on file  . Number  of children: Not on file  . Years of education: Not on file  . Highest education level: Not on file  Occupational History  . Not on file  Tobacco Use  . Smoking status: Former Smoker    Packs/day: 0.50    Years: 18.00    Pack years: 9.00    Types: Cigarettes  . Smokeless tobacco: Never Used  . Tobacco comment: 11/21/2012 offered smoking sessation materials; pt declines  Substance and Sexual Activity  . Alcohol use: No  . Drug use: No  . Sexual activity: Yes  Other Topics Concern  . Not on file  Social  History Narrative  . Not on file   Social Determinants of Health   Financial Resource Strain:   . Difficulty of Paying Living Expenses: Not on file  Food Insecurity:   . Worried About Programme researcher, broadcasting/film/video in the Last Year: Not on file  . Ran Out of Food in the Last Year: Not on file  Transportation Needs:   . Lack of Transportation (Medical): Not on file  . Lack of Transportation (Non-Medical): Not on file  Physical Activity:   . Days of Exercise per Week: Not on file  . Minutes of Exercise per Session: Not on file  Stress:   . Feeling of Stress : Not on file  Social Connections:   . Frequency of Communication with Friends and Family: Not on file  . Frequency of Social Gatherings with Friends and Family: Not on file  . Attends Religious Services: Not on file  . Active Member of Clubs or Organizations: Not on file  . Attends Banker Meetings: Not on file  . Marital Status: Not on file  Intimate Partner Violence:   . Fear of Current or Ex-Partner: Not on file  . Emotionally Abused: Not on file  . Physically Abused: Not on file  . Sexually Abused: Not on file    Family History  Problem Relation Age of Onset  . CAD Mother     Review of Systems:  As stated in the HPI and otherwise negative.   BP 114/82   Pulse 63   Ht 6\' 2"  (1.88 m)   Wt 252 lb 3.2 oz (114.4 kg)   SpO2 97%   BMI 32.38 kg/m   Physical Examination:  General: Well developed, well nourished, NAD  HEENT: OP clear, mucus membranes moist  SKIN: warm, dry. No rashes. Neuro: No focal deficits  Musculoskeletal: Muscle strength 5/5 all ext  Psychiatric: Mood and affect normal  Neck: No JVD, no carotid bruits, no thyromegaly, no lymphadenopathy.  Lungs:Clear bilaterally, no wheezes, rhonci, crackles Cardiovascular: Regular rate and rhythm. No murmurs, gallops or rubs. Abdomen:Soft. Bowel sounds present. Non-tender.  Extremities: No lower extremity edema. Pulses are 2 + in the bilateral  DP/PT.  Cardiac cath 11/19/12: Left main: 20% proximal stenosis.  Left Anterior Descending Artery: Large caliber vessel that courses to the apex. 80-90% proximal stenosis. The mid and distal vessel has serial 30% stenoses. The first diagonal branch is small to moderate in caliber with 99% stenosis in the distal portion of the vessel where it becomes very small caliber (1.0 mm).  Circumflex Artery: Large caliber vessel with early moderate caliber intermediate branch with mild plaque. The continuation of the AV groove Circumflex is small to moderate in caliber with mild diffuse plaque.  Right Coronary Artery: Large dominant vessel with mild proximal plaque, 90% mid stenosis, 100% occlusion distal vessel before the bifurcation.  Left Ventricular Angiogram:  LVEF 55-60%.   EKG:  EKG is ordered today. The ekg ordered today demonstrates NSR  Recent Labs: No results found for requested labs within last 8760 hours.   Lipid Panel    Component Value Date/Time   CHOL 111 07/13/2018 0735   TRIG 82 07/13/2018 0735   HDL 40 07/13/2018 0735   CHOLHDL 2.8 07/13/2018 0735   CHOLHDL 3.9 10/14/2015 0822   VLDL 23 10/14/2015 0822   LDLCALC 55 07/13/2018 0735     Wt Readings from Last 3 Encounters:  08/07/19 252 lb 3.2 oz (114.4 kg)  04/16/18 275 lb 1.9 oz (124.8 kg)  12/02/16 267 lb 6.4 oz (121.3 kg)     Other studies Reviewed: Additional studies/ records that were reviewed today include: . Review of the above records demonstrates:    Assessment and Plan:   1. CAD without angina: No chest pain. Continue Plavix, beta blocker and statin.    2. Tobacco abuse, in remission: He no longer smokes.   3. Hyperlipidemia: LDL at goal in 2019. Will continue statin and repeat lipids and LFTs now.    Current medicines are reviewed at length with the patient today.  The patient does not have concerns regarding medicines.  The following changes have been made:  no change  Labs/ tests ordered today  include:   Orders Placed This Encounter  Procedures  . Lipid Profile  . Hepatic function panel  . EKG 12-Lead    Disposition:   FU with me in 12  months  Signed, Verne Carrow, MD 08/07/2019 10:01 AM    [facility] [address] [place], [place], [place]  [address] Phone: [phone]; Fax: [phone]

## 2019-08-07 ENCOUNTER — Ambulatory Visit (INDEPENDENT_AMBULATORY_CARE_PROVIDER_SITE_OTHER): Payer: PRIVATE HEALTH INSURANCE | Admitting: Cardiovascular Disease

## 2019-08-07 ENCOUNTER — Encounter: Payer: Self-pay | Admitting: Cardiovascular Disease

## 2019-08-07 ENCOUNTER — Other Ambulatory Visit: Payer: Self-pay

## 2019-08-07 VITALS — BP 114/82 | HR 63 | Ht 74.0 in | Wt 252.2 lb

## 2019-08-07 DIAGNOSIS — E7800 Pure hypercholesterolemia, unspecified: Secondary | ICD-10-CM | POA: Diagnosis not present

## 2019-08-07 DIAGNOSIS — I2510 Atherosclerotic heart disease of native coronary artery without angina pectoris: Secondary | ICD-10-CM | POA: Diagnosis not present

## 2019-08-07 LAB — LIPID PANEL
Chol/HDL Ratio: 2.9 ratio (ref 0.0–5.0)
Cholesterol, Total: 125 mg/dL (ref 100–199)
HDL: 43 mg/dL (ref >39)
LDL Chol Calc (NIH): 70 mg/dL (ref 0–99)
Triglycerides: 55 mg/dL (ref 0–149)
VLDL Cholesterol Cal: 12 mg/dL (ref 5–40)

## 2019-08-07 LAB — HEPATIC FUNCTION PANEL
ALT: 23 IU/L (ref 0–44)
AST: 19 IU/L (ref 0–40)
Albumin: 4.2 g/dL (ref 3.8–4.9)
Alkaline Phosphatase: 96 IU/L (ref 39–117)
Bilirubin Total: 0.8 mg/dL (ref 0.0–1.2)
Bilirubin, Direct: 0.21 mg/dL (ref 0.00–0.40)
Total Protein: 6.6 g/dL (ref 6.0–8.5)

## 2019-08-07 NOTE — Patient Instructions (Signed)
Medication Instructions:  No changes *If you need a refill on your cardiac medications before your next appointment, please call your pharmacy*  Lab Work: Today: lipid panel/liver function panel If you have labs (blood work) drawn today and your tests are completely normal, you will receive your results only by: [name] MyChart Message (if you have MyChart) OR . A paper copy in the mail If you have any lab test that is abnormal or we need to change your treatment, we will call you to review the results.  Testing/Procedures: none  Follow-Up: At [facility], you and your health needs are our priority.  As part of our continuing mission to provide you with exceptional heart care, we have created designated Provider Care Teams.  These Care Teams include your primary Cardiologist (physician) and Advanced Practice Providers (APPs -  Physician Assistants and Nurse Practitioners) who all work together to provide you with the care you need, when you need it.  Your next appointment:   12 month(s)  The format for your next appointment:   Either In Person or Virtual  Provider:   You may see Verne Carrow, MD or one of the following Advanced Practice Providers on your designated Care Team:    Ronie Spies, PA-C  Jacolyn Reedy, PA-C   Other Instructions

## 2019-09-15 ENCOUNTER — Other Ambulatory Visit: Payer: Self-pay | Admitting: Cardiovascular Disease

## 2019-09-16 ENCOUNTER — Other Ambulatory Visit: Payer: Self-pay | Admitting: Cardiovascular Disease

## 2019-12-17 ENCOUNTER — Other Ambulatory Visit: Payer: Self-pay | Admitting: Cardiovascular Disease

## 2020-05-20 ENCOUNTER — Other Ambulatory Visit: Payer: Self-pay

## 2020-05-20 ENCOUNTER — Ambulatory Visit
Admission: EM | Admit: 2020-05-20 | Discharge: 2020-05-20 | Disposition: A | Discharge status: Home / Self Care | Payer: 59 | Attending: Emergency Medicine | Admitting: Emergency Medicine

## 2020-05-20 DIAGNOSIS — Z1152 Encounter for screening for COVID-19: Secondary | ICD-10-CM

## 2020-05-20 DIAGNOSIS — R430 Anosmia: Secondary | ICD-10-CM

## 2020-05-20 DIAGNOSIS — R52 Pain, unspecified: Secondary | ICD-10-CM

## 2020-05-20 DIAGNOSIS — R432 Parageusia: Secondary | ICD-10-CM

## 2020-05-20 DIAGNOSIS — J069 Acute upper respiratory infection, unspecified: Secondary | ICD-10-CM

## 2020-05-20 MED ORDER — IBUPROFEN 800 MG PO TABS: 800.0000 mg | ORAL_TABLET | Freq: Three times a day (TID) | ORAL | 0 refills | Status: DC

## 2020-05-20 MED ORDER — BENZONATATE 100 MG PO CAPS: 100.0000 mg | ORAL_CAPSULE | Freq: Three times a day (TID) | ORAL | 0 refills | Status: DC

## 2020-05-20 MED ORDER — DEXAMETHASONE 4 MG PO TABS: 4.0000 mg | ORAL_TABLET | Freq: Every day | ORAL | 0 refills | Status: DC

## 2020-05-20 MED ORDER — FLUTICASONE PROPIONATE 50 MCG/ACT NA SUSP: 1.0000 | Freq: Every day | NASAL | 0 refills | Status: DC

## 2020-05-20 NOTE — ED Provider Notes (Signed)
[facility] CARE CENTER   [id number] 05/20/20 Arrival Time: 0936   CC: COVID symptoms  SUBJECTIVE: History from: patient.  Adam Haney is a 54 y.o. male who presented to the urgent care for complaint of mild cough, headache, body ache, loss of taste and smell that started last night.  Denies sick exposure to COVID, flu or strep.  Denies recent travel.  Has tried OTC medication without relief.  Denies aggravating factors.  Denies previous symptoms in the past.   Denies fever, chills, fatigue, sinus pain, rhinorrhea, sore throat, SOB, wheezing, chest pain, nausea, changes in bowel or bladder habits.    ROS: As per HPI.  All other pertinent ROS negative.      Past Medical History:  Diagnosis Date  . Coronary artery disease    a. 10/2012 Inf STEMI/Cath/PCI: LM 20p, LAD 80-90p, 35m/d, D1 sm 99d, LCX mild plaque, RCA 100d (2.75x16 Promus Premier DES into PDA, 3.25x20 Promus Prem DES dRCA, 3.5x12 Promus Prem DES mRCA), EF 55-60%;  b. 10/2012 Staged PCI of LAD: 3.5x12 Promus Prem DES.  [name] Hyperlipidemia   . Leg pain    ABIs 3/16: Normal  . Nephrolithiasis    "passed on it's own" (12/14/2012)  . NSVT (nonsustained ventricular tachycardia) (HCC)    a. in setting of MI 10/2012 - Echo: EF 55%, mild LVH.  . Osgood-Schlatter's disease [phone]   "right knee" (2012/12/14)   Past Surgical History:  Procedure Laterality Date  . BACK SURGERY  1995  . LEFT HEART CATHETERIZATION WITH CORONARY ANGIOGRAM N/A 11/19/2012   Procedure: LEFT HEART CATHETERIZATION WITH CORONARY ANGIOGRAM;  Surgeon: Kathleene Hazel, MD;  Location: [facility] CATH LAB;  Service: Cardiovascular;  Laterality: N/A;  . LUMBAR DISC SURGERY  1995   "L5-S1 ruptured disc" (Dec 14, 2012)  . PERCUTANEOUS CORONARY STENT INTERVENTION (PCI-S) N/A Dec 14, 2012   Procedure: PERCUTANEOUS CORONARY STENT INTERVENTION (PCI-S);  Surgeon: Kathleene Hazel, MD;  Location: [facility] CATH LAB;  Service: Cardiovascular;  Laterality: N/A;  . ULNAR COLLATERAL  LIGAMENT REPAIR Left 2012   No Known Allergies No current facility-administered medications on file prior to encounter.   Current Outpatient Medications on File Prior to Encounter  Medication Sig Dispense Refill  . clopidogrel (PLAVIX) 75 MG tablet TAKE 1 TABLET DAILY. PLEASE KEEP UPCOMING APPT IN JANUARY BEFORE ANYMORE REFILLS. FINAL ATTEMPT 90 tablet 3  . ezetimibe (ZETIA) 10 MG tablet Take 1 tablet (10 mg total) by mouth daily. 90 tablet 1  . metoprolol tartrate (LOPRESSOR) 25 MG tablet Take 1 tablet (25 mg total) by mouth 2 (two) times daily. 180 tablet 3  . nitroGLYCERIN (NITROSTAT) 0.4 MG SL tablet Place 1 tablet (0.4 mg total) under the tongue every 5 (five) minutes as needed for chest pain. 25 tablet 2  . rosuvastatin (CRESTOR) 40 MG tablet Take 1 tablet (40 mg total) by mouth daily. 90 tablet 3   Social History   Socioeconomic History  . Marital status: Single    Spouse name: Not on file  . Number of children: Not on file  . Years of education: Not on file  . Highest education level: Not on file  Occupational History  . Not on file  Tobacco Use  . Smoking status: Former Smoker    Packs/day: 0.50    Years: 18.00    Pack years: 9.00    Types: Cigarettes  . Smokeless tobacco: Never Used  . Tobacco comment: 12-14-2012 offered smoking sessation materials; pt declines  Vaping Use  . Vaping Use: Never used  Substance and Sexual Activity  . Alcohol use: No  . Drug use: No  . Sexual activity: Yes  Other Topics Concern  . Not on file  Social History Narrative  . Not on file   Social Determinants of Health   Financial Resource Strain:   . Difficulty of Paying Living Expenses: Not on file  Food Insecurity:   . Worried About Programme researcher, broadcasting/film/video in the Last Year: Not on file  . Ran Out of Food in the Last Year: Not on file  Transportation Needs:   . Lack of Transportation (Medical): Not on file  . Lack of Transportation (Non-Medical): Not on file  Physical Activity:   .  Days of Exercise per Week: Not on file  . Minutes of Exercise per Session: Not on file  Stress:   . Feeling of Stress : Not on file  Social Connections:   . Frequency of Communication with Friends and Family: Not on file  . Frequency of Social Gatherings with Friends and Family: Not on file  . Attends Religious Services: Not on file  . Active Member of Clubs or Organizations: Not on file  . Attends Banker Meetings: Not on file  . Marital Status: Not on file  Intimate Partner Violence:   . Fear of Current or Ex-Partner: Not on file  . Emotionally Abused: Not on file  . Physically Abused: Not on file  . Sexually Abused: Not on file   Family History  Problem Relation Age of Onset  . CAD Mother     OBJECTIVE:  Vitals:   05/20/20 0953  BP: 107/72  Pulse: 95  Resp: 20  Temp: 98.8 F (37.1 C)  SpO2: 91%     General appearance: alert; appears fatigued, but nontoxic; speaking in full sentences and tolerating own secretions HEENT: NCAT; Ears: EACs clear, TMs pearly gray; Eyes: PERRL.  EOM grossly intact. Sinuses: nontender; Nose: nares patent without rhinorrhea, Throat: oropharynx clear, tonsils non erythematous or enlarged, uvula midline  Neck: supple without LAD Lungs: unlabored respirations, symmetrical air entry; cough: present; no respiratory distress; CTAB Heart: regular rate and rhythm.  Radial pulses 2+ symmetrical bilaterally Skin: warm and dry Psychological: alert and cooperative; normal mood and affect  LABS:  No results found for this or any previous visit (from the past 24 hour(s)).   ASSESSMENT & PLAN:  1. Encounter for screening for COVID-19   2. Body aches   3. Loss of taste   4. Loss of smell   5. Viral URI with cough     Meds ordered this encounter  Medications  . dexamethasone (DECADRON) 4 MG tablet    Sig: Take 1 tablet (4 mg total) by mouth daily for 7 days.    Dispense:  7 tablet    Refill:  0  . benzonatate (TESSALON) 100 MG  capsule    Sig: Take 1 capsule (100 mg total) by mouth every 8 (eight) hours.    Dispense:  30 capsule    Refill:  0  . fluticasone (FLONASE) 50 MCG/ACT nasal spray    Sig: Place 1 spray into both nostrils daily for 14 days.    Dispense:  16 g    Refill:  0    Discharge instructions  COVID-19, RSV, flu A/B testing ordered.  It will take between 2-7 days for test results.  Someone will contact you regarding abnormal results.    In the meantime: You should remain isolated in your home for 10  days from symptom onset AND greater than 24 hours after symptoms resolution (absence of fever without the use of fever-reducing medication and improvement in respiratory symptoms), whichever is longer Get plenty of rest and push fluids Tessalon Perles prescribed for cough Flonase for nasal congestion and runny nose Decadron was prescribed Ibuprofen 800 mg was prescribed for headache and body aches Use medications daily for symptom relief Use OTC medications like ibuprofen or tylenol as needed fever or pain Call or go to the ED if you have any new or worsening symptoms such as fever, worsening cough, shortness of breath, chest tightness, chest pain, turning blue, changes in mental status, etc...   Reviewed expectations re: course of current medical issues. Questions answered. Outlined signs and symptoms indicating need for more acute intervention. Patient verbalized understanding. After Visit Summary given.         Durward Parcel, FNP 05/20/20 1016

## 2020-05-20 NOTE — ED Triage Notes (Signed)
Pt presents with c/o headache body aches that began suddenly last night, developed cough and loss of taste and smell

## 2020-05-20 NOTE — Discharge Instructions (Addendum)
COVID-19, RSV, flu A/B testing ordered.  It will take between 2-7 days for test results.  Someone will contact you regarding abnormal results.    In the meantime: You should remain isolated in your home for 10 days from symptom onset AND greater than 24 hours after symptoms resolution (absence of fever without the use of fever-reducing medication and improvement in respiratory symptoms), whichever is longer Get plenty of rest and push fluids Tessalon Perles prescribed for cough Flonase for nasal congestion and runny nose Decadron was prescribed Ibuprofen 800 mg was prescribed for headache and body aches Use medications daily for symptom relief Use OTC medications like ibuprofen or tylenol as needed fever or pain Call or go to the ED if you have any new or worsening symptoms such as fever, worsening cough, shortness of breath, chest tightness, chest pain, turning blue, changes in mental status, etc..[name]

## 2020-05-22 LAB — COVID-19, FLU A+B AND RSV
Influenza A, NAA: NOT DETECTED
Influenza B, NAA: NOT DETECTED
RSV, NAA: NOT DETECTED
SARS-CoV-2, NAA: NOT DETECTED

## 2020-05-25 ENCOUNTER — Inpatient Hospital Stay (HOSPITAL_COMMUNITY): Payer: 59

## 2020-05-25 ENCOUNTER — Inpatient Hospital Stay (HOSPITAL_COMMUNITY)
Admission: EM | Admit: 2020-05-25 | Discharge: 2020-06-02 | DRG: 854 | Disposition: A | Discharge status: Home Health | Payer: 59 | Attending: Surgery | Admitting: Surgery

## 2020-05-25 ENCOUNTER — Encounter (HOSPITAL_COMMUNITY): Payer: Self-pay | Admitting: *Deleted

## 2020-05-25 ENCOUNTER — Other Ambulatory Visit: Payer: Self-pay

## 2020-05-25 ENCOUNTER — Encounter (HOSPITAL_COMMUNITY): Admission: EM | Disposition: A | Discharge status: Home Health | Payer: Self-pay | Source: Home / Self Care

## 2020-05-25 ENCOUNTER — Inpatient Hospital Stay (HOSPITAL_COMMUNITY): Payer: 59 | Admitting: Anesthesiology

## 2020-05-25 ENCOUNTER — Emergency Department (HOSPITAL_COMMUNITY): Payer: 59

## 2020-05-25 DIAGNOSIS — E1165 Type 2 diabetes mellitus with hyperglycemia: Secondary | ICD-10-CM | POA: Diagnosis present

## 2020-05-25 DIAGNOSIS — R748 Abnormal levels of other serum enzymes: Secondary | ICD-10-CM

## 2020-05-25 DIAGNOSIS — K760 Fatty (change of) liver, not elsewhere classified: Secondary | ICD-10-CM | POA: Diagnosis present

## 2020-05-25 DIAGNOSIS — E6609 Other obesity due to excess calories: Secondary | ICD-10-CM | POA: Diagnosis present

## 2020-05-25 DIAGNOSIS — E785 Hyperlipidemia, unspecified: Secondary | ICD-10-CM | POA: Diagnosis present

## 2020-05-25 DIAGNOSIS — E871 Hypo-osmolality and hyponatremia: Secondary | ICD-10-CM | POA: Diagnosis present

## 2020-05-25 DIAGNOSIS — R739 Hyperglycemia, unspecified: Secondary | ICD-10-CM | POA: Diagnosis present

## 2020-05-25 DIAGNOSIS — Z720 Tobacco use: Secondary | ICD-10-CM | POA: Diagnosis not present

## 2020-05-25 DIAGNOSIS — M7989 Other specified soft tissue disorders: Secondary | ICD-10-CM | POA: Diagnosis not present

## 2020-05-25 DIAGNOSIS — N493 Fournier gangrene: Secondary | ICD-10-CM | POA: Diagnosis present

## 2020-05-25 DIAGNOSIS — I252 Old myocardial infarction: Secondary | ICD-10-CM

## 2020-05-25 DIAGNOSIS — R6520 Severe sepsis without septic shock: Secondary | ICD-10-CM | POA: Diagnosis present

## 2020-05-25 DIAGNOSIS — Z79899 Other long term (current) drug therapy: Secondary | ICD-10-CM | POA: Diagnosis not present

## 2020-05-25 DIAGNOSIS — Z7902 Long term (current) use of antithrombotics/antiplatelets: Secondary | ICD-10-CM | POA: Diagnosis not present

## 2020-05-25 DIAGNOSIS — Z87442 Personal history of urinary calculi: Secondary | ICD-10-CM

## 2020-05-25 DIAGNOSIS — A419 Sepsis, unspecified organism: Secondary | ICD-10-CM | POA: Diagnosis present

## 2020-05-25 DIAGNOSIS — Z955 Presence of coronary angioplasty implant and graft: Secondary | ICD-10-CM

## 2020-05-25 DIAGNOSIS — L03314 Cellulitis of groin: Principal | ICD-10-CM | POA: Diagnosis present

## 2020-05-25 DIAGNOSIS — Z8249 Family history of ischemic heart disease and other diseases of the circulatory system: Secondary | ICD-10-CM | POA: Diagnosis not present

## 2020-05-25 DIAGNOSIS — R1031 Right lower quadrant pain: Secondary | ICD-10-CM | POA: Diagnosis present

## 2020-05-25 DIAGNOSIS — B370 Candidal stomatitis: Secondary | ICD-10-CM | POA: Diagnosis not present

## 2020-05-25 DIAGNOSIS — Z6832 Body mass index (BMI) 32.0-32.9, adult: Secondary | ICD-10-CM | POA: Diagnosis not present

## 2020-05-25 DIAGNOSIS — I2510 Atherosclerotic heart disease of native coronary artery without angina pectoris: Secondary | ICD-10-CM | POA: Diagnosis present

## 2020-05-25 DIAGNOSIS — Z20822 Contact with and (suspected) exposure to covid-19: Secondary | ICD-10-CM | POA: Diagnosis present

## 2020-05-25 DIAGNOSIS — Z419 Encounter for procedure for purposes other than remedying health state, unspecified: Secondary | ICD-10-CM

## 2020-05-25 HISTORY — PX: IRRIGATION AND DEBRIDEMENT ABSCESS: SHX5252

## 2020-05-25 LAB — COMPREHENSIVE METABOLIC PANEL
ALT: 190 U/L — ABNORMAL HIGH (ref 0–44)
AST: 200 U/L — ABNORMAL HIGH (ref 15–41)
Albumin: 2.5 g/dL — ABNORMAL LOW (ref 3.5–5.0)
Alkaline Phosphatase: 135 U/L — ABNORMAL HIGH (ref 38–126)
Anion gap: 14 (ref 5–15)
BUN: 19 mg/dL (ref 6–20)
CO2: 20 mmol/L — ABNORMAL LOW (ref 22–32)
Calcium: 8.6 mg/dL — ABNORMAL LOW (ref 8.9–10.3)
Chloride: 98 mmol/L (ref 98–111)
Creatinine, Ser: 0.85 mg/dL (ref 0.61–1.24)
GFR, Estimated: >60 mL/min (ref >60)
Glucose, Bld: 219 mg/dL — ABNORMAL HIGH (ref 70–99)
Potassium: 3.5 mmol/L (ref 3.5–5.1)
Sodium: 132 mmol/L — ABNORMAL LOW (ref 135–145)
Total Bilirubin: 1 mg/dL (ref 0.3–1.2)
Total Protein: 7 g/dL (ref 6.5–8.1)

## 2020-05-25 LAB — RESPIRATORY PANEL BY RT PCR (FLU A&B, COVID)
Influenza A by PCR: NEGATIVE
Influenza B by PCR: NEGATIVE
SARS Coronavirus 2 by RT PCR: NEGATIVE

## 2020-05-25 LAB — CBC
HCT: 41.1 % (ref 39.0–52.0)
Hemoglobin: 13.9 g/dL (ref 13.0–17.0)
MCH: 30.4 pg (ref 26.0–34.0)
MCHC: 33.8 g/dL (ref 30.0–36.0)
MCV: 89.9 fL (ref 80.0–100.0)
Platelets: 270 10*3/uL (ref 150–400)
RBC: 4.57 MIL/uL (ref 4.22–5.81)
RDW: 13.5 % (ref 11.5–15.5)
WBC: 23.3 10*3/uL — ABNORMAL HIGH (ref 4.0–10.5)
nRBC: 0 % (ref 0.0–0.2)

## 2020-05-25 LAB — HEMOGLOBIN A1C
Hgb A1c MFr Bld: 6.2 % — ABNORMAL HIGH (ref 4.8–5.6)
Mean Plasma Glucose: 131.24 mg/dL

## 2020-05-25 LAB — LACTIC ACID, PLASMA
Lactic Acid, Venous: 1.8 mmol/L (ref 0.5–1.9)
Lactic Acid, Venous: 1.6 mmol/L (ref 0.5–1.9)

## 2020-05-25 SURGERY — IRRIGATION AND DEBRIDEMENT ABSCESS
Anesthesia: General | Site: Groin | Laterality: Right

## 2020-05-25 MED ORDER — SODIUM CHLORIDE 0.9 % IR SOLN
Status: DC | PRN
  Administered 2020-05-25: 3000 mL

## 2020-05-25 MED ORDER — PROPOFOL 10 MG/ML IV BOLUS
INTRAVENOUS | Status: DC | PRN
  Administered 2020-05-25: 200 mg via INTRAVENOUS

## 2020-05-25 MED ORDER — LACTATED RINGERS IV BOLUS
1000.0000 mL | Freq: Once | INTRAVENOUS | Status: AC
  Administered 2020-05-25: 1000 mL via INTRAVENOUS

## 2020-05-25 MED ORDER — ENOXAPARIN SODIUM 40 MG/0.4ML ~~LOC~~ SOLN
40.0000 mg | SUBCUTANEOUS | Status: DC
  Administered 2020-05-26: 40 mg via SUBCUTANEOUS
  Filled 2020-05-25: qty 0.4

## 2020-05-25 MED ORDER — MIDAZOLAM HCL 2 MG/2ML IJ SOLN
INTRAMUSCULAR | Status: AC
  Filled 2020-05-25: qty 2

## 2020-05-25 MED ORDER — SUGAMMADEX SODIUM 200 MG/2ML IV SOLN: INTRAVENOUS | Status: DC | PRN

## 2020-05-25 MED ORDER — ONDANSETRON HCL 4 MG/2ML IJ SOLN
INTRAMUSCULAR | Status: DC | PRN
  Administered 2020-05-25: 4 mg via INTRAVENOUS

## 2020-05-25 MED ORDER — VANCOMYCIN HCL 1750 MG/350ML IV SOLN
1750.0000 mg | Freq: Once | INTRAVENOUS | Status: AC
  Administered 2020-05-25: 1750 mg via INTRAVENOUS
  Filled 2020-05-25: qty 350

## 2020-05-25 MED ORDER — ROSUVASTATIN CALCIUM 20 MG PO TABS
40.0000 mg | ORAL_TABLET | Freq: Every day | ORAL | Status: DC
  Administered 2020-05-26: 40 mg via ORAL
  Filled 2020-05-25 (×2): qty 2

## 2020-05-25 MED ORDER — EZETIMIBE 10 MG PO TABS
10.0000 mg | ORAL_TABLET | Freq: Every day | ORAL | Status: DC
  Administered 2020-05-26: 10 mg via ORAL
  Filled 2020-05-25 (×2): qty 1

## 2020-05-25 MED ORDER — 0.9 % SODIUM CHLORIDE (POUR BTL) OPTIME
TOPICAL | Status: DC | PRN
  Administered 2020-05-25: 1000 mL

## 2020-05-25 MED ORDER — FENTANYL CITRATE (PF) 100 MCG/2ML IJ SOLN
50.0000 ug | INTRAMUSCULAR | Status: DC | PRN
  Administered 2020-05-25: 50 ug via INTRAVENOUS
  Filled 2020-05-25: qty 2

## 2020-05-25 MED ORDER — ACETAMINOPHEN 650 MG RE SUPP: 650.0000 mg | Freq: Four times a day (QID) | RECTAL | Status: DC | PRN

## 2020-05-25 MED ORDER — SUGAMMADEX SODIUM 500 MG/5ML IV SOLN
INTRAVENOUS | Status: AC
  Filled 2020-05-25: qty 5

## 2020-05-25 MED ORDER — METOPROLOL TARTRATE 25 MG PO TABS: 25.0000 mg | ORAL_TABLET | Freq: Once | ORAL | Status: AC

## 2020-05-25 MED ORDER — ROCURONIUM BROMIDE 10 MG/ML (PF) SYRINGE
PREFILLED_SYRINGE | INTRAVENOUS | Status: DC | PRN
  Administered 2020-05-25: 10 mg via INTRAVENOUS
  Administered 2020-05-25: 40 mg via INTRAVENOUS

## 2020-05-25 MED ORDER — PHENYLEPHRINE HCL-NACL 10-0.9 MG/250ML-% IV SOLN
INTRAVENOUS | Status: DC | PRN
  Administered 2020-05-25: 75 ug/min via INTRAVENOUS

## 2020-05-25 MED ORDER — METOPROLOL TARTRATE 25 MG PO TABS
25.0000 mg | ORAL_TABLET | Freq: Two times a day (BID) | ORAL | Status: DC
  Administered 2020-05-25 – 2020-06-02 (×15): 25 mg via ORAL
  Filled 2020-05-25 (×16): qty 1

## 2020-05-25 MED ORDER — MORPHINE SULFATE (PF) 2 MG/ML IV SOLN: 2.0000 mg | INTRAVENOUS | Status: DC | PRN

## 2020-05-25 MED ORDER — CLINDAMYCIN PHOSPHATE 600 MG/50ML IV SOLN
600.0000 mg | Freq: Four times a day (QID) | INTRAVENOUS | Status: DC
  Administered 2020-05-25 – 2020-05-28 (×11): 600 mg via INTRAVENOUS
  Filled 2020-05-25 (×13): qty 50

## 2020-05-25 MED ORDER — ACETAMINOPHEN 325 MG PO TABS
650.0000 mg | ORAL_TABLET | Freq: Four times a day (QID) | ORAL | Status: DC | PRN
  Administered 2020-05-25: 650 mg via ORAL
  Filled 2020-05-25: qty 2

## 2020-05-25 MED ORDER — NALOXONE HCL 0.4 MG/ML IJ SOLN: 0.4000 mg | INTRAMUSCULAR | Status: DC | PRN

## 2020-05-25 MED ORDER — CLINDAMYCIN PHOSPHATE 900 MG/50ML IV SOLN
900.0000 mg | Freq: Once | INTRAVENOUS | Status: AC
  Administered 2020-05-25: 900 mg via INTRAVENOUS
  Filled 2020-05-25: qty 50

## 2020-05-25 MED ORDER — CHLORHEXIDINE GLUCONATE CLOTH 2 % EX PADS
6.0000 | MEDICATED_PAD | Freq: Every day | CUTANEOUS | Status: DC
  Administered 2020-05-26 – 2020-06-02 (×7): 6 via TOPICAL

## 2020-05-25 MED ORDER — MORPHINE SULFATE 2 MG/ML IV SOLN
INTRAVENOUS | Status: DC
  Filled 2020-05-25 (×3): qty 25
  Filled 2020-05-25: qty 50
  Filled 2020-05-25: qty 40
  Filled 2020-05-25: qty 30
  Filled 2020-05-25 (×2): qty 25

## 2020-05-25 MED ORDER — LIDOCAINE 2% (20 MG/ML) 5 ML SYRINGE
INTRAMUSCULAR | Status: AC
  Filled 2020-05-25: qty 5

## 2020-05-25 MED ORDER — METOPROLOL TARTRATE 12.5 MG HALF TABLET
ORAL_TABLET | ORAL | Status: AC
  Administered 2020-05-25: 25 mg via ORAL
  Filled 2020-05-25: qty 2

## 2020-05-25 MED ORDER — FENTANYL CITRATE (PF) 100 MCG/2ML IJ SOLN
25.0000 ug | INTRAMUSCULAR | Status: DC | PRN
  Administered 2020-05-25: 50 ug via INTRAVENOUS

## 2020-05-25 MED ORDER — PIPERACILLIN-TAZOBACTAM 3.375 G IVPB 30 MIN
3.3750 g | Freq: Once | INTRAVENOUS | Status: AC
  Administered 2020-05-25: 3.375 g via INTRAVENOUS
  Filled 2020-05-25: qty 50

## 2020-05-25 MED ORDER — ONDANSETRON 4 MG PO TBDP: 4.0000 mg | ORAL_TABLET | Freq: Four times a day (QID) | ORAL | Status: DC | PRN

## 2020-05-25 MED ORDER — FENTANYL CITRATE (PF) 250 MCG/5ML IJ SOLN
INTRAMUSCULAR | Status: AC
  Filled 2020-05-25: qty 5

## 2020-05-25 MED ORDER — ONDANSETRON HCL 4 MG/2ML IJ SOLN: 4.0000 mg | Freq: Four times a day (QID) | INTRAMUSCULAR | Status: DC | PRN

## 2020-05-25 MED ORDER — MIDAZOLAM HCL 5 MG/5ML IJ SOLN
INTRAMUSCULAR | Status: DC | PRN
  Administered 2020-05-25: 2 mg via INTRAVENOUS

## 2020-05-25 MED ORDER — PIPERACILLIN-TAZOBACTAM 3.375 G IVPB 30 MIN
3.3750 g | Freq: Three times a day (TID) | INTRAVENOUS | Status: DC
  Administered 2020-05-25 – 2020-05-29 (×11): 3.375 g via INTRAVENOUS
  Filled 2020-05-25 (×20): qty 50

## 2020-05-25 MED ORDER — METOPROLOL TARTRATE 12.5 MG HALF TABLET
ORAL_TABLET | ORAL | Status: AC
  Filled 2020-05-25: qty 1

## 2020-05-25 MED ORDER — FENTANYL CITRATE (PF) 100 MCG/2ML IJ SOLN
INTRAMUSCULAR | Status: AC
  Administered 2020-05-25: 50 ug via INTRAVENOUS
  Filled 2020-05-25: qty 2

## 2020-05-25 MED ORDER — METOPROLOL TARTRATE 5 MG/5ML IV SOLN: 5.0000 mg | Freq: Four times a day (QID) | INTRAVENOUS | Status: DC | PRN

## 2020-05-25 MED ORDER — SIMETHICONE 80 MG PO CHEW: 40.0000 mg | CHEWABLE_TABLET | Freq: Four times a day (QID) | ORAL | Status: DC | PRN

## 2020-05-25 MED ORDER — SODIUM CHLORIDE 0.9% FLUSH: 9.0000 mL | INTRAVENOUS | Status: DC | PRN

## 2020-05-25 MED ORDER — SUCCINYLCHOLINE CHLORIDE 20 MG/ML IJ SOLN
INTRAMUSCULAR | Status: DC | PRN
  Administered 2020-05-25: 120 mg via INTRAVENOUS

## 2020-05-25 MED ORDER — INSULIN ASPART 100 UNIT/ML ~~LOC~~ SOLN: 0.0000 [IU] | Freq: Three times a day (TID) | SUBCUTANEOUS | Status: DC

## 2020-05-25 MED ORDER — IOHEXOL 300 MG/ML  SOLN
100.0000 mL | Freq: Once | INTRAMUSCULAR | Status: AC | PRN
  Administered 2020-05-25: 100 mL via INTRAVENOUS

## 2020-05-25 MED ORDER — LIDOCAINE 2% (20 MG/ML) 5 ML SYRINGE
INTRAMUSCULAR | Status: DC | PRN
  Administered 2020-05-25: 60 mg via INTRAVENOUS
  Administered 2020-05-25: 40 mg via INTRAVENOUS

## 2020-05-25 MED ORDER — OXYCODONE HCL 5 MG/5ML PO SOLN: 5.0000 mg | Freq: Once | ORAL | Status: DC | PRN

## 2020-05-25 MED ORDER — DIPHENHYDRAMINE HCL 25 MG PO CAPS: 25.0000 mg | ORAL_CAPSULE | Freq: Four times a day (QID) | ORAL | Status: DC | PRN

## 2020-05-25 MED ORDER — ROCURONIUM BROMIDE 10 MG/ML (PF) SYRINGE
PREFILLED_SYRINGE | INTRAVENOUS | Status: AC
  Filled 2020-05-25: qty 10

## 2020-05-25 MED ORDER — SUGAMMADEX SODIUM 200 MG/2ML IV SOLN
INTRAVENOUS | Status: DC | PRN
  Administered 2020-05-25: 200 mg via INTRAVENOUS

## 2020-05-25 MED ORDER — DEXAMETHASONE SODIUM PHOSPHATE 10 MG/ML IJ SOLN
INTRAMUSCULAR | Status: DC | PRN
  Administered 2020-05-25: 10 mg via INTRAVENOUS

## 2020-05-25 MED ORDER — SODIUM CHLORIDE 0.9 % IV SOLN: INTRAVENOUS | Status: DC

## 2020-05-25 MED ORDER — PROPOFOL 10 MG/ML IV BOLUS
INTRAVENOUS | Status: AC
  Filled 2020-05-25: qty 20

## 2020-05-25 MED ORDER — POLYETHYLENE GLYCOL 3350 17 G PO PACK: 17.0000 g | PACK | Freq: Every day | ORAL | Status: DC | PRN

## 2020-05-25 MED ORDER — MORPHINE SULFATE (PF) 4 MG/ML IV SOLN
4.0000 mg | Freq: Once | INTRAVENOUS | Status: AC
  Administered 2020-05-25: 4 mg via INTRAVENOUS
  Filled 2020-05-25: qty 1

## 2020-05-25 MED ORDER — VANCOMYCIN HCL IN DEXTROSE 1-5 GM/200ML-% IV SOLN
1000.0000 mg | Freq: Two times a day (BID) | INTRAVENOUS | Status: DC
  Administered 2020-05-25 – 2020-05-29 (×6): 1000 mg via INTRAVENOUS
  Filled 2020-05-25 (×8): qty 200

## 2020-05-25 MED ORDER — CHLORHEXIDINE GLUCONATE 0.12 % MT SOLN
OROMUCOSAL | Status: AC
  Administered 2020-05-25: 15 mL
  Filled 2020-05-25: qty 15

## 2020-05-25 MED ORDER — DIPHENHYDRAMINE HCL 50 MG/ML IJ SOLN: 25.0000 mg | Freq: Four times a day (QID) | INTRAMUSCULAR | Status: DC | PRN

## 2020-05-25 MED ORDER — OXYCODONE HCL 5 MG PO TABS: 5.0000 mg | ORAL_TABLET | Freq: Once | ORAL | Status: DC | PRN

## 2020-05-25 MED ORDER — OXYCODONE HCL 5 MG PO TABS: 5.0000 mg | ORAL_TABLET | ORAL | Status: DC | PRN

## 2020-05-25 MED ORDER — FENTANYL CITRATE (PF) 250 MCG/5ML IJ SOLN
INTRAMUSCULAR | Status: DC | PRN
  Administered 2020-05-25 (×2): 100 ug via INTRAVENOUS
  Administered 2020-05-25: 50 ug via INTRAVENOUS

## 2020-05-25 SURGICAL SUPPLY — 34 items
BNDG GAUZE ELAST 4 BULKY (GAUZE/BANDAGES/DRESSINGS) ×6 IMPLANT
CANISTER SUCT 3000ML PPV (MISCELLANEOUS) ×3 IMPLANT
COVER MAYO STAND STRL (DRAPES) ×3 IMPLANT
COVER SURGICAL LIGHT HANDLE (MISCELLANEOUS) ×3 IMPLANT
COVER WAND RF STERILE (DRAPES) ×3 IMPLANT
DRAIN PENROSE 1/2X12 LTX STRL (WOUND CARE) ×3 IMPLANT
DRAPE LAPAROSCOPIC ABDOMINAL (DRAPES) IMPLANT
DRAPE LAPAROTOMY 100X72 PEDS (DRAPES) IMPLANT
DRSG PAD ABDOMINAL 8X10 ST (GAUZE/BANDAGES/DRESSINGS) IMPLANT
ELECT REM PT RETURN 9FT ADLT (ELECTROSURGICAL) ×3
ELECTRODE REM PT RTRN 9FT ADLT (ELECTROSURGICAL) ×1 IMPLANT
GAUZE SPONGE 4X4 12PLY STRL (GAUZE/BANDAGES/DRESSINGS) IMPLANT
GLOVE BIO SURGEON STRL SZ 6.5 (GLOVE) ×2 IMPLANT
GLOVE BIO SURGEONS STRL SZ 6.5 (GLOVE) ×1
GLOVE BIOGEL PI IND STRL 6 (GLOVE) ×1 IMPLANT
GLOVE BIOGEL PI INDICATOR 6 (GLOVE) ×2
GOWN STRL REUS W/ TWL LRG LVL3 (GOWN DISPOSABLE) ×2 IMPLANT
GOWN STRL REUS W/TWL LRG LVL3 (GOWN DISPOSABLE) ×6
HANDPIECE INTERPULSE COAX TIP (DISPOSABLE) ×3
KIT BASIN OR (CUSTOM PROCEDURE TRAY) ×3 IMPLANT
KIT TURNOVER KIT B (KITS) ×3 IMPLANT
LEGGING LITHOTOMY PAIR STRL (DRAPES) ×3 IMPLANT
NS IRRIG 1000ML POUR BTL (IV SOLUTION) ×3 IMPLANT
PACK GENERAL/GYN (CUSTOM PROCEDURE TRAY) ×3 IMPLANT
PAD ARMBOARD 7.5X6 YLW CONV (MISCELLANEOUS) ×3 IMPLANT
PENCIL SMOKE EVACUATOR (MISCELLANEOUS) ×3 IMPLANT
SET HNDPC FAN SPRY TIP SCT (DISPOSABLE) ×1 IMPLANT
SUT SILK 2 0 PERMA HAND 18 BK (SUTURE) ×3 IMPLANT
SUT SILK 3 0 SH CR/8 (SUTURE) ×3 IMPLANT
SWAB COLLECTION DEVICE MRSA (MISCELLANEOUS) ×3 IMPLANT
SWAB CULTURE ESWAB REG 1ML (MISCELLANEOUS) ×3 IMPLANT
TOWEL GREEN STERILE (TOWEL DISPOSABLE) ×3 IMPLANT
TOWEL GREEN STERILE FF (TOWEL DISPOSABLE) ×3 IMPLANT
TRAY FOLEY MTR SLVR 16FR STAT (SET/KITS/TRAYS/PACK) ×3 IMPLANT

## 2020-05-25 NOTE — Op Note (Signed)
   Operative Note   Date: 05/25/2020  Procedure: incision and debridement of right groin and scrotum  Pre-op diagnosis: necrotizing soft tissue infection Post-op diagnosis: same  Indication and clinical history: The patient is a 54 y.o. 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     Surgeon: Diamantina Monks, MD Assistant: Andrey Campanile, MD  Anesthesiologist: Chaney Malling, MD Anesthesia: General  Findings:  . Specimen: aerobic and anaerobic cultures, right groin tissue . EBL: 15cc . Drains/Implants: none  Disposition: PACU - hemodynamically stable.  Description of procedure: The patient was positioned supine on the operating room table. General anesthetic induction and intubation were uneventful.  Time-out was performed verifying correct patient, procedure, laterality, signature of informed consent, and administration of pre-operative antibiotics.   The procedure began with obtaining aerobic and anaerobic cultures from the freely draining site at the base of the scrotum. An incision was then made at this site, but was unable to be tracked anteriorly. An incision was made over the right mons and deepened until necrotic tissue was encountered. This incision was extended inferiorly until the site of drainage was reached. Debridement of this tissue was performed using a combination of sharp dissection, electrocautery, and pulse lavage, with an ultimate wound size of approximately 20cm x 11cm x 5cm. The wound extended medially to the tunica vaginalis and this was closed with two vicryl sutures. There was no visualized necrotic tissue deeper than this layer. Laterally, the base of the wound extends near the fascia overlying the femoral vessels. The wound was dressed with saline kerlix and dressed. Foley catheter insertion was performed under sterile conditions at the conclusion of the procedure and was atraumatic.  Sterile dressings were applied. All sponge and  instrument counts were correct at the conclusion of the procedure. The patient was awakened from anesthesia, extubated uneventfully, and transported to the PACU in good condition. There were no complications.   Attempt made to reach significant other, Bridgett White, via phone. Unable to reach, left VM.    Diamantina Monks, MD General and Trauma Surgery [facility]

## 2020-05-25 NOTE — H&P (Signed)
[facility] Admission Note  Adam Haney 12/23/1965  [id number].    Requesting MD: Blane Ohara Chief Complaint/Reason for Consult: soft tissue infection  HPI:  Adam Haney is a 54yo male PMH CAD with inferior STEMI s/p cardiac stent x4 in 2014 on plavix (last dose this AM), HLD, and obesity who presented to [facility] this AM with worsening chills, body aches, and right groin pain. States that last Wednesday he developed chills and headache. He went to urgent care and had viral/covid testing which was negative. States that this morning around 0100 he awoke with severe pain in his right groin and noticed a rash as well as swelling extending into his right scrotum. He reports drainage from this area. No issues with urination, and states that he has never had anything like this before.  In the ED he underwent CT scan which showed soft tissue infection in the right groin region with edema and soft tissue gas, no circumscribed abscess collection, edema and gas extend into the proximal right scrotal region. WBC 23.3, lactic acid 1.8.  General surgery asked to see.  He has had nothing to eat/drink today.  Former smoker, quit in 2014 Employment: Retail banker at [employer]  Review of Systems  Constitutional: Positive for chills and malaise/fatigue. Negative for fever.  Eyes: Negative.   Respiratory: Negative.   Cardiovascular: Negative.   Gastrointestinal: Negative for abdominal pain, nausea and vomiting.  Genitourinary: Negative.   Skin:       Right groin pain/drainage  Neurological: Positive for headaches.   All systems reviewed and otherwise negative except for as above  Family History  Problem Relation Age of Onset  . CAD Mother     Past Medical History:  Diagnosis Date  . Coronary artery disease    a. 10/2012 Inf STEMI/Cath/PCI: LM 20p, LAD 80-90p, 16m/d, D1 sm 99d, LCX mild plaque, RCA 100d (2.75x16 Promus Premier DES into PDA, 3.25x20 Promus Prem  DES dRCA, 3.5x12 Promus Prem DES mRCA), EF 55-60%;  b. 10/2012 Staged PCI of LAD: 3.5x12 Promus Prem DES.  [name] Hyperlipidemia   . Leg pain    ABIs 3/16: Normal  . Nephrolithiasis    "passed on it's own" (2012-12-12)  . NSVT (nonsustained ventricular tachycardia) (HCC)    a. in setting of MI 10/2012 - Echo: EF 55%, mild LVH.  . Osgood-Schlatter's disease [id number]   "right knee" (12-12-2012)    Past Surgical History:  Procedure Laterality Date  . BACK SURGERY  1995  . LEFT HEART CATHETERIZATION WITH CORONARY ANGIOGRAM N/A 11/19/2012   Procedure: LEFT HEART CATHETERIZATION WITH CORONARY ANGIOGRAM;  Surgeon: Kathleene Hazel, MD;  Location: [facility] CATH LAB;  Service: Cardiovascular;  Laterality: N/A;  . LUMBAR DISC SURGERY  1995   "L5-S1 ruptured disc" (12-12-12)  . PERCUTANEOUS CORONARY STENT INTERVENTION (PCI-S) N/A 12/12/12   Procedure: PERCUTANEOUS CORONARY STENT INTERVENTION (PCI-S);  Surgeon: Kathleene Hazel, MD;  Location: [facility] CATH LAB;  Service: Cardiovascular;  Laterality: N/A;  . ULNAR COLLATERAL LIGAMENT REPAIR Left 2012    Social History:  reports that he has quit smoking. His smoking use included cigarettes. He has a 9.00 pack-year smoking history. He has never used smokeless tobacco. He reports that he does not drink alcohol and does not use drugs.  Allergies: No Known Allergies  (Not in a hospital admission)   Prior to Admission medications   Medication Sig Start Date End Date Taking? Authorizing Provider  ibuprofen (ADVIL) 800 MG tablet Take 1 tablet (800  mg total) by mouth 3 (three) times daily. 05/20/20  Yes Avegno, Zachery DakinsKomlanvi S, FNP  metoprolol tartrate (LOPRESSOR) 25 MG tablet Take 1 tablet (25 mg total) by mouth 2 (two) times daily. 09/16/19  Yes Kathleene HazelMcAlhany, Christopher D, MD  benzonatate (TESSALON) 100 MG capsule Take 1 capsule (100 mg total) by mouth every 8 (eight) hours. 05/20/20   Avegno, Zachery DakinsKomlanvi S, FNP  clopidogrel (PLAVIX) 75 MG tablet TAKE 1 TABLET DAILY.  PLEASE KEEP UPCOMING APPT IN JANUARY BEFORE ANYMORE REFILLS. FINAL ATTEMPT 09/16/19   Kathleene HazelMcAlhany, Christopher D, MD  dexamethasone (DECADRON) 4 MG tablet Take 1 tablet (4 mg total) by mouth daily for 7 days. 05/20/20 05/27/20  Avegno, Zachery DakinsKomlanvi S, FNP  ezetimibe (ZETIA) 10 MG tablet Take 1 tablet (10 mg total) by mouth daily. 12/18/19   Kathleene HazelMcAlhany, Christopher D, MD  fluticasone (FLONASE) 50 MCG/ACT nasal spray Place 1 spray into both nostrils daily for 14 days. 05/20/20 06/03/20  Avegno, Zachery DakinsKomlanvi S, FNP  nitroGLYCERIN (NITROSTAT) 0.4 MG SL tablet Place 1 tablet (0.4 mg total) under the tongue every 5 (five) minutes as needed for chest pain. 06/12/19   Kathleene HazelMcAlhany, Christopher D, MD  rosuvastatin (CRESTOR) 40 MG tablet Take 1 tablet (40 mg total) by mouth daily. 09/16/19   Kathleene HazelMcAlhany, Christopher D, MD    Blood pressure (!) 135/91, pulse 82, temperature 98.8 F (37.1 C), temperature source Oral, resp. rate 17, height 6\' 2"  (1.88 m), SpO2 100 %. Physical Exam: General: pleasant, WD/WN white male who is laying in bed in NAD HEENT: head is normocephalic, atraumatic.  Sclera are noninjected.  PERRL.  Ears and nose without any masses or lesions.  Mouth is pink and moist. Dentition fair Heart: regular, rate, and rhythm.  Normal s1,s2. No obvious murmurs, gallops, or rubs noted.  Palpable pedal pulses bilaterally  Lungs: CTAB, no wheezes, rhonchi, or rales noted.  Respiratory effort nonlabored Abd: soft, NT/ND, +BS, no masses, hernias, or organomegaly MS: no BUE/BLE edema, calves soft and nontender Psych: A&Ox4 with an appropriate affect Neuro: cranial nerves grossly intact, equal strength in BUE/BLE bilaterally, normal speech, thought process intact Skin: warm and dry. Extensive cellulitis extending from right lateral hip across to anterior left groin and down in to the right hemiscrotum with areas of ulceration and purulent drainage  Results for orders placed or performed during the hospital encounter of 05/25/20  (from the past 48 hour(s))  Comprehensive metabolic panel     Status: Abnormal   Collection Time: 05/25/20  3:57 AM  Result Value Ref Range   Sodium 132 (L) 135 - 145 mmol/L   Potassium 3.5 3.5 - 5.1 mmol/L   Chloride 98 98 - 111 mmol/L   CO2 20 (L) 22 - 32 mmol/L   Glucose, Bld 219 (H) 70 - 99 mg/dL    Comment: Glucose reference range applies only to samples taken after fasting for at least 8 hours.   BUN 19 6 - 20 mg/dL   Creatinine, Ser 4.090.85 0.61 - 1.24 mg/dL   Calcium 8.6 (L) 8.9 - 10.3 mg/dL   Total Protein 7.0 6.5 - 8.1 g/dL   Albumin 2.5 (L) 3.5 - 5.0 g/dL   AST 811200 (H) 15 - 41 U/L   ALT 190 (H) 0 - 44 U/L   Alkaline Phosphatase 135 (H) 38 - 126 U/L   Total Bilirubin 1.0 0.3 - 1.2 mg/dL   GFR, Estimated >91>60 >47>60 mL/min    Comment: (NOTE) Calculated using the CKD-EPI Creatinine Equation (2021)    Anion  gap 14 5 - 15    Comment: Performed at [facility], [address] [place], [place] [address]  CBC     Status: Abnormal   Collection Time: 05/25/20  3:57 AM  Result Value Ref Range   WBC 23.3 (H) 4.0 - 10.5 K/uL   RBC 4.57 4.22 - 5.81 MIL/uL   Hemoglobin 13.9 13.0 - 17.0 g/dL   HCT 10.2 39 - 52 %   MCV 89.9 80.0 - 100.0 fL   MCH 30.4 26.0 - 34.0 pg   MCHC 33.8 30.0 - 36.0 g/dL   RDW 58.5 27.7 - 82.4 %   Platelets 270 150 - 400 K/uL   nRBC 0.0 0.0 - 0.2 %    Comment: Performed at [facility], [address], [place], [place] [address]  Lactic acid, plasma     Status: None   Collection Time: 05/25/20  3:57 AM  Result Value Ref Range   Lactic Acid, Venous 1.8 0.5 - 1.9 mmol/L    Comment: Performed at [facility], [address] [place], [place] [address]  Lactic acid, plasma     Status: None   Collection Time: 05/25/20  9:33 AM  Result Value Ref Range   Lactic Acid, Venous 1.6 0.5 - 1.9 mmol/L    Comment: Performed at [facility], [address] [place], [place] [address]  Respiratory Panel by RT PCR (Flu A&B, Covid) - Nasopharyngeal  Swab     Status: None   Collection Time: 05/25/20 10:06 AM   Specimen: Nasopharyngeal Swab  Result Value Ref Range   SARS Coronavirus 2 by RT PCR NEGATIVE NEGATIVE    Comment: (NOTE) SARS-CoV-2 target nucleic acids are NOT DETECTED.  The SARS-CoV-2 RNA is generally detectable in upper respiratoy specimens during the acute phase of infection. The lowest concentration of SARS-CoV-2 viral copies this assay can detect is 131 copies/mL. A negative result does not preclude SARS-Cov-2 infection and should not be used as the sole basis for treatment or other patient management decisions. A negative result may occur with  improper specimen collection/handling, submission of specimen other than nasopharyngeal swab, presence of viral mutation(s) within the areas targeted by this assay, and inadequate number of viral copies (<131 copies/mL). A negative result must be combined with clinical observations, patient history, and epidemiological information. The expected result is Negative.  Fact Sheet for Patients:  https://www.moore.com/  Fact Sheet for Healthcare Providers:  https://www.young.biz/  This test is no t yet approved or cleared by the [place] FDA and  has been authorized for detection and/or diagnosis of SARS-CoV-2 by FDA under an Emergency Use Authorization (EUA). This EUA will remain  in effect (meaning this test can be used) for the duration of the COVID-19 declaration under Section 564(b)(1) of the Act, 21 U.S.C. section 360bbb-3(b)(1), unless the authorization is terminated or revoked sooner.     Influenza A by PCR NEGATIVE NEGATIVE   Influenza B by PCR NEGATIVE NEGATIVE    Comment: (NOTE) The Xpert Xpress SARS-CoV-2/FLU/RSV assay is intended as an aid in  the diagnosis of influenza from Nasopharyngeal swab specimens and  should not be used as a sole basis for treatment. Nasal washings and  aspirates are unacceptable for Xpert  Xpress SARS-CoV-2/FLU/RSV  testing.  Fact Sheet for Patients: https://www.moore.com/  Fact Sheet for Healthcare Providers: https://www.young.biz/  This test is not yet approved or cleared by the [place] FDA and  has been authorized for detection and/or  diagnosis of SARS-CoV-2 by  FDA under an Emergency Use Authorization (EUA). This EUA will remain  in effect (meaning this test can be used) for the duration of the  Covid-19 declaration under Section 564(b)(1) of the Act, 21  U.S.C. section 360bbb-3(b)(1), unless the authorization is  terminated or revoked. Performed at [facility], [address] [place], [place] [address]    CT ABDOMEN PELVIS W CONTRAST  Result Date: 05/25/2020 CLINICAL DATA:  Fever. Right pelvic cellulitis and abscess. EXAM: CT ABDOMEN AND PELVIS WITH CONTRAST TECHNIQUE: Multidetector CT imaging of the abdomen and pelvis was performed using the standard protocol following bolus administration of intravenous contrast. CONTRAST:  OMNIPAQUE IOHEXOL 300 MG/ML  SOLN COMPARISON:  01/03/2016 FINDINGS: Lower chest: Mild atelectasis and or scarring at the right lung base. Coronary artery calcification and/or stents. Hepatobiliary: Normal Pancreas: Normal Spleen: Normal Adrenals/Urinary Tract: Adrenal glands are normal. Kidneys are normal. No cyst, mass, stone or hydronephrosis. Bladder is normal. Stomach/Bowel: Normal. Vascular/Lymphatic: Aortic atherosclerosis. No aneurysm. IVC is normal. No retroperitoneal adenopathy. Reproductive: Normal Other: Soft tissue infection in the right groin region with edema and soft tissue gas. No circumscribed abscess collection. Edema and gas extend into the proximal right scrotal region. No intraperitoneal or retroperitoneal extension. Musculoskeletal: Ordinary lower lumbar degenerative changes. IMPRESSION: 1. Soft tissue infection in the right groin region with edema and soft tissue gas. No  circumscribed abscess collection. Edema and gas extend into the proximal right scrotal region. No intraperitoneal or retroperitoneal extension. 2. Aortic atherosclerosis. Coronary artery calcification and/or stents. Aortic Atherosclerosis (ICD10-I70.0). Electronically Signed   By: Paulina Fusi M.D.   On: 05/25/2020 11:40      Assessment/Plan CAD with inferior STEMI s/p cardiac stent x4 in 2014 on plavix (last dose this AM) HLD Obesity Hyperglycemia - glucose 219, check A1c Elevated LFTs  Necrotizing soft tissue infection right groin - To OR for incision and debridement of right groin soft tissue infection. Zosyn, vancomycin, and clindamycin have been ordered. He is NPO. Covid test is negative.  Franne Forts, PA-C [facility] 05/25/2020, 12:48 PM Please see Amion for pager number during day hours 7:00am-4:30pm

## 2020-05-25 NOTE — ED Triage Notes (Signed)
The pt had chills and body aches this past Wednesday he was seen at [facility] urgent care tonight  At 0100  He woke up with rt groin swelling  Severe pain and a drainage that smelled bad he did not look to see where it was coming from.

## 2020-05-25 NOTE — ED Provider Notes (Signed)
Adam Haney Provider Note   CSN: [id number] Arrival date & time: 05/25/20  0142     History Chief Complaint  Patient presents with  . Groin Pain    Adam Haney is a 54 y.o. male.  Patient with history of coronary artery disease, hyperlipidemia presents with worsening chills, body aches and now rash in the right groin.  Patient's had chills and feeling generally unwell since Wednesday when he was seen at the urgent care and had viral/Covid testing done which was negative per his report.  Patient continued to have chills and not feeling well and early this morning at 1:00 woke up with rash and swelling to the right groin extending to the testicle.  Patient has no history of similar, no history of MRSA, no hernia history or diabetes history.  No IV drug use history.  Pain is significant at this time.        Past Medical History:  Diagnosis Date  . Coronary artery disease    a. 10/2012 Inf STEMI/Cath/PCI: LM 20p, LAD 80-90p, 37m/d, D1 sm 99d, LCX mild plaque, RCA 100d (2.75x16 Promus Premier DES into PDA, 3.25x20 Promus Prem DES dRCA, 3.5x12 Promus Prem DES mRCA), EF 55-60%;  b. 10/2012 Staged PCI of LAD: 3.5x12 Promus Prem DES.  [name] Hyperlipidemia   . Leg pain    ABIs 3/16: Normal  . Nephrolithiasis    "passed on it's own" (11/25/2012)  . NSVT (nonsustained ventricular tachycardia) (HCC)    a. in setting of MI 10/2012 - Echo: EF 55%, mild LVH.  . Osgood-Schlatter's disease [phone]   "right knee" (Nov 25, 2012)    Patient Active Problem List   Diagnosis Date Noted  . Necrotizing soft tissue infection 05/25/2020  . Hyperglycemia 05/25/2020  . Class 1 obesity due to excess calories with body mass index (BMI) of 32.0 to 32.9 in adult 05/25/2020  . NSVT (nonsustained ventricular tachycardia) (HCC)   . Hyperlipidemia   . ST elevation myocardial infarction (STEMI) of inferior wall (HCC) 11/19/2012  . Tobacco abuse 11/19/2012  . Coronary  atherosclerosis of native coronary artery 11/19/2012    Past Surgical History:  Procedure Laterality Date  . BACK SURGERY  1995  . IRRIGATION AND DEBRIDEMENT ABSCESS Right 05/25/2020   Procedure: IRRIGATION AND DEBRIDEMENT ABSCESS GROIN;  Surgeon: Diamantina Monks, MD;  Location: [facility] OR;  Service: General;  Laterality: Right;  . LEFT HEART CATHETERIZATION WITH CORONARY ANGIOGRAM N/A 11/19/2012   Procedure: LEFT HEART CATHETERIZATION WITH CORONARY ANGIOGRAM;  Surgeon: Kathleene Hazel, MD;  Location: [facility] CATH LAB;  Service: Cardiovascular;  Laterality: N/A;  . LUMBAR DISC SURGERY  1995   "L5-S1 ruptured disc" (Nov 25, 2012)  . PERCUTANEOUS CORONARY STENT INTERVENTION (PCI-S) N/A 2012/11/25   Procedure: PERCUTANEOUS CORONARY STENT INTERVENTION (PCI-S);  Surgeon: Kathleene Hazel, MD;  Location: [facility] CATH LAB;  Service: Cardiovascular;  Laterality: N/A;  . ULNAR COLLATERAL LIGAMENT REPAIR Left 2012       Family History  Problem Relation Age of Onset  . CAD Mother     Social History   Tobacco Use  . Smoking status: Former Smoker    Packs/day: 0.50    Years: 18.00    Pack years: 9.00    Types: Cigarettes  . Smokeless tobacco: Never Used  . Tobacco comment: 25-Nov-2012 offered smoking sessation materials; pt declines  Vaping Use  . Vaping Use: Never used  Substance Use Topics  . Alcohol use: No  . Drug use: No  Home Medications Prior to Admission medications   Medication Sig Start Date End Date Taking? Authorizing Provider  clopidogrel (PLAVIX) 75 MG tablet TAKE 1 TABLET DAILY. PLEASE KEEP UPCOMING APPT IN JANUARY BEFORE ANYMORE REFILLS. FINAL ATTEMPT Patient taking differently: Take 75 mg by mouth daily.  09/16/19  Yes Kathleene Hazel, MD  ezetimibe (ZETIA) 10 MG tablet Take 1 tablet (10 mg total) by mouth daily. 12/18/19  Yes Kathleene Hazel, MD  ibuprofen (ADVIL) 800 MG tablet Take 800 mg by mouth every 6 (six) hours as needed for headache.   Yes [provider]  metoprolol tartrate (LOPRESSOR) 25 MG tablet Take 1 tablet (25 mg total) by mouth 2 (two) times daily. 09/16/19  Yes Kathleene Hazel, MD  nitroGLYCERIN (NITROSTAT) 0.4 MG SL tablet Place 1 tablet (0.4 mg total) under the tongue every 5 (five) minutes as needed for chest pain. 06/12/19  Yes Kathleene Hazel, MD  rosuvastatin (CRESTOR) 40 MG tablet Take 1 tablet (40 mg total) by mouth daily. 09/16/19  Yes Kathleene Hazel, MD    Allergies    Patient has no known allergies.  Review of Systems   Review of Systems  Constitutional: Positive for chills and fever.  HENT: Negative for congestion.   Eyes: Negative for visual disturbance.  Respiratory: Negative for shortness of breath.   Cardiovascular: Negative for chest pain.  Gastrointestinal: Positive for abdominal pain. Negative for vomiting.  Genitourinary: Negative for dysuria and flank pain.  Musculoskeletal: Negative for back pain, neck pain and neck stiffness.  Skin: Positive for rash.  Neurological: Negative for light-headedness and headaches.    Physical Exam Updated Vital Signs BP (!) 103/53 (BP Location: Left Arm) Comment: not. nurse  Pulse 93   Temp 97.9 F (36.6 C) (Oral)   Resp 13   Ht 6\' 2"  (1.88 m)   Wt 114.3 kg   SpO2 98%   BMI 32.35 kg/m   Physical Exam Vitals and nursing note reviewed.  Constitutional:      Appearance: He is well-developed.  HENT:     Head: Normocephalic and atraumatic.  Eyes:     General:        Right eye: No discharge.        Left eye: No discharge.     Conjunctiva/sclera: Conjunctivae normal.  Neck:     Trachea: No tracheal deviation.  Cardiovascular:     Rate and Rhythm: Normal rate and regular rhythm.  Pulmonary:     Effort: Pulmonary effort is normal.     Breath sounds: Normal breath sounds.  Abdominal:     General: There is no distension.     Palpations: Abdomen is soft.     Tenderness: There is abdominal tenderness. There is no guarding.   Musculoskeletal:     Cervical back: Normal range of motion and neck supple.  Skin:    General: Skin is warm.     Capillary Refill: Capillary refill takes less than 2 seconds.     Findings: Erythema and rash present.     Comments: Patient has significant erythema warmth in central induration extending from right lateral hip across to left lateral groin and testicle region.  No significant induration swelling right inguinal ligament.  Mild tender to palpation.  No focal testicular tenderness.  No crepitus.  Neurological:     Mental Status: He is alert and oriented to person, place, and time.  Psychiatric:        Mood and Affect: Mood normal.  ED Results / Procedures / Treatments   Labs (all labs ordered are listed, but only abnormal results are displayed) Labs Reviewed  COMPREHENSIVE METABOLIC PANEL - Abnormal; Notable for the following components:      Result Value   Sodium 132 (*)    CO2 20 (*)    Glucose, Bld 219 (*)    Calcium 8.6 (*)    Albumin 2.5 (*)    AST 200 (*)    ALT 190 (*)    Alkaline Phosphatase 135 (*)    All other components within normal limits  CBC - Abnormal; Notable for the following components:   WBC 23.3 (*)    All other components within normal limits  HEMOGLOBIN A1C - Abnormal; Notable for the following components:   Hgb A1c MFr Bld 6.2 (*)    All other components within normal limits  COMPREHENSIVE METABOLIC PANEL - Abnormal; Notable for the following components:   Sodium 134 (*)    Glucose, Bld 247 (*)    Calcium 7.7 (*)    Total Protein 5.9 (*)    Albumin 2.0 (*)    AST 72 (*)    ALT 151 (*)    All other components within normal limits  CBC - Abnormal; Notable for the following components:   WBC 20.7 (*)    RBC 4.19 (*)    Hemoglobin 12.4 (*)    HCT 37.7 (*)    All other components within normal limits  CULTURE, BLOOD (ROUTINE X 2)  CULTURE, BLOOD (ROUTINE X 2)  RESPIRATORY PANEL BY RT PCR (FLU A&B, COVID)  AEROBIC/ANAEROBIC CULTURE  (SURGICAL/DEEP WOUND)  LACTIC ACID, PLASMA  LACTIC ACID, PLASMA  HIV ANTIBODY (ROUTINE TESTING W REFLEX)  URINALYSIS, ROUTINE W REFLEX MICROSCOPIC  HEMOGLOBIN A1C  SURGICAL PATHOLOGY    EKG None  Radiology DG Abd 1 View  Result Date: 05/25/2020 CLINICAL DATA:  Surgery in the groin region. Concern regarding lost needle. EXAM: ABDOMEN - 1 VIEW COMPARISON:  CT earlier same day FINDINGS: No radiopaque foreign object in the region imaged. IMPRESSION: No radiopaque foreign object. Report called to operating room by myself. There was no answer to the ringing phone. Therefore, PRA call report process instituted. Electronically Signed   By: Paulina Fusi M.D.   On: 05/25/2020 15:57   CT ABDOMEN PELVIS W CONTRAST  Result Date: 05/25/2020 CLINICAL DATA:  Fever. Right pelvic cellulitis and abscess. EXAM: CT ABDOMEN AND PELVIS WITH CONTRAST TECHNIQUE: Multidetector CT imaging of the abdomen and pelvis was performed using the standard protocol following bolus administration of intravenous contrast. CONTRAST:  OMNIPAQUE IOHEXOL 300 MG/ML  SOLN COMPARISON:  01/03/2016 FINDINGS: Lower chest: Mild atelectasis and or scarring at the right lung base. Coronary artery calcification and/or stents. Hepatobiliary: Normal Pancreas: Normal Spleen: Normal Adrenals/Urinary Tract: Adrenal glands are normal. Kidneys are normal. No cyst, mass, stone or hydronephrosis. Bladder is normal. Stomach/Bowel: Normal. Vascular/Lymphatic: Aortic atherosclerosis. No aneurysm. IVC is normal. No retroperitoneal adenopathy. Reproductive: Normal Other: Soft tissue infection in the right groin region with edema and soft tissue gas. No circumscribed abscess collection. Edema and gas extend into the proximal right scrotal region. No intraperitoneal or retroperitoneal extension. Musculoskeletal: Ordinary lower lumbar degenerative changes. IMPRESSION: 1. Soft tissue infection in the right groin region with edema and soft tissue gas. No  circumscribed abscess collection. Edema and gas extend into the proximal right scrotal region. No intraperitoneal or retroperitoneal extension. 2. Aortic atherosclerosis. Coronary artery calcification and/or stents. Aortic Atherosclerosis (ICD10-I70.0). Electronically Signed  By: Paulina Fusi M.D.   On: 05/25/2020 11:40    Procedures .Critical Care Performed by: Blane Ohara, MD Authorized by: Blane Ohara, MD   Critical care provider statement:    Critical care time (minutes):  40   Critical care start time:  05/25/2020 9:15 AM   Critical care end time:  05/25/2020 9:55 AM   Critical care time was exclusive of:  Separately billable procedures and treating other patients and teaching time   Critical care was necessary to treat or prevent imminent or life-threatening deterioration of the following conditions:  Sepsis   Critical care was time spent personally by me on the following activities:  Discussions with consultants, evaluation of patient's response to treatment, examination of patient, ordering and performing treatments and interventions, ordering and review of laboratory studies, ordering and review of radiographic studies, pulse oximetry, re-evaluation of patient's condition, obtaining history from patient or surrogate and review of old charts   (including critical care time)  Medications Ordered in ED Medications  0.9 %  sodium chloride infusion ( Intravenous New Bag/Given 05/26/20 0440)  enoxaparin (LOVENOX) injection 40 mg (40 mg Subcutaneous Given 05/26/20 0816)  metoprolol tartrate (LOPRESSOR) injection 5 mg (has no administration in time range)  simethicone (MYLICON) chewable tablet 40 mg (has no administration in time range)  ondansetron (ZOFRAN-ODT) disintegrating tablet 4 mg (has no administration in time range)    Or  ondansetron (ZOFRAN) injection 4 mg (has no administration in time range)  polyethylene glycol (MIRALAX / GLYCOLAX) packet 17 g (has no administration in  time range)  diphenhydrAMINE (BENADRYL) capsule 25 mg (has no administration in time range)    Or  diphenhydrAMINE (BENADRYL) injection 25 mg (has no administration in time range)  acetaminophen (TYLENOL) tablet 650 mg (650 mg Oral Given 05/25/20 1854)    Or  acetaminophen (TYLENOL) suppository 650 mg ( Rectal See Alternative 05/25/20 1854)  metoprolol tartrate (LOPRESSOR) 12.5 mg tablet (  Not Given 05/25/20 1839)  metoprolol tartrate (LOPRESSOR) tablet 25 mg (25 mg Oral Given 05/26/20 0940)  clindamycin (CLEOCIN) IVPB 600 mg (600 mg Intravenous New Bag/Given 05/26/20 0815)  piperacillin-tazobactam (ZOSYN) IVPB 3.375 g (3.375 g Intravenous New Bag/Given 05/26/20 0446)  naloxone (NARCAN) injection 0.4 mg (has no administration in time range)    And  sodium chloride flush (NS) 0.9 % injection 9 mL (has no administration in time range)  morphine 2 mg/mL PCA injection ( Intravenous Received 05/26/20 0946)  vancomycin (VANCOCIN) IVPB 1000 mg/200 mL premix (1,000 mg Intravenous New Bag/Given 05/26/20 0946)  ezetimibe (ZETIA) tablet 10 mg (10 mg Oral Given 05/26/20 0940)  rosuvastatin (CRESTOR) tablet 40 mg (40 mg Oral Given 05/26/20 0942)  Chlorhexidine Gluconate Cloth 2 % PADS 6 each (has no administration in time range)  insulin aspart (novoLOG) injection 0-5 Units (has no administration in time range)  insulin aspart (novoLOG) injection 0-15 Units (has no administration in time range)  lactated ringers bolus 1,000 mL (0 mLs Intravenous Stopped 05/25/20 1220)  morphine 4 MG/ML injection 4 mg (4 mg Intravenous Given 05/25/20 0948)  vancomycin (VANCOREADY) IVPB 1750 mg/350 mL (0 mg Intravenous Stopped 05/25/20 1841)  piperacillin-tazobactam (ZOSYN) IVPB 3.375 g (0 g Intravenous Stopped 05/25/20 1022)  iohexol (OMNIPAQUE) 300 MG/ML solution 100 mL (100 mLs Intravenous Contrast Given 05/25/20 1132)  clindamycin (CLEOCIN) IVPB 900 mg ( Intravenous MAR Unhold 05/25/20 1834)  metoprolol tartrate (LOPRESSOR) tablet  25 mg (25 mg Oral Given 05/25/20 1328)  chlorhexidine (PERIDEX) 0.12 % solution (15 mLs  Given 05/25/20 1335)    ED Course  I have reviewed the triage vital signs and the nursing notes.  Pertinent labs & imaging results that were available during my care of the patient were reviewed by me and considered in my medical decision making (see chart for details).    MDM Rules/Calculators/A&P                          Patient presents with clinical concern for significant cellulitis and abscess in the right pelvis, groin extending to the testicle.  With rapidity of onset since this morning and extensive spreading IV cultures, antibiotics and stat CT scan ordered for further delineation.  Patient will likely need surgical consult.  Blood work reviewed showing leukocytosis 23,000, hyponatremia 132.  IV fluid bolus ordered.  Initial lactate normal and blood pressure normal at this time.  Liver function also elevated on blood work reviewed 200/190 respectively.  Patient CT scan concerning for gas-forming organism, surgery consulted immediately, clindamycin was added.  Plan for emergent operating room and admission.  Final Clinical Impression(s) / ED Diagnoses Final diagnoses:  Cellulitis of groin, right  Fournier's gangrene in male    Rx / DC Orders ED Discharge Orders    None       Blane OharaZavitz, Orenthal Debski, MD 05/26/20 1119

## 2020-05-25 NOTE — Anesthesia Preprocedure Evaluation (Signed)
Anesthesia Evaluation  Patient identified by MRN, date of birth, ID band Patient awake    Reviewed: Allergy & Precautions, H&P , NPO status , Patient's Chart, lab work & pertinent test results  Airway Mallampati: II   Neck ROM: full    Dental   Pulmonary former smoker,    breath sounds clear to auscultation       Cardiovascular + CAD, + Past MI and + Cardiac Stents   Rhythm:regular Rate:Normal     Neuro/Psych    GI/Hepatic   Endo/Other    Renal/GU      Musculoskeletal   Abdominal   Peds  Hematology   Anesthesia Other Findings   Reproductive/Obstetrics                             Anesthesia Physical Anesthesia Plan  ASA: III  Anesthesia Plan: General   Post-op Pain Management:    Induction: Intravenous  PONV Risk Score and Plan: 2 and Ondansetron, Dexamethasone, Midazolam and Treatment may vary due to age or medical condition  Airway Management Planned: Oral ETT  Additional Equipment:   Intra-op Plan:   Post-operative Plan: Extubation in OR  Informed Consent: I have reviewed the patients History and Physical, chart, labs and discussed the procedure including the risks, benefits and alternatives for the proposed anesthesia with the patient or authorized representative who has indicated his/her understanding and acceptance.       Plan Discussed with: CRNA, Anesthesiologist and Surgeon  Anesthesia Plan Comments:         Anesthesia Quick Evaluation

## 2020-05-25 NOTE — Anesthesia Procedure Notes (Signed)
Procedure Name: Intubation Date/Time: 05/25/2020 2:00 PM Performed by: Marena Chancy, CRNA Pre-anesthesia Checklist: Patient identified, Emergency Drugs available, Suction available and Patient being monitored Patient Re-evaluated:Patient Re-evaluated prior to induction Oxygen Delivery Method: Circle System Utilized Preoxygenation: Pre-oxygenation with 100% oxygen Induction Type: IV induction Ventilation: Mask ventilation without difficulty Laryngoscope Size: Miller and 2 Grade View: Grade I Tube type: Oral Tube size: 7.5 mm Number of attempts: 1 Airway Equipment and Method: Stylet and Oral airway Placement Confirmation: ETT inserted through vocal cords under direct vision,  positive ETCO2 and breath sounds checked- equal and bilateral Tube secured with: Tape Dental Injury: Teeth and Oropharynx as per pre-operative assessment

## 2020-05-25 NOTE — ED Notes (Signed)
Pt given a couple of ice chips, per Dr. Jodi Mourning.

## 2020-05-25 NOTE — Consult Note (Signed)
Consult Note   Adam Haney IWL:[id number] DOB: 1965-09-19 DOA: 05/25/2020  PCP: [name], MD Consultants:  Clifton James - cardiology Patient coming from:  Home; NOK: Mother, Ernestina Patches, [phone]  Chief Complaint: Groin pain  HPI: Adam Haney is a 54 y.o. male with medical history significant of HLD and CAD with stents presenting with groin pain, chills, body aches. He presented to urgent care on 10/27 with c/p mild cough, headache, myalgias, and loss of taste/smell which started the night prior.  He was treated with Decadron, Tessalon, and Flonase; COVID, flu, and RSV tests were negative.  He woke up this AM at 0100 with severe R groin pain and malodorous drainage.  He had a rash extending from his groin to testicle.  It appeared to be rapidly spreading and so cultures and antibiotics were ordered.  CT showed soft tissue infection in the R groin region with edema and soft tissue gas extending into the R scrotal region.  He was seen in PACU and reports persistent but improved pain.     ED Course:  Fever, malaise - urgent care last week.  Abscess, cellulitis with rapid spread, gas throughout.  ?undiagnosed diabetic.  Review of Systems: As per HPI; otherwise review of systems reviewed and negative.   Ambulatory Status:  Ambulates without assistance  COVID Vaccine Status:  Unknown  Past Medical History:  Diagnosis Date  . Coronary artery disease    a. 10/2012 Inf STEMI/Cath/PCI: LM 20p, LAD 80-90p, 50m/d, D1 sm 99d, LCX mild plaque, RCA 100d (2.75x16 Promus Premier DES into PDA, 3.25x20 Promus Prem DES dRCA, 3.5x12 Promus Prem DES mRCA), EF 55-60%;  b. 10/2012 Staged PCI of LAD: 3.5x12 Promus Prem DES.  [name] Hyperlipidemia   . Leg pain    ABIs 3/16: Normal  . Nephrolithiasis    "passed on it's own" (2012/11/26)  . NSVT (nonsustained ventricular tachycardia) (HCC)    a. in setting of MI 10/2012 - Echo: EF 55%, mild LVH.  . Osgood-Schlatter's disease [phone]   "right knee"  (26-Nov-2012)    Past Surgical History:  Procedure Laterality Date  . BACK SURGERY  1995  . LEFT HEART CATHETERIZATION WITH CORONARY ANGIOGRAM N/A 11/19/2012   Procedure: LEFT HEART CATHETERIZATION WITH CORONARY ANGIOGRAM;  Surgeon: Kathleene Hazel, MD;  Location: [facility] CATH LAB;  Service: Cardiovascular;  Laterality: N/A;  . LUMBAR DISC SURGERY  1995   "L5-S1 ruptured disc" (11-26-2012)  . PERCUTANEOUS CORONARY STENT INTERVENTION (PCI-S) N/A 2012-11-26   Procedure: PERCUTANEOUS CORONARY STENT INTERVENTION (PCI-S);  Surgeon: Kathleene Hazel, MD;  Location: [facility] CATH LAB;  Service: Cardiovascular;  Laterality: N/A;  . ULNAR COLLATERAL LIGAMENT REPAIR Left 2012    Social History   Socioeconomic History  . Marital status: Single    Spouse name: Not on file  . Number of children: Not on file  . Years of education: Not on file  . Highest education level: Not on file  Occupational History  . Not on file  Tobacco Use  . Smoking status: Former Smoker    Packs/day: 0.50    Years: 18.00    Pack years: 9.00    Types: Cigarettes  . Smokeless tobacco: Never Used  . Tobacco comment: 11-26-12 offered smoking sessation materials; pt declines  Vaping Use  . Vaping Use: Never used  Substance and Sexual Activity  . Alcohol use: No  . Drug use: No  . Sexual activity: Yes  Other Topics Concern  . Not on file  Social History  Narrative  . Not on file   Social Determinants of Health   Financial Resource Strain:   . Difficulty of Paying Living Expenses: Not on file  Food Insecurity:   . Worried About Programme researcher, broadcasting/film/video in the Last Year: Not on file  . Ran Out of Food in the Last Year: Not on file  Transportation Needs:   . Lack of Transportation (Medical): Not on file  . Lack of Transportation (Non-Medical): Not on file  Physical Activity:   . Days of Exercise per Week: Not on file  . Minutes of Exercise per Session: Not on file  Stress:   . Feeling of Stress : Not on file   Social Connections:   . Frequency of Communication with Friends and Family: Not on file  . Frequency of Social Gatherings with Friends and Family: Not on file  . Attends Religious Services: Not on file  . Active Member of Clubs or Organizations: Not on file  . Attends Banker Meetings: Not on file  . Marital Status: Not on file  Intimate Partner Violence:   . Fear of Current or Ex-Partner: Not on file  . Emotionally Abused: Not on file  . Physically Abused: Not on file  . Sexually Abused: Not on file    No Known Allergies  Family History  Problem Relation Age of Onset  . CAD Mother     Prior to Admission medications   Medication Sig Start Date End Date Taking? Authorizing Provider  ibuprofen (ADVIL) 800 MG tablet Take 1 tablet (800 mg total) by mouth 3 (three) times daily. 05/20/20  Yes Avegno, Zachery Dakins, FNP  metoprolol tartrate (LOPRESSOR) 25 MG tablet Take 1 tablet (25 mg total) by mouth 2 (two) times daily. 09/16/19  Yes Kathleene Hazel, MD  benzonatate (TESSALON) 100 MG capsule Take 1 capsule (100 mg total) by mouth every 8 (eight) hours. 05/20/20   Avegno, Zachery Dakins, FNP  clopidogrel (PLAVIX) 75 MG tablet TAKE 1 TABLET DAILY. PLEASE KEEP UPCOMING APPT IN JANUARY BEFORE ANYMORE REFILLS. FINAL ATTEMPT 09/16/19   Kathleene Hazel, MD  dexamethasone (DECADRON) 4 MG tablet Take 1 tablet (4 mg total) by mouth daily for 7 days. 05/20/20 05/27/20  Avegno, Zachery Dakins, FNP  ezetimibe (ZETIA) 10 MG tablet Take 1 tablet (10 mg total) by mouth daily. 12/18/19   Kathleene Hazel, MD  fluticasone (FLONASE) 50 MCG/ACT nasal spray Place 1 spray into both nostrils daily for 14 days. 05/20/20 06/03/20  Avegno, Zachery Dakins, FNP  nitroGLYCERIN (NITROSTAT) 0.4 MG SL tablet Place 1 tablet (0.4 mg total) under the tongue every 5 (five) minutes as needed for chest pain. 06/12/19   Kathleene Hazel, MD  rosuvastatin (CRESTOR) 40 MG tablet Take 1 tablet (40 mg  total) by mouth daily. 09/16/19   Kathleene Hazel, MD    Physical Exam: Vitals:   05/25/20 1750 05/25/20 1805 05/25/20 1840 05/25/20 1917  BP: 106/68 104/68 110/72 107/64  Pulse: 79 77 72 79  Resp: 17 14 17 20   Temp: 98.7 F (37.1 C)  (!) 101.3 F (38.5 C) (!) 100.9 F (38.3 C)  TempSrc:   Oral Axillary  SpO2: 96% 96% 95% 95%  Weight:      Height:         . General:  Appears calm and comfortable and is NAD . Eyes:   EOMI, normal lids, iris . ENT:  grossly normal hearing, lips & tongue, mmm . Neck:  no LAD,  masses or thyromegal . Cardiovascular:  RRR, no m/r/g. No LE edema.  [name] Respiratory:   CTA bilaterally with no wheezes/rales/rhonchi.  Normal respiratory effort. . Abdomen:  soft, NT, ND, NABS; dressing in place along majority of groin/scrotum but R > L . Skin:  no rash or induration seen on limited exam . Musculoskeletal:  grossly normal tone BUE/BLE, good ROM, no bony abnormality . Lower extremity:  No LE edema.  Limited foot exam with no ulcerations.  2+ distal pulses. [name] Psychiatric:  grossly normal mood and affect, speech fluent and appropriate, AOx3 . Neurologic:  CN 2-12 grossly intact, moves all extremities in coordinated fashion    Radiological Exams on Admission: Independently reviewed - see discussion in A/P where applicable  DG Abd 1 View  Result Date: 05/25/2020 CLINICAL DATA:  Surgery in the groin region. Concern regarding lost needle. EXAM: ABDOMEN - 1 VIEW COMPARISON:  CT earlier same day FINDINGS: No radiopaque foreign object in the region imaged. IMPRESSION: No radiopaque foreign object. Report called to operating room by myself. There was no answer to the ringing phone. Therefore, PRA call report process instituted. Electronically Signed   By: Paulina Fusi M.D.   On: 05/25/2020 15:57   CT ABDOMEN PELVIS W CONTRAST  Result Date: 05/25/2020 CLINICAL DATA:  Fever. Right pelvic cellulitis and abscess. EXAM: CT ABDOMEN AND PELVIS WITH CONTRAST  TECHNIQUE: Multidetector CT imaging of the abdomen and pelvis was performed using the standard protocol following bolus administration of intravenous contrast. CONTRAST:  OMNIPAQUE IOHEXOL 300 MG/ML  SOLN COMPARISON:  01/03/2016 FINDINGS: Lower chest: Mild atelectasis and or scarring at the right lung base. Coronary artery calcification and/or stents. Hepatobiliary: Normal Pancreas: Normal Spleen: Normal Adrenals/Urinary Tract: Adrenal glands are normal. Kidneys are normal. No cyst, mass, stone or hydronephrosis. Bladder is normal. Stomach/Bowel: Normal. Vascular/Lymphatic: Aortic atherosclerosis. No aneurysm. IVC is normal. No retroperitoneal adenopathy. Reproductive: Normal Other: Soft tissue infection in the right groin region with edema and soft tissue gas. No circumscribed abscess collection. Edema and gas extend into the proximal right scrotal region. No intraperitoneal or retroperitoneal extension. Musculoskeletal: Ordinary lower lumbar degenerative changes. IMPRESSION: 1. Soft tissue infection in the right groin region with edema and soft tissue gas. No circumscribed abscess collection. Edema and gas extend into the proximal right scrotal region. No intraperitoneal or retroperitoneal extension. 2. Aortic atherosclerosis. Coronary artery calcification and/or stents. Aortic Atherosclerosis (ICD10-I70.0). Electronically Signed   By: Paulina Fusi M.D.   On: 05/25/2020 11:40    EKG: not done   Labs on Admission: I have personally reviewed the available labs and imaging studies at the time of the admission.  Pertinent labs:   Na++ 132 CO2 20 Glucose 219 AP 135 Albumin 2.5 AST 200/ALT 190; normal on 1/13 Lactate 1.8, 1.6 COVID/flu negative   Assessment/Plan Principal Problem:   Necrotizing soft tissue infection Active Problems:   Tobacco abuse   Coronary atherosclerosis of native coronary artery   Hyperlipidemia   Hyperglycemia   Class 1 obesity due to excess calories with body  mass index (BMI) of 32.0 to 32.9 in adult   Necrotizing soft tissue infection -Patient presenting with significant R groin infection -He was taken to the OR prior to my evaluation and I saw him in PACU, when he appeared calm and comfortable -s/p I&D of the R groin and scrotum -He may need further surgical intervention, possibly on Wednesday (11/3) -Management per surgery -He was seen on 10/27 and started on various medications for presumed COVID  infection; since his infection has now declared itself as other than COVID will d/c those medications (including Decadron)  CAD -Resume Plavix when ok with surgery -No c/o CP at this time  HLD -Continue Crestor, Zetia  Hyperglycemia -Denies h/o DM -Current A1c is 6.2 -Will add sensitive-scale SSI for improved wound healing but he should be appropriate for dietary modification trial at the time of d/c -May be artificially elevated from Decadron  Tobacco dependence -In remission  Obesity Body mass index is 32.35 kg/m.  -Weight loss should be encouraged -Outpatient PCP/bariatric medicine/bariatric surgery f/u encouraged     Note: This patient has been tested and is negative for the novel coronavirus COVID-19.    Thank you for this interesting consult.  [facility] will follow the patient with you at this time.    Jonah BlueJennifer Milford Cilento MD [facility]   How to contact the [facility] Attending or Consulting provider 7A - 7P or covering provider during after hours 7P -7A, for this patient?  1. Check the care team in [facility] and look for a) attending/consulting [facility] provider listed and b) the [facility] team listed 2. Log into [email or web address] and use [facility] universal password to access. If you do not have the password, please contact the hospital operator. 3. Locate the [facility] provider you are looking for under [facility] and page to a number that you can be directly reached. 4. If you still have difficulty reaching the provider, please page the [facility]  (Director on Call) for the Hospitalists listed on amion for assistance.   05/25/2020, 7:32 PM

## 2020-05-25 NOTE — Transfer of Care (Signed)
Immediate Anesthesia Transfer of Care Note  Patient: Adam Haney  Procedure(s) Performed: IRRIGATION AND DEBRIDEMENT ABSCESS GROIN (Right Groin)  Patient Location: PACU  Anesthesia Type:General  Level of Consciousness: awake, alert  and oriented  Airway & Oxygen Therapy: Patient Spontanous Breathing and Patient connected to nasal cannula oxygen  Post-op Assessment: Report given to RN, Post -op Vital signs reviewed and stable and Patient moving all extremities X 4  Post vital signs: Reviewed and stable  Last Vitals:  Vitals Value Taken Time  BP    Temp    Pulse    Resp    SpO2      Last Pain:  Vitals:   05/25/20 1320  TempSrc: Oral  PainSc:          Complications: No complications documented.

## 2020-05-25 NOTE — Progress Notes (Signed)
Pharmacy Antibiotic Note  Adam Haney is a 54 y.o. male admitted on 05/25/2020 with necrotizing soft tissue infection.  Pharmacy has been consulted for vancomycin dosing.  Underwent I&D of R groin/scrotum today after worsening chills, body aches, and R groin pain. CT today showing soft tissue infection in R groin with edema and gas. WBC 23. LA 1.8, Scr 0.85 (normCrCl >100 mL/min). Received zosyn, clindamycin and vancomycin 1750 mg x1 (LD 11/1@1102 ).   Plan: Vancomycin 1g IV every 12 hours Monitor renal fx, cx results, clinical pic, and VT as appropriate  Height: 6\' 2"  (188 cm) Weight: 114.3 kg (252 lb) IBW/kg (Calculated) : 82.2  Temp (24hrs), Avg:98.7 F (37.1 C), Min:97.8 F (36.6 C), Max:101.2 F (38.4 C)  Recent Labs  Lab 05/25/20 0357 05/25/20 0933  WBC 23.3*  --   CREATININE 0.85  --   LATICACIDVEN 1.8 1.6    Estimated Creatinine Clearance: 135 mL/min (by C-G formula based on SCr of 0.85 mg/dL).    No Known Allergies  Antimicrobials this admission: Vancomycin 11/1 >>  Zosyn 11/1 >>  Clindamycin 11/1>>  Dose adjustments this admission: N/A  Microbiology results: 11/1 BCx: sent 11/1 Wound cx: sent   Thank you for allowing pharmacy to be a part of this patient's care.  13/1, PharmD, BCCCP Clinical Pharmacist  Phone: [phone] 05/25/2020 4:25 PM  Please check AMION for all [facility] phone numbers After 10:00 PM, call Main Pharmacy [phone]

## 2020-05-26 ENCOUNTER — Encounter (HOSPITAL_COMMUNITY): Payer: Self-pay | Admitting: Surgery

## 2020-05-26 LAB — COMPREHENSIVE METABOLIC PANEL
ALT: 151 U/L — ABNORMAL HIGH (ref 0–44)
AST: 72 U/L — ABNORMAL HIGH (ref 15–41)
Albumin: 2 g/dL — ABNORMAL LOW (ref 3.5–5.0)
Alkaline Phosphatase: 95 U/L (ref 38–126)
Anion gap: 7 (ref 5–15)
BUN: 18 mg/dL (ref 6–20)
CO2: 26 mmol/L (ref 22–32)
Calcium: 7.7 mg/dL — ABNORMAL LOW (ref 8.9–10.3)
Chloride: 101 mmol/L (ref 98–111)
Creatinine, Ser: 0.9 mg/dL (ref 0.61–1.24)
GFR, Estimated: >60 mL/min (ref >60)
Glucose, Bld: 247 mg/dL — ABNORMAL HIGH (ref 70–99)
Potassium: 4.2 mmol/L (ref 3.5–5.1)
Sodium: 134 mmol/L — ABNORMAL LOW (ref 135–145)
Total Bilirubin: 0.9 mg/dL (ref 0.3–1.2)
Total Protein: 5.9 g/dL — ABNORMAL LOW (ref 6.5–8.1)

## 2020-05-26 LAB — GLUCOSE, CAPILLARY
Glucose-Capillary: 167 mg/dL — ABNORMAL HIGH (ref 70–99)
Glucose-Capillary: 188 mg/dL — ABNORMAL HIGH (ref 70–99)
Glucose-Capillary: 152 mg/dL — ABNORMAL HIGH (ref 70–99)

## 2020-05-26 LAB — HEMOGLOBIN A1C
Hgb A1c MFr Bld: 6.3 % — ABNORMAL HIGH (ref 4.8–5.6)
Mean Plasma Glucose: 134.11 mg/dL

## 2020-05-26 LAB — CBC
HCT: 37.7 % — ABNORMAL LOW (ref 39.0–52.0)
Hemoglobin: 12.4 g/dL — ABNORMAL LOW (ref 13.0–17.0)
MCH: 29.6 pg (ref 26.0–34.0)
MCHC: 32.9 g/dL (ref 30.0–36.0)
MCV: 90 fL (ref 80.0–100.0)
Platelets: 219 10*3/uL (ref 150–400)
RBC: 4.19 MIL/uL — ABNORMAL LOW (ref 4.22–5.81)
RDW: 13.9 % (ref 11.5–15.5)
WBC: 20.7 10*3/uL — ABNORMAL HIGH (ref 4.0–10.5)
nRBC: 0 % (ref 0.0–0.2)

## 2020-05-26 LAB — HIV ANTIBODY (ROUTINE TESTING W REFLEX): HIV Screen 4th Generation wRfx: NONREACTIVE

## 2020-05-26 MED ORDER — INSULIN ASPART 100 UNIT/ML ~~LOC~~ SOLN
0.0000 [IU] | Freq: Three times a day (TID) | SUBCUTANEOUS | Status: DC
  Administered 2020-05-26 (×2): 3 [IU] via SUBCUTANEOUS
  Administered 2020-05-27: 5 [IU] via SUBCUTANEOUS
  Administered 2020-05-28: 2 [IU] via SUBCUTANEOUS
  Administered 2020-05-28: 3 [IU] via SUBCUTANEOUS
  Administered 2020-05-29 – 2020-05-31 (×6): 2 [IU] via SUBCUTANEOUS
  Administered 2020-06-01: 3 [IU] via SUBCUTANEOUS
  Administered 2020-06-01 – 2020-06-02 (×2): 2 [IU] via SUBCUTANEOUS

## 2020-05-26 MED ORDER — INSULIN ASPART 100 UNIT/ML ~~LOC~~ SOLN: 0.0000 [IU] | Freq: Every day | SUBCUTANEOUS | Status: DC

## 2020-05-26 MED ORDER — MUPIROCIN 2 % EX OINT
1.0000 "application " | TOPICAL_OINTMENT | Freq: Two times a day (BID) | CUTANEOUS | Status: AC
  Administered 2020-05-26 – 2020-05-31 (×9): 1 via NASAL
  Filled 2020-05-26 (×3): qty 22

## 2020-05-26 MED FILL — Sodium Chloride IV Soln 0.9%: INTRAVENOUS | Qty: 25 | Status: AC

## 2020-05-26 MED FILL — Morphine Sulfate Inj PF 10 MG/ML: INTRAMUSCULAR | Qty: 5 | Status: AC

## 2020-05-26 NOTE — Progress Notes (Signed)
[facility] PROGRESS NOTE    Adam Haney  YIR:[id number] DOB: April 10, 1966 DOA: 05/25/2020 PCP: [name], MD      Brief Narrative:  Adam Haney is a 54 y.o. M with DM, CAD s/p PCI remote, obesity who presented with fever, chills and groin pain for few days, acutely worse day of admission.  In the ER, CT showed soft tissue infection in the right groin with edema and gas, WBC 23, temp 103F.  Surgery seen, taken emergently to OR for I&D right groin and scrotum for Fournier's gangrene.       Assessment & Plan:  Severe sepsis due to Fournier's gangrene S/p I&D by Dr. Bedelia Person 11/1 Patient presented with fever, HR 120, WBC 23K in setting of necrotizing soft tissue infection of the groin and scrotum.  Taken to OR 11/1, blood and intraoperative cultures collected.   -Continue vancomycin, Zosyn, and Clindamycin -Follow culture data  -Gen Surg plan to return to OR on Weds    Transaminitis This is new.  Mild elevation.  Given chronicity, favor that this is sepsis-related, less likely viral. Not statin related. -Trend LFTs -Check viral serologies  Obesity BMI 32   Pre-Diabetes with hyperglycemia Glucose elevated, no insulin given yet.  Not on anti-glycemics at home.  Patient with no prior hsitory of diabetes.  Here A1c is 6.3%, pre-diabetic.  Glucoses elevated due to infection. -Change to carb modified diet -Augment SS correction scale  Coronary disease secondary prevention -Continue Zetia, Crestor -Continue metoprolol  -Hold Plavix, resume when cleared by Surgery          Disposition: Status is: Inpatient  Remains inpatient appropriate because:IV treatments appropriate due to intensity of illness or inability to take PO   Dispo: The patient is from: Home              Anticipated d/c is to: Home              Anticipated d/c date is: 3 days              Patient currently is not medically stable to d/c.    The patient presented with  rapidly progressive necrotizing infection in the groin.  He underwent debridement overnight, and general surgery plan to repeat debridement tomorrow.  They are optimistic that after the second debridement he will be ready for discharge by this weekend.          MDM: The below labs and imaging reports were reviewed and summarized above.  Medication management as above.     DVT prophylaxis: enoxaparin (LOVENOX) injection 40 mg Start: 05/26/20 0800 SCDs Start: 05/25/20 1308  Code Status: FULL Family Communication: wife at the bedside    Consultants:   General Surgery  Procedures:   I&D 11/1 by Dr. Bedelia Person  Antimicrobials:   Vancomycin, Zosyn, and clindamycin 11/1 >>  Culture data:   Blood culture 11/1 x2 -- ngtd  Groin culture intraoperative 11/1 -- polymicrobial pending ID           Subjective: The patient has improved pain is, less swelling in the groin.  He has had no fever, chills, malaise like he did before.  No cough or respiratory symptoms.  Objective: Vitals:   05/26/20 0525 05/26/20 0753 05/26/20 0946 05/26/20 1200  BP:      Pulse:  93    Resp: 12  13 17   Temp:  97.9 F (36.6 C)    TempSrc:  Oral    SpO2: 99%  98% 97%  Weight:      Height:        Intake/Output Summary (Last 24 hours) at 05/26/2020 1525 Last data filed at 05/26/2020 1300 Gross per 24 hour  Intake 758 ml  Output 3220 ml  Net -2462 ml   Filed Weights   05/25/20 1255  Weight: 114.3 kg    Examination: General appearance: adult male, alert and in no acute distress.  Lying in bed HEENT: Anicteric, conjunctiva pink, lids and lashes normal. No nasal deformity, discharge, epistaxis.  Lips moist, dentition in normal repair.   Skin: Warm and dry.  No jaundice.  No suspicious rashes or lesions.  Surgical site not inspection. Cardiac: RRR, nl S1-S2, no murmurs appreciated.  Capillary refill is brisk.  JVP not visible.  No LE edema.  Radial pulses 2+ and symmetric. Respiratory:  Normal respiratory rate and rhythm.  CTAB without rales or wheezes. Abdomen: Abdomen soft.  No TTP or guarding. No ascites, distension, hepatosplenomegaly.   MSK: No deformities or effusions.  Normal muscle bulk and tone Neuro: Awake and alert.  EOMI, moves all extremities. Speech fluent.    Psych: Sensorium intact and responding to questions, attention normal. Affect normal.  Judgment and insight appear normal.    Data Reviewed: I have personally reviewed following labs and imaging studies:  CBC: Recent Labs  Lab 05/25/20 0357 05/26/20 0602  WBC 23.3* 20.7*  HGB 13.9 12.4*  HCT 41.1 37.7*  MCV 89.9 90.0  PLT 270 219   Basic Metabolic Panel: Recent Labs  Lab 05/25/20 0357 05/26/20 0602  NA 132* 134*  K 3.5 4.2  CL 98 101  CO2 20* 26  GLUCOSE 219* 247*  BUN 19 18  CREATININE 0.85 0.90  CALCIUM 8.6* 7.7*   GFR: Estimated Creatinine Clearance: 127.5 mL/min (by C-G formula based on SCr of 0.9 mg/dL). Liver Function Tests: Recent Labs  Lab 05/25/20 0357 05/26/20 0602  AST 200* 72*  ALT 190* 151*  ALKPHOS 135* 95  BILITOT 1.0 0.9  PROT 7.0 5.9*  ALBUMIN 2.5* 2.0*   No results for input(s): LIPASE, AMYLASE in the last 168 hours. No results for input(s): AMMONIA in the last 168 hours. Coagulation Profile: No results for input(s): INR, PROTIME in the last 168 hours. Cardiac Enzymes: No results for input(s): CKTOTAL, CKMB, CKMBINDEX, TROPONINI in the last 168 hours. BNP (last 3 results) No results for input(s): PROBNP in the last 8760 hours. HbA1C: Recent Labs    05/25/20 0357 05/26/20 0602  HGBA1C 6.2* 6.3*   CBG: Recent Labs  Lab 05/26/20 1329  GLUCAP 167*   Lipid Profile: No results for input(s): CHOL, HDL, LDLCALC, TRIG, CHOLHDL, LDLDIRECT in the last 72 hours. Thyroid Function Tests: No results for input(s): TSH, T4TOTAL, FREET4, T3FREE, THYROIDAB in the last 72 hours. Anemia Panel: No results for input(s): VITAMINB12, FOLATE, FERRITIN, TIBC,  IRON, RETICCTPCT in the last 72 hours. Urine analysis:    Component Value Date/Time   COLORURINE YELLOW 01/03/2016 1013   APPEARANCEUR CLEAR 01/03/2016 1013   LABSPEC 1.015 01/03/2016 1013   PHURINE 6.0 01/03/2016 1013   GLUCOSEU NEGATIVE 01/03/2016 1013   HGBUR MODERATE (A) 01/03/2016 1013   BILIRUBINUR NEGATIVE 01/03/2016 1013   KETONESUR NEGATIVE 01/03/2016 1013   PROTEINUR NEGATIVE 01/03/2016 1013   UROBILINOGEN 1.0 10/04/2010 1921   NITRITE NEGATIVE 01/03/2016 1013   LEUKOCYTESUR NEGATIVE 01/03/2016 1013   Sepsis Labs: (procalcitonin:4,lacticacidven:4)  ) Recent Results (from the past 240 hour(s))  COVID-19, Flu A+B and RSV ([facility])  Status: None   Collection Time: 05/20/20 10:20 AM  Result Value Ref Range Status   SARS-CoV-2, NAA Not Detected Not Detected Final    Comment: This nucleic acid amplification test was developed and its performance characteristics determined by [employer]. Nucleic acid amplification tests include RT-PCR and TMA. This test has not been FDA cleared or approved. This test has been authorized by FDA under an Emergency Use Authorization (EUA). This test is only authorized for the duration of time the declaration that circumstances exist justifying the authorization of the emergency use of in vitro diagnostic tests for detection of SARS-CoV-2 virus and/or diagnosis of COVID-19 infection under section 564(b)(1) of the Act, 21 U.S.C. 034VQQ-5(Z) (1), unless the authorization is terminated or revoked sooner. When diagnostic testing is negative, the possibility of a false negative result should be considered in the context of a patient's recent exposures and the presence of clinical signs and symptoms consistent with COVID-19. An individual without symptoms of COVID-19 and who is not shedding SARS-CoV-2 virus wo uld expect to have a negative (not detected) result in this assay.    Influenza A, NAA Not Detected Not Detected  Final   Influenza B, NAA Not Detected Not Detected Final   RSV, NAA Not Detected Not Detected Final  Blood culture (routine x 2)     Status: None (Preliminary result)   Collection Time: 05/25/20  7:38 AM   Specimen: BLOOD  Result Value Ref Range Status   Specimen Description BLOOD LEFT ANTECUBITAL  Final   Special Requests   Final    BOTTLES DRAWN AEROBIC AND ANAEROBIC Blood Culture adequate volume   Culture   Final    NO GROWTH < 24 HOURS Performed at [facility], [address], [place], [place] [address]    Report Status PENDING  Incomplete  Blood culture (routine x 2)     Status: None (Preliminary result)   Collection Time: 05/25/20  9:33 AM   Specimen: BLOOD  Result Value Ref Range Status   Specimen Description BLOOD RIGHT ANTECUBITAL  Final   Special Requests   Final    BOTTLES DRAWN AEROBIC AND ANAEROBIC Blood Culture adequate volume   Culture   Final    NO GROWTH < 24 HOURS Performed at [facility], [address], [place], [place] [address]    Report Status PENDING  Incomplete  Respiratory Panel by RT PCR (Flu A&B, Covid) - Nasopharyngeal Swab     Status: None   Collection Time: 05/25/20 10:06 AM   Specimen: Nasopharyngeal Swab  Result Value Ref Range Status   SARS Coronavirus 2 by RT PCR NEGATIVE NEGATIVE Final    Comment: (NOTE) SARS-CoV-2 target nucleic acids are NOT DETECTED.  The SARS-CoV-2 RNA is generally detectable in upper respiratoy specimens during the acute phase of infection. The lowest concentration of SARS-CoV-2 viral copies this assay can detect is 131 copies/mL. A negative result does not preclude SARS-Cov-2 infection and should not be used as the sole basis for treatment or other patient management decisions. A negative result may occur with  improper specimen collection/handling, submission of specimen other than nasopharyngeal swab, presence of viral mutation(s) within the areas targeted by this assay, and inadequate number of  viral copies (<131 copies/mL). A negative result must be combined with clinical observations, patient history, and epidemiological information. The expected result is Negative.  Fact Sheet for Patients:  https://www.moore.com/  Fact Sheet for Healthcare Providers:  https://www.young.biz/  This test is no t  yet approved or cleared by the [place] FDA and  has been authorized for detection and/or diagnosis of SARS-CoV-2 by FDA under an Emergency Use Authorization (EUA). This EUA will remain  in effect (meaning this test can be used) for the duration of the COVID-19 declaration under Section 564(b)(1) of the Act, 21 U.S.C. section 360bbb-3(b)(1), unless the authorization is terminated or revoked sooner.     Influenza A by PCR NEGATIVE NEGATIVE Final   Influenza B by PCR NEGATIVE NEGATIVE Final    Comment: (NOTE) The Xpert Xpress SARS-CoV-2/FLU/RSV assay is intended as an aid in  the diagnosis of influenza from Nasopharyngeal swab specimens and  should not be used as a sole basis for treatment. Nasal washings and  aspirates are unacceptable for Xpert Xpress SARS-CoV-2/FLU/RSV  testing.  Fact Sheet for Patients: https://www.moore.com/https://www.fda.gov/media/142436/download  Fact Sheet for Healthcare Providers: https://www.young.biz/https://www.fda.gov/media/142435/download  This test is not yet approved or cleared by the [place] FDA and  has been authorized for detection and/or diagnosis of SARS-CoV-2 by  FDA under an Emergency Use Authorization (EUA). This EUA will remain  in effect (meaning this test can be used) for the duration of the  Covid-19 declaration under Section 564(b)(1) of the Act, 21  U.S.C. section 360bbb-3(b)(1), unless the authorization is  terminated or revoked. Performed at [facility], [address] [place], [place] [address]   Aerobic/Anaerobic Culture (surgical/deep wound)     Status: None (Preliminary result)   Collection Time: 05/25/20   2:17 PM   Specimen: PATH Other; Tissue  Result Value Ref Range Status   Specimen Description ABSCESS SCROTUM  Final   Special Requests PT ON CLINDAMYCIN  Final   Gram Stain   Final    RARE WBC PRESENT,BOTH PMN AND MONONUCLEAR ABUNDANT GRAM POSITIVE COCCI MODERATE GRAM NEGATIVE RODS FEW GRAM POSITIVE RODS    Culture   Final    NO GROWTH < 24 HOURS Performed at [facility], [address] [place], [place] [address]    Report Status PENDING  Incomplete         Radiology Studies: DG Abd 1 View  Result Date: 05/25/2020 CLINICAL DATA:  Surgery in the groin region. Concern regarding lost needle. EXAM: ABDOMEN - 1 VIEW COMPARISON:  CT earlier same day FINDINGS: No radiopaque foreign object in the region imaged. IMPRESSION: No radiopaque foreign object. Report called to operating room by myself. There was no answer to the ringing phone. Therefore, PRA call report process instituted. Electronically Signed   By: Paulina FusiMark  Shogry M.D.   On: 05/25/2020 15:57   CT ABDOMEN PELVIS W CONTRAST  Result Date: 05/25/2020 CLINICAL DATA:  Fever. Right pelvic cellulitis and abscess. EXAM: CT ABDOMEN AND PELVIS WITH CONTRAST TECHNIQUE: Multidetector CT imaging of the abdomen and pelvis was performed using the standard protocol following bolus administration of intravenous contrast. CONTRAST:  100mL OMNIPAQUE IOHEXOL 300 MG/ML  SOLN COMPARISON:  01/03/2016 FINDINGS: Lower chest: Mild atelectasis and or scarring at the right lung base. Coronary artery calcification and/or stents. Hepatobiliary: Normal Pancreas: Normal Spleen: Normal Adrenals/Urinary Tract: Adrenal glands are normal. Kidneys are normal. No cyst, mass, stone or hydronephrosis. Bladder is normal. Stomach/Bowel: Normal. Vascular/Lymphatic: Aortic atherosclerosis. No aneurysm. IVC is normal. No retroperitoneal adenopathy. Reproductive: Normal Other: Soft tissue infection in the right groin region with edema and soft tissue gas. No circumscribed  abscess collection. Edema and gas extend into the proximal right scrotal region. No intraperitoneal or retroperitoneal extension. Musculoskeletal: Ordinary lower lumbar degenerative changes.  IMPRESSION: 1. Soft tissue infection in the right groin region with edema and soft tissue gas. No circumscribed abscess collection. Edema and gas extend into the proximal right scrotal region. No intraperitoneal or retroperitoneal extension. 2. Aortic atherosclerosis. Coronary artery calcification and/or stents. Aortic Atherosclerosis (ICD10-I70.0). Electronically Signed   By: Paulina Fusi M.D.   On: 05/25/2020 11:40        Scheduled Meds:  Chlorhexidine Gluconate Cloth  6 each Topical Daily   enoxaparin (LOVENOX) injection  40 mg Subcutaneous Q24H   ezetimibe  10 mg Oral Daily   insulin aspart  0-15 Units Subcutaneous TID WC   insulin aspart  0-5 Units Subcutaneous QHS   metoprolol tartrate  25 mg Oral BID   morphine   Intravenous Q4H   rosuvastatin  40 mg Oral Daily   Continuous Infusions:  sodium chloride 125 mL/hr at 05/26/20 0440   clindamycin (CLEOCIN) IV 600 mg (05/26/20 1415)   piperacillin-tazobactam 3.375 g (05/26/20 1259)   vancomycin 1,000 mg (05/26/20 0946)     LOS: 1 day    Time spent: 25 minutes    Alberteen Sam, MD [facility] 05/26/2020, 3:25 PM     Please page though AMION or Epic secure chat:  For [employer], Higher education careers adviser

## 2020-05-26 NOTE — Progress Notes (Signed)
[facility] Progress Note  1 Day Post-Op  Subjective: CC-  Groin is sore but feeling better than prior to surgery. WBC 20.7 from 23.3, no fever since yesterday evening.   Objective: Vital signs in last 24 hours: Temp:  [97.8 F (36.6 C)-101.3 F (38.5 C)] 97.9 F (36.6 C) (11/02 0753) Pulse Rate:  [59-97] 93 (11/02 0753) Resp:  [12-20] 13 (11/02 0946) BP: (103-145)/(48-91) 103/53 (11/02 0316) SpO2:  [92 %-100 %] 98 % (11/02 0946) Weight:  [114.3 kg] 114.3 kg (11/01 1255)    Intake/Output from previous day: 11/01 0701 - 11/02 0700 In: 1850.3 [P.O.:240; I.V.:1218; IV Piggyback:392.3] Out: 2620 [Urine:2470; Blood:150] Intake/Output this shift: No intake/output data recorded.  PE: Gen:  Alert, NAD, pleasant Pulm:  rate and effort normal Abd: Soft, NT/ND Psych: A&Ox4  Skin: no rashes noted, warm and dry GU: large right groin wound extending down into right hemiscrotum, wound fairly clean with mostly granulation tissue, no purulent drainage, cellulitis improving GU: foley in place with clear yellow urine  Lab Results:  Recent Labs    05/25/20 0357 05/26/20 0602  WBC 23.3* 20.7*  HGB 13.9 12.4*  HCT 41.1 37.7*  PLT 270 219   BMET Recent Labs    05/25/20 0357 05/26/20 0602  NA 132* 134*  K 3.5 4.2  CL 98 101  CO2 20* 26  GLUCOSE 219* 247*  BUN 19 18  CREATININE 0.85 0.90  CALCIUM 8.6* 7.7*   PT/INR No results for input(s): LABPROT, INR in the last 72 hours. CMP     Component Value Date/Time   NA 134 (L) 05/26/2020 0602   K 4.2 05/26/2020 0602   CL 101 05/26/2020 0602   CO2 26 05/26/2020 0602   GLUCOSE 247 (H) 05/26/2020 0602   BUN 18 05/26/2020 0602   CREATININE 0.90 05/26/2020 0602   CALCIUM 7.7 (L) 05/26/2020 0602   PROT 5.9 (L) 05/26/2020 0602   PROT 6.6 08/07/2019 0844   ALBUMIN 2.0 (L) 05/26/2020 0602   ALBUMIN 4.2 08/07/2019 0844   AST 72 (H) 05/26/2020 0602   ALT 151 (H) 05/26/2020 0602   ALKPHOS 95 05/26/2020 0602    BILITOT 0.9 05/26/2020 0602   BILITOT 0.8 08/07/2019 0844   GFRNONAA >60 05/26/2020 0602   GFRAA >60 01/03/2016 1104   Lipase     Component Value Date/Time   LIPASE 18 04/21/2010 0750       Studies/Results: DG Abd 1 View  Result Date: 05/25/2020 CLINICAL DATA:  Surgery in the groin region. Concern regarding lost needle. EXAM: ABDOMEN - 1 VIEW COMPARISON:  CT earlier same day FINDINGS: No radiopaque foreign object in the region imaged. IMPRESSION: No radiopaque foreign object. Report called to operating room by myself. There was no answer to the ringing phone. Therefore, PRA call report process instituted. Electronically Signed   By: Paulina Fusi M.D.   On: 05/25/2020 15:57   CT ABDOMEN PELVIS W CONTRAST  Result Date: 05/25/2020 CLINICAL DATA:  Fever. Right pelvic cellulitis and abscess. EXAM: CT ABDOMEN AND PELVIS WITH CONTRAST TECHNIQUE: Multidetector CT imaging of the abdomen and pelvis was performed using the standard protocol following bolus administration of intravenous contrast. CONTRAST:  OMNIPAQUE IOHEXOL 300 MG/ML  SOLN COMPARISON:  01/03/2016 FINDINGS: Lower chest: Mild atelectasis and or scarring at the right lung base. Coronary artery calcification and/or stents. Hepatobiliary: Normal Pancreas: Normal Spleen: Normal Adrenals/Urinary Tract: Adrenal glands are normal. Kidneys are normal. No cyst, mass, stone or hydronephrosis. Bladder is normal. Stomach/Bowel: Normal.  Vascular/Lymphatic: Aortic atherosclerosis. No aneurysm. IVC is normal. No retroperitoneal adenopathy. Reproductive: Normal Other: Soft tissue infection in the right groin region with edema and soft tissue gas. No circumscribed abscess collection. Edema and gas extend into the proximal right scrotal region. No intraperitoneal or retroperitoneal extension. Musculoskeletal: Ordinary lower lumbar degenerative changes. IMPRESSION: 1. Soft tissue infection in the right groin region with edema and soft tissue gas. No  circumscribed abscess collection. Edema and gas extend into the proximal right scrotal region. No intraperitoneal or retroperitoneal extension. 2. Aortic atherosclerosis. Coronary artery calcification and/or stents. Aortic Atherosclerosis (ICD10-I70.0). Electronically Signed   By: Paulina Fusi M.D.   On: 05/25/2020 11:40    Anti-infectives: Anti-infectives (From admission, onward)   Start     Dose/Rate Route Frequency Ordered Stop   05/25/20 2200  vancomycin (VANCOCIN) IVPB 1000 mg/200 mL premix        1,000 mg 200 mL/hr over 60 Minutes Intravenous Every 12 hours 05/25/20 1626     05/25/20 2000  clindamycin (CLEOCIN) IVPB 600 mg        600 mg 100 mL/hr over 30 Minutes Intravenous Every 6 hours 05/25/20 1838     05/25/20 2000  piperacillin-tazobactam (ZOSYN) IVPB 3.375 g        3.375 g 12.5 mL/hr over 240 Minutes Intravenous Every 8 hours 05/25/20 1838     05/25/20 1245  clindamycin (CLEOCIN) IVPB 900 mg        900 mg 100 mL/hr over 30 Minutes Intravenous  Once 05/25/20 1235 05/25/20 1437   05/25/20 1015  vancomycin (VANCOREADY) IVPB 1750 mg/350 mL        1,750 mg 175 mL/hr over 120 Minutes Intravenous  Once 05/25/20 0925 05/25/20 1841   05/25/20 0930  piperacillin-tazobactam (ZOSYN) IVPB 3.375 g        3.375 g 100 mL/hr over 30 Minutes Intravenous  Once 05/25/20 0925 05/25/20 1022       Assessment/Plan 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 Bedelia Person - POD#1 - GS with gram+ cocci, gram neg rods, gram+ rods, culture and path pending - Dressing changed at bedside. Does not need to having another dressing change today. Plan to return to the OR tomorrow. Continue IV antibiotics and follow culture. NPO after midnight.   ID -  zosyn/clinda/vancomycin 11/1>> FEN - CM diet VTE - lovenox Foley - placed 11/1 Follow up - TBD   LOS: 1 day    Franne Forts, [facility] 05/26/2020, 12:14 PM Please see Amion for pager number during day hours 7:00am-4:30pm

## 2020-05-27 ENCOUNTER — Encounter (HOSPITAL_COMMUNITY): Payer: Self-pay

## 2020-05-27 ENCOUNTER — Encounter (HOSPITAL_COMMUNITY): Admission: EM | Disposition: A | Discharge status: Home Health | Payer: Self-pay | Source: Home / Self Care

## 2020-05-27 ENCOUNTER — Inpatient Hospital Stay (HOSPITAL_COMMUNITY): Payer: 59 | Admitting: Anesthesiology

## 2020-05-27 HISTORY — PX: INCISION AND DRAINAGE OF WOUND: SHX1803

## 2020-05-27 HISTORY — PX: APPLICATION OF WOUND VAC: SHX5189

## 2020-05-27 LAB — CBC
HCT: 34.9 % — ABNORMAL LOW (ref 39.0–52.0)
Hemoglobin: 11.3 g/dL — ABNORMAL LOW (ref 13.0–17.0)
MCH: 29.5 pg (ref 26.0–34.0)
MCHC: 32.4 g/dL (ref 30.0–36.0)
MCV: 91.1 fL (ref 80.0–100.0)
Platelets: 237 10*3/uL (ref 150–400)
RBC: 3.83 MIL/uL — ABNORMAL LOW (ref 4.22–5.81)
RDW: 14 % (ref 11.5–15.5)
WBC: 18.5 10*3/uL — ABNORMAL HIGH (ref 4.0–10.5)
nRBC: 0.2 % (ref 0.0–0.2)

## 2020-05-27 LAB — BASIC METABOLIC PANEL
Anion gap: 7 (ref 5–15)
BUN: 20 mg/dL (ref 6–20)
CO2: 30 mmol/L (ref 22–32)
Calcium: 7.6 mg/dL — ABNORMAL LOW (ref 8.9–10.3)
Chloride: 97 mmol/L — ABNORMAL LOW (ref 98–111)
Creatinine, Ser: 0.93 mg/dL (ref 0.61–1.24)
GFR, Estimated: >60 mL/min (ref >60)
Glucose, Bld: 141 mg/dL — ABNORMAL HIGH (ref 70–99)
Potassium: 3.7 mmol/L (ref 3.5–5.1)
Sodium: 134 mmol/L — ABNORMAL LOW (ref 135–145)

## 2020-05-27 LAB — HEPATITIS PANEL, ACUTE
HCV Ab: NONREACTIVE
Hep A IgM: NONREACTIVE
Hep B C IgM: NONREACTIVE
Hepatitis B Surface Ag: NONREACTIVE

## 2020-05-27 LAB — HEPATIC FUNCTION PANEL
ALT: 215 U/L — ABNORMAL HIGH (ref 0–44)
AST: 180 U/L — ABNORMAL HIGH (ref 15–41)
Albumin: 1.7 g/dL — ABNORMAL LOW (ref 3.5–5.0)
Alkaline Phosphatase: 91 U/L (ref 38–126)
Bilirubin, Direct: 0.2 mg/dL (ref 0.0–0.2)
Indirect Bilirubin: 0.6 mg/dL (ref 0.3–0.9)
Total Bilirubin: 0.8 mg/dL (ref 0.3–1.2)
Total Protein: 5.5 g/dL — ABNORMAL LOW (ref 6.5–8.1)

## 2020-05-27 LAB — GLUCOSE, CAPILLARY
Glucose-Capillary: 90 mg/dL (ref 70–99)
Glucose-Capillary: 87 mg/dL (ref 70–99)
Glucose-Capillary: 104 mg/dL — ABNORMAL HIGH (ref 70–99)
Glucose-Capillary: 236 mg/dL — ABNORMAL HIGH (ref 70–99)
Glucose-Capillary: 121 mg/dL — ABNORMAL HIGH (ref 70–99)

## 2020-05-27 LAB — SURGICAL PATHOLOGY

## 2020-05-27 LAB — CK: Total CK: 50 U/L (ref 49–397)

## 2020-05-27 SURGERY — IRRIGATION AND DEBRIDEMENT WOUND
Anesthesia: General | Site: Groin | Laterality: Right

## 2020-05-27 MED ORDER — MORPHINE SULFATE 2 MG/ML IV SOLN
INTRAVENOUS | Status: DC
  Administered 2020-05-27 – 2020-05-28 (×2): 2 mg via INTRAVENOUS
  Filled 2020-05-27: qty 40
  Filled 2020-05-27: qty 25

## 2020-05-27 MED ORDER — PROPOFOL 10 MG/ML IV BOLUS
INTRAVENOUS | Status: DC | PRN
  Administered 2020-05-27: 150 mg via INTRAVENOUS

## 2020-05-27 MED ORDER — ALBUMIN HUMAN 5 % IV SOLN: INTRAVENOUS | Status: DC | PRN

## 2020-05-27 MED ORDER — SUCCINYLCHOLINE CHLORIDE 200 MG/10ML IV SOSY
PREFILLED_SYRINGE | INTRAVENOUS | Status: AC
  Filled 2020-05-27: qty 10

## 2020-05-27 MED ORDER — PHENYLEPHRINE HCL-NACL 10-0.9 MG/250ML-% IV SOLN
INTRAVENOUS | Status: DC | PRN
  Administered 2020-05-27: 25 ug/min via INTRAVENOUS

## 2020-05-27 MED ORDER — CHLORHEXIDINE GLUCONATE 0.12 % MT SOLN
15.0000 mL | Freq: Once | OROMUCOSAL | Status: AC
  Administered 2020-05-27: 15 mL via OROMUCOSAL
  Filled 2020-05-27: qty 15

## 2020-05-27 MED ORDER — LACTATED RINGERS IV SOLN: INTRAVENOUS | Status: DC | PRN

## 2020-05-27 MED ORDER — ONDANSETRON HCL 4 MG/2ML IJ SOLN
INTRAMUSCULAR | Status: AC
  Filled 2020-05-27: qty 2

## 2020-05-27 MED ORDER — LIDOCAINE 2% (20 MG/ML) 5 ML SYRINGE
INTRAMUSCULAR | Status: AC
  Filled 2020-05-27: qty 5

## 2020-05-27 MED ORDER — 0.9 % SODIUM CHLORIDE (POUR BTL) OPTIME
TOPICAL | Status: DC | PRN
  Administered 2020-05-27: 1000 mL

## 2020-05-27 MED ORDER — BUPIVACAINE HCL (PF) 0.25 % IJ SOLN
INTRAMUSCULAR | Status: AC
  Filled 2020-05-27: qty 30

## 2020-05-27 MED ORDER — HYDROMORPHONE HCL 1 MG/ML IJ SOLN: 0.2500 mg | INTRAMUSCULAR | Status: DC | PRN

## 2020-05-27 MED ORDER — OXYCODONE HCL 5 MG PO TABS: 5.0000 mg | ORAL_TABLET | Freq: Once | ORAL | Status: DC | PRN

## 2020-05-27 MED ORDER — OXYCODONE HCL 5 MG/5ML PO SOLN: 5.0000 mg | Freq: Once | ORAL | Status: DC | PRN

## 2020-05-27 MED ORDER — MIDAZOLAM HCL 2 MG/2ML IJ SOLN
INTRAMUSCULAR | Status: DC | PRN
  Administered 2020-05-27: 2 mg via INTRAVENOUS

## 2020-05-27 MED ORDER — ONDANSETRON HCL 4 MG/2ML IJ SOLN
INTRAMUSCULAR | Status: DC | PRN
  Administered 2020-05-27: 4 mg via INTRAVENOUS

## 2020-05-27 MED ORDER — MORPHINE SULFATE 1 MG/ML IJ SOLN
INTRAMUSCULAR | Status: DC
  Filled 2020-05-27: qty 1

## 2020-05-27 MED ORDER — FENTANYL CITRATE (PF) 250 MCG/5ML IJ SOLN
INTRAMUSCULAR | Status: DC | PRN
  Administered 2020-05-27 (×5): 50 ug via INTRAVENOUS

## 2020-05-27 MED ORDER — DEXAMETHASONE SODIUM PHOSPHATE 10 MG/ML IJ SOLN
INTRAMUSCULAR | Status: DC | PRN
  Administered 2020-05-27: 5 mg via INTRAVENOUS

## 2020-05-27 MED ORDER — PHENYLEPHRINE 40 MCG/ML (10ML) SYRINGE FOR IV PUSH (FOR BLOOD PRESSURE SUPPORT)
PREFILLED_SYRINGE | INTRAVENOUS | Status: AC
  Filled 2020-05-27: qty 10

## 2020-05-27 MED ORDER — PROPOFOL 10 MG/ML IV BOLUS
INTRAVENOUS | Status: AC
  Filled 2020-05-27: qty 40

## 2020-05-27 MED ORDER — DEXAMETHASONE SODIUM PHOSPHATE 10 MG/ML IJ SOLN
INTRAMUSCULAR | Status: AC
  Filled 2020-05-27: qty 1

## 2020-05-27 MED ORDER — LIDOCAINE 2% (20 MG/ML) 5 ML SYRINGE
INTRAMUSCULAR | Status: DC | PRN
  Administered 2020-05-27: 100 mg via INTRAVENOUS

## 2020-05-27 MED ORDER — FENTANYL CITRATE (PF) 250 MCG/5ML IJ SOLN
INTRAMUSCULAR | Status: AC
  Filled 2020-05-27: qty 5

## 2020-05-27 MED ORDER — LACTATED RINGERS IV SOLN: INTRAVENOUS | Status: DC

## 2020-05-27 MED ORDER — PROMETHAZINE HCL 25 MG/ML IJ SOLN: 6.2500 mg | INTRAMUSCULAR | Status: DC | PRN

## 2020-05-27 MED ORDER — SUCCINYLCHOLINE CHLORIDE 200 MG/10ML IV SOSY
PREFILLED_SYRINGE | INTRAVENOUS | Status: DC | PRN
  Administered 2020-05-27: 180 mg via INTRAVENOUS

## 2020-05-27 MED ORDER — MIDAZOLAM HCL 2 MG/2ML IJ SOLN
INTRAMUSCULAR | Status: AC
  Filled 2020-05-27: qty 2

## 2020-05-27 SURGICAL SUPPLY — 33 items
APL SKNCLS STERI-STRIP NONHPOA (GAUZE/BANDAGES/DRESSINGS) ×4
BENZOIN TINCTURE PRP APPL 2/3 (GAUZE/BANDAGES/DRESSINGS) ×12 IMPLANT
BNDG GAUZE ELAST 4 BULKY (GAUZE/BANDAGES/DRESSINGS) IMPLANT
CANISTER SUCT 3000ML PPV (MISCELLANEOUS) ×3 IMPLANT
COVER SURGICAL LIGHT HANDLE (MISCELLANEOUS) ×3 IMPLANT
COVER WAND RF STERILE (DRAPES) ×3 IMPLANT
DRAPE LAPAROSCOPIC ABDOMINAL (DRAPES) IMPLANT
DRAPE LAPAROTOMY 100X72 PEDS (DRAPES) IMPLANT
DRSG PAD ABDOMINAL 8X10 ST (GAUZE/BANDAGES/DRESSINGS) IMPLANT
DRSG VAC ATS LRG SENSATRAC (GAUZE/BANDAGES/DRESSINGS) ×6 IMPLANT
DRSG VERSA FOAM LRG 10X15 (GAUZE/BANDAGES/DRESSINGS) ×3 IMPLANT
ELECT REM PT RETURN 9FT ADLT (ELECTROSURGICAL) ×3
ELECTRODE REM PT RTRN 9FT ADLT (ELECTROSURGICAL) ×1 IMPLANT
GAUZE SPONGE 4X4 12PLY STRL (GAUZE/BANDAGES/DRESSINGS) IMPLANT
GLOVE BIO SURGEON STRL SZ 6.5 (GLOVE) ×2 IMPLANT
GLOVE BIO SURGEONS STRL SZ 6.5 (GLOVE) ×1
GLOVE BIOGEL PI IND STRL 6 (GLOVE) ×1 IMPLANT
GLOVE BIOGEL PI INDICATOR 6 (GLOVE) ×2
GOWN STRL REUS W/ TWL LRG LVL3 (GOWN DISPOSABLE) ×2 IMPLANT
GOWN STRL REUS W/TWL LRG LVL3 (GOWN DISPOSABLE) ×6
KIT BASIN OR (CUSTOM PROCEDURE TRAY) ×3 IMPLANT
KIT TURNOVER KIT B (KITS) ×3 IMPLANT
NS IRRIG 1000ML POUR BTL (IV SOLUTION) ×3 IMPLANT
PACK GENERAL/GYN (CUSTOM PROCEDURE TRAY) ×3 IMPLANT
PAD ARMBOARD 7.5X6 YLW CONV (MISCELLANEOUS) ×3 IMPLANT
PENCIL SMOKE EVACUATOR (MISCELLANEOUS) ×3 IMPLANT
STAPLER VISISTAT 35W (STAPLE) ×3 IMPLANT
SUT ETHILON 2 0 FS 18 (SUTURE) ×6 IMPLANT
SUT VIC AB 2-0 SH 18 (SUTURE) ×6 IMPLANT
SWAB COLLECTION DEVICE MRSA (MISCELLANEOUS) IMPLANT
SWAB CULTURE ESWAB REG 1ML (MISCELLANEOUS) IMPLANT
TOWEL GREEN STERILE (TOWEL DISPOSABLE) ×3 IMPLANT
TOWEL GREEN STERILE FF (TOWEL DISPOSABLE) ×3 IMPLANT

## 2020-05-27 NOTE — Progress Notes (Signed)
General Surgery Follow Up Note  Subjective:    Overnight Issues:   Objective:  Vital signs for last 24 hours: Temp:  [97.7 F (36.5 C)-98.6 F (37 C)] 98.5 F (36.9 C) (11/03 0753) Pulse Rate:  [60-69] 69 (11/03 0753) Resp:  [6-20] 20 (11/03 0753) BP: (102-117)/(61-75) 117/61 (11/03 0753) SpO2:  [90 %-99 %] 97 % (11/03 0753) Weight:  [114.3 kg] 114.3 kg (11/03 0835)  Hemodynamic parameters for last 24 hours:    Intake/Output from previous day: 11/02 0701 - 11/03 0700 In: 1850 [P.O.:100; I.V.:1250; IV Piggyback:500] Out: 750 [Urine:750]  Intake/Output this shift: No intake/output data recorded.  Vent settings for last 24 hours:    Physical Exam:  Gen: comfortable, no distress Neuro: non-focal exam HEENT: PERRL Neck: supple CV: RRR Pulm: unlabored breathing Abd: soft, NT GU: clear yellow urine, foley, R groin with persistent erythema  Extr: wwp, no edema  Results for orders placed or performed during the hospital encounter of 05/25/20 (from the past 24 hour(s))  Glucose, capillary     Status: Abnormal   Collection Time: 05/26/20  1:29 PM  Result Value Ref Range   Glucose-Capillary 167 (H) 70 - 99 mg/dL  Glucose, capillary     Status: Abnormal   Collection Time: 05/26/20  6:04 PM  Result Value Ref Range   Glucose-Capillary 188 (H) 70 - 99 mg/dL  Glucose, capillary     Status: Abnormal   Collection Time: 05/26/20  9:23 PM  Result Value Ref Range   Glucose-Capillary 152 (H) 70 - 99 mg/dL  Hepatitis panel, acute     Status: None   Collection Time: 05/27/20  1:49 AM  Result Value Ref Range   Hepatitis B Surface Ag NON REACTIVE NON REACTIVE   HCV Ab NON REACTIVE NON REACTIVE   Hep A IgM NON REACTIVE NON REACTIVE   Hep B C IgM NON REACTIVE NON REACTIVE  Hepatic function panel     Status: Abnormal   Collection Time: 05/27/20  1:49 AM  Result Value Ref Range   Total Protein 5.5 (L) 6.5 - 8.1 g/dL   Albumin 1.7 (L) 3.5 - 5.0 g/dL   AST 161 (H) 15 - 41 U/L    ALT 215 (H) 0 - 44 U/L   Alkaline Phosphatase 91 38 - 126 U/L   Total Bilirubin 0.8 0.3 - 1.2 mg/dL   Bilirubin, Direct 0.2 0.0 - 0.2 mg/dL   Indirect Bilirubin 0.6 0.3 - 0.9 mg/dL  CBC     Status: Abnormal   Collection Time: 05/27/20  1:49 AM  Result Value Ref Range   WBC 18.5 (H) 4.0 - 10.5 K/uL   RBC 3.83 (L) 4.22 - 5.81 MIL/uL   Hemoglobin 11.3 (L) 13.0 - 17.0 g/dL   HCT 09.6 (L) 39 - 52 %   MCV 91.1 80.0 - 100.0 fL   MCH 29.5 26.0 - 34.0 pg   MCHC 32.4 30.0 - 36.0 g/dL   RDW 04.5 40.9 - 81.1 %   Platelets 237 150 - 400 K/uL   nRBC 0.2 0.0 - 0.2 %  Basic metabolic panel     Status: Abnormal   Collection Time: 05/27/20  1:49 AM  Result Value Ref Range   Sodium 134 (L) 135 - 145 mmol/L   Potassium 3.7 3.5 - 5.1 mmol/L   Chloride 97 (L) 98 - 111 mmol/L   CO2 30 22 - 32 mmol/L   Glucose, Bld 141 (H) 70 - 99 mg/dL   BUN 20  6 - 20 mg/dL   Creatinine, Ser 4.49 0.61 - 1.24 mg/dL   Calcium 7.6 (L) 8.9 - 10.3 mg/dL   GFR, Estimated >67 >59 mL/min   Anion gap 7 5 - 15  Glucose, capillary     Status: None   Collection Time: 05/27/20  8:04 AM  Result Value Ref Range   Glucose-Capillary 90 70 - 99 mg/dL    Assessment & Plan:  Present on Admission: . Necrotizing soft tissue infection . Coronary atherosclerosis of native coronary artery . Hyperlipidemia . Tobacco abuse . Hyperglycemia    LOS: 2 days   Additional comments:I reviewed the patient's new clinical lab test results.   and I reviewed the patients new imaging test results.    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 Bedelia Person - POD#2 - GS with gram+ cocci, gram neg rods, gram+ rods, culture and path pending - Dressing changed at bedside yesterday. OR today for  second look and possible additional I&D. Informed consent obtained.  ID - zosyn/clinda/vancomycin 11/1>> FEN - CM diet VTE - lovenox Foley - placed 11/1 Follow up - TBD   Diamantina Monks, MD Trauma & General Surgery Please use [email or web address] to contact on call provider  05/27/2020  *Care during the described time interval was provided by me. I have reviewed this patient's available data, including medical history, events of note, physical examination and test results as part of my evaluation.

## 2020-05-27 NOTE — Anesthesia Postprocedure Evaluation (Signed)
Anesthesia Post Note  Patient: Adam Haney  Procedure(s) Performed: IRRIGATION AND DEBRIDEMENT OF RIGHT GROIN WOUND (Right Groin) APPLICATION OF WOUND VAC (Right Groin)     Patient location during evaluation: PACU Anesthesia Type: General Level of consciousness: sedated Pain management: pain level controlled Vital Signs Assessment: post-procedure vital signs reviewed and stable Respiratory status: spontaneous breathing and respiratory function stable Cardiovascular status: stable Postop Assessment: no apparent nausea or vomiting Anesthetic complications: no   No complications documented.  Last Vitals:  Vitals:   05/27/20 1145 05/27/20 1200  BP: 132/75   Pulse: 70   Resp: 19 20  Temp: 36.4 C   SpO2: 97% 98%    Last Pain:  Vitals:   05/27/20 1200  TempSrc:   PainSc: 4                  Candra R Perkins Molina

## 2020-05-27 NOTE — Progress Notes (Signed)
Morphine syringe 1mg /ml sent up but [facility] did not accept this dosage. Instructed to waste with RN on floor. Wasted in the med room but no way to document waste in pyxis considering the medication was not pulled from the pyxis. Full 30 mL was wasted. See RN sign off for verification. Rn .

## 2020-05-27 NOTE — Anesthesia Preprocedure Evaluation (Signed)
Anesthesia Evaluation  Patient identified by MRN, date of birth, ID band Patient awake    Reviewed: Allergy & Precautions, H&P , NPO status , Patient's Chart, lab work & pertinent test results  Airway Mallampati: II   Neck ROM: full    Dental   Pulmonary former smoker,    breath sounds clear to auscultation       Cardiovascular + CAD, + Past MI and + Cardiac Stents   Rhythm:regular Rate:Normal     Neuro/Psych negative neurological ROS  negative psych ROS   GI/Hepatic   Endo/Other  negative endocrine ROS  Renal/GU Renal disease (kidney stone)  negative genitourinary   Musculoskeletal negative musculoskeletal ROS (+)   Abdominal   Peds negative pediatric ROS (+)  Hematology negative hematology ROS (+)   Anesthesia Other Findings   Reproductive/Obstetrics negative OB ROS                            Anesthesia Physical  Anesthesia Plan  ASA: III  Anesthesia Plan: General   Post-op Pain Management:    Induction: Intravenous  PONV Risk Score and Plan: 2 and Ondansetron, Dexamethasone, Midazolam and Treatment may vary due to age or medical condition  Airway Management Planned: Oral ETT  Additional Equipment:   Intra-op Plan:   Post-operative Plan: Extubation in OR  Informed Consent: I have reviewed the patients History and Physical, chart, labs and discussed the procedure including the risks, benefits and alternatives for the proposed anesthesia with the patient or authorized representative who has indicated his/her understanding and acceptance.       Plan Discussed with: CRNA, Anesthesiologist and Surgeon  Anesthesia Plan Comments:         Anesthesia Quick Evaluation

## 2020-05-27 NOTE — OR Nursing (Signed)
Pt is awake,alert and oriented.Pt and/or family verbalized understanding of poc and discharge instructions. Reviewed admission and on going care with receiving RN. Pt is in NAD at this time and is ready to be transferred to floor. Will con't to monitor until pt is transferred. Belongings on bed with patient Pt on 02 1l/min Wound vac in place and working

## 2020-05-27 NOTE — Progress Notes (Signed)
PROGRESS NOTE    Adam Haney  RFF:[id number] DOB: [birth year] DOA: 05/25/2020 PCP: [name], MD   Chief Complain: Fever, chills, groin pain  Brief Narrative:  Patient is a 54 year old male with history of diabetes type 2, coronary artery  disease status post PCI, obesity who presented with fever, chills and groin pain from home.  On presentation, CT showed soft tissue infection in the groin with edema and gas, elevated leukocytosis and he was febrile presentation.  Was suspected to have Fournier's gangrene and was seen by general surgery and was taken to the OR for I&D of the right groin and scrotum.  Plan for another I&D today.  [facility] is consulting for medical assistance.  Assessment & Plan:   Principal Problem:   Necrotizing soft tissue infection Active Problems:   Tobacco abuse   Coronary atherosclerosis of native coronary artery   Hyperlipidemia   Hyperglycemia   Class 1 obesity due to excess calories with body mass index (BMI) of 32.0 to 32.9 in adult   Severe sepsis: Presented with fever leukocytosis, tachycardia.  Will follow blood cultures.  Currently on vancomycin, Zosyn and clindamycin.  Still has significant leukocytosis but is afebrile now.  Fournier's gangrene: Underwent I&D on 11/1, plan for another I&D today.  General surgery managing.Wound culture showing  mixed organisms.  On PCA pump for pain control  Elevated liver enzymes: Viral hepatitis panel negative.  Low suspicion from statin.  Could be from rhabdomyolysis from Fournier's gangrene.  Check CK.  Prediabetes with hyperglycemia: Elevated blood sugars.  Not on antihyperglycemic's at home.  Hemoglobin A1c of 6.3.  Glucose elevated due to sepsis.  Continue sliding-scale insulin.  Coronary artery disease: Currently stable.  Status post PCI artery.  On Crestor, Zetia and metoprolol.  Takes Plavix at home which is on hold for surgery.         DVT prophylaxis:Lovenox Code Status: Full Family  Communication: Discussed with wife at the bedside Status is: Inpatient    Procedures: I&D  Antimicrobials:  Anti-infectives (From admission, onward)   Start     Dose/Rate Route Frequency Ordered Stop   05/25/20 2200  [MAR Hold]  vancomycin (VANCOCIN) IVPB 1000 mg/200 mL premix        (MAR Hold since Wed 05/27/2020 at 0816.Hold Reason: Transfer to a Procedural area.)   1,000 mg 200 mL/hr over 60 Minutes Intravenous Every 12 hours 05/25/20 1626     05/25/20 2000  [MAR Hold]  clindamycin (CLEOCIN) IVPB 600 mg        (MAR Hold since Wed 05/27/2020 at 0816.Hold Reason: Transfer to a Procedural area.)   600 mg 100 mL/hr over 30 Minutes Intravenous Every 6 hours 05/25/20 1838     05/25/20 2000  [MAR Hold]  piperacillin-tazobactam (ZOSYN) IVPB 3.375 g        (MAR Hold since Wed 05/27/2020 at 0816.Hold Reason: Transfer to a Procedural area.)   3.375 g 12.5 mL/hr over 240 Minutes Intravenous Every 8 hours 05/25/20 1838     05/25/20 1245  clindamycin (CLEOCIN) IVPB 900 mg        900 mg 100 mL/hr over 30 Minutes Intravenous  Once 05/25/20 1235 05/25/20 1437   05/25/20 1015  vancomycin (VANCOREADY) IVPB 1750 mg/350 mL        1,750 mg 175 mL/hr over 120 Minutes Intravenous  Once 05/25/20 0925 05/25/20 1841   05/25/20 0930  piperacillin-tazobactam (ZOSYN) IVPB 3.375 g        3.375 g 100 mL/hr over  30 Minutes Intravenous  Once 05/25/20 0925 05/25/20 1022      Subjective:  Patient seen and examined at the bedside this afternoon after he came for more.  Complains of severe pain on the operated side.  Wife at bedside.  Objective: Vitals:   05/27/20 0200 05/27/20 0412 05/27/20 0416 05/27/20 0753  BP:  103/72  117/61  Pulse:  64  69  Resp: 17 17 19 20   Temp:  97.7 F (36.5 C)  98.5 F (36.9 C)  TempSrc:  Axillary  Oral  SpO2:  96% 97% 97%  Weight:      Height:        Intake/Output Summary (Last 24 hours) at 05/27/2020 [id number] Last data filed at 05/27/2020 0500 Gross per 24 hour  Intake 1850  ml  Output 750 ml  Net 1100 ml   Filed Weights   05/25/20 1255  Weight: 114.3 kg    Examination:  General exam: Uncomfortable due to pain,obese HEENT:PERRL,Oral mucosa moist, Ear/Nose normal on gross exam Respiratory system: Bilateral equal air entry, normal vesicular breath sounds, no wheezes or crackles  Cardiovascular system: S1 & S2 heard, RRR. No JVD, murmurs, rubs, gallops or clicks. No pedal edema. Gastrointestinal system: Abdomen is nondistended, soft and nontender. No organomegaly or masses felt. Normal bowel sounds heard. Central nervous system: Alert and oriented. No focal neurological deficits. Extremities: No edema, no clubbing ,no cyanosis, distal peripheral pulses palpable. Skin: No rashes, lesions or ulcers,no icterus ,no pallor GU: Wound vac on right groin,foley    Data Reviewed: I have personally reviewed following labs and imaging studies  CBC: Recent Labs  Lab 05/25/20 0357 05/26/20 0602 05/27/20 0149  WBC 23.3* 20.7* 18.5*  HGB 13.9 12.4* 11.3*  HCT 41.1 37.7* 34.9*  MCV 89.9 90.0 91.1  PLT 270 219 237   Basic Metabolic Panel: Recent Labs  Lab 05/25/20 0357 05/26/20 0602 05/27/20 0149  NA 132* 134* 134*  K 3.5 4.2 3.7  CL 98 101 97*  CO2 20* 26 30  GLUCOSE 219* 247* 141*  BUN 19 18 20   CREATININE 0.85 0.90 0.93  CALCIUM 8.6* 7.7* 7.6*   GFR: Estimated Creatinine Clearance: 123.4 mL/min (by C-G formula based on SCr of 0.93 mg/dL). Liver Function Tests: Recent Labs  Lab 05/25/20 0357 05/26/20 0602 05/27/20 0149  AST 200* 72* 180*  ALT 190* 151* 215*  ALKPHOS 135* 95 91  BILITOT 1.0 0.9 0.8  PROT 7.0 5.9* 5.5*  ALBUMIN 2.5* 2.0* 1.7*   No results for input(s): LIPASE, AMYLASE in the last 168 hours. No results for input(s): AMMONIA in the last 168 hours. Coagulation Profile: No results for input(s): INR, PROTIME in the last 168 hours. Cardiac Enzymes: No results for input(s): CKTOTAL, CKMB, CKMBINDEX, TROPONINI in the last 168  hours. BNP (last 3 results) No results for input(s): PROBNP in the last 8760 hours. HbA1C: Recent Labs    05/25/20 0357 05/26/20 0602  HGBA1C 6.2* 6.3*   CBG: Recent Labs  Lab 05/26/20 1329 05/26/20 1804 05/26/20 2123 05/27/20 0804  GLUCAP 167* 188* 152* 90   Lipid Profile: No results for input(s): CHOL, HDL, LDLCALC, TRIG, CHOLHDL, LDLDIRECT in the last 72 hours. Thyroid Function Tests: No results for input(s): TSH, T4TOTAL, FREET4, T3FREE, THYROIDAB in the last 72 hours. Anemia Panel: No results for input(s): VITAMINB12, FOLATE, FERRITIN, TIBC, IRON, RETICCTPCT in the last 72 hours. Sepsis Labs: Recent Labs  Lab 05/25/20 0357 05/25/20 0933  LATICACIDVEN 1.8 1.6    Recent Results (from  the past 240 hour(s))  COVID-19, Flu A+B and RSV ([facility])     Status: None   Collection Time: 05/20/20 10:20 AM  Result Value Ref Range Status   SARS-CoV-2, NAA Not Detected Not Detected Final    Comment: This nucleic acid amplification test was developed and its performance characteristics determined by [employer]. Nucleic acid amplification tests include RT-PCR and TMA. This test has not been FDA cleared or approved. This test has been authorized by FDA under an Emergency Use Authorization (EUA). This test is only authorized for the duration of time the declaration that circumstances exist justifying the authorization of the emergency use of in vitro diagnostic tests for detection of SARS-CoV-2 virus and/or diagnosis of COVID-19 infection under section 564(b)(1) of the Act, 21 U.S.C. 098JXB-1(Y) (1), unless the authorization is terminated or revoked sooner. When diagnostic testing is negative, the possibility of a false negative result should be considered in the context of a patient's recent exposures and the presence of clinical signs and symptoms consistent with COVID-19. An individual without symptoms of COVID-19 and who is not shedding SARS-CoV-2 virus wo uld  expect to have a negative (not detected) result in this assay.    Influenza A, NAA Not Detected Not Detected Final   Influenza B, NAA Not Detected Not Detected Final   RSV, NAA Not Detected Not Detected Final  Blood culture (routine x 2)     Status: None (Preliminary result)   Collection Time: 05/25/20  7:38 AM   Specimen: BLOOD  Result Value Ref Range Status   Specimen Description BLOOD LEFT ANTECUBITAL  Final   Special Requests   Final    BOTTLES DRAWN AEROBIC AND ANAEROBIC Blood Culture adequate volume   Culture   Final    NO GROWTH 2 DAYS Performed at [facility], [address] [place], [place] [address]    Report Status PENDING  Incomplete  Blood culture (routine x 2)     Status: None (Preliminary result)   Collection Time: 05/25/20  9:33 AM   Specimen: BLOOD  Result Value Ref Range Status   Specimen Description BLOOD RIGHT ANTECUBITAL  Final   Special Requests   Final    BOTTLES DRAWN AEROBIC AND ANAEROBIC Blood Culture adequate volume   Culture   Final    NO GROWTH 2 DAYS Performed at [facility], [address] [place], [place] [address]    Report Status PENDING  Incomplete  Respiratory Panel by RT PCR (Flu A&B, Covid) - Nasopharyngeal Swab     Status: None   Collection Time: 05/25/20 10:06 AM   Specimen: Nasopharyngeal Swab  Result Value Ref Range Status   SARS Coronavirus 2 by RT PCR NEGATIVE NEGATIVE Final    Comment: (NOTE) SARS-CoV-2 target nucleic acids are NOT DETECTED.  The SARS-CoV-2 RNA is generally detectable in upper respiratoy specimens during the acute phase of infection. The lowest concentration of SARS-CoV-2 viral copies this assay can detect is 131 copies/mL. A negative result does not preclude SARS-Cov-2 infection and should not be used as the sole basis for treatment or other patient management decisions. A negative result may occur with  improper specimen collection/handling, submission of specimen other than nasopharyngeal  swab, presence of viral mutation(s) within the areas targeted by this assay, and inadequate number of viral copies (<131 copies/mL). A negative result must be combined with clinical observations, patient history, and epidemiological information. The expected result is Negative.  Fact Sheet for Patients:  https://www.moore.com/  Fact Sheet for Healthcare Providers:  https://www.young.biz/  This test is no t yet approved or cleared by the [place] FDA and  has been authorized for detection and/or diagnosis of SARS-CoV-2 by FDA under an Emergency Use Authorization (EUA). This EUA will remain  in effect (meaning this test can be used) for the duration of the COVID-19 declaration under Section 564(b)(1) of the Act, 21 U.S.C. section 360bbb-3(b)(1), unless the authorization is terminated or revoked sooner.     Influenza A by PCR NEGATIVE NEGATIVE Final   Influenza B by PCR NEGATIVE NEGATIVE Final    Comment: (NOTE) The Xpert Xpress SARS-CoV-2/FLU/RSV assay is intended as an aid in  the diagnosis of influenza from Nasopharyngeal swab specimens and  should not be used as a sole basis for treatment. Nasal washings and  aspirates are unacceptable for Xpert Xpress SARS-CoV-2/FLU/RSV  testing.  Fact Sheet for Patients: https://www.moore.com/  Fact Sheet for Healthcare Providers: https://www.young.biz/  This test is not yet approved or cleared by the [place] FDA and  has been authorized for detection and/or diagnosis of SARS-CoV-2 by  FDA under an Emergency Use Authorization (EUA). This EUA will remain  in effect (meaning this test can be used) for the duration of the  Covid-19 declaration under Section 564(b)(1) of the Act, 21  U.S.C. section 360bbb-3(b)(1), unless the authorization is  terminated or revoked. Performed at [facility], [address] [place], [place] [address]    Aerobic/Anaerobic Culture (surgical/deep wound)     Status: None (Preliminary result)   Collection Time: 05/25/20  2:17 PM   Specimen: PATH Other; Tissue  Result Value Ref Range Status   Specimen Description ABSCESS SCROTUM  Final   Special Requests PT ON CLINDAMYCIN  Final   Gram Stain   Final    RARE WBC PRESENT,BOTH PMN AND MONONUCLEAR ABUNDANT GRAM POSITIVE COCCI MODERATE GRAM NEGATIVE RODS FEW GRAM POSITIVE RODS    Culture   Final    NO GROWTH < 24 HOURS Performed at [facility], [address] [place], [place] [address]    Report Status PENDING  Incomplete         Radiology Studies: DG Abd 1 View  Result Date: 05/25/2020 CLINICAL DATA:  Surgery in the groin region. Concern regarding lost needle. EXAM: ABDOMEN - 1 VIEW COMPARISON:  CT earlier same day FINDINGS: No radiopaque foreign object in the region imaged. IMPRESSION: No radiopaque foreign object. Report called to operating room by myself. There was no answer to the ringing phone. Therefore, PRA call report process instituted. Electronically Signed   By: Paulina Fusi M.D.   On: 05/25/2020 15:57   CT ABDOMEN PELVIS W CONTRAST  Result Date: 05/25/2020 CLINICAL DATA:  Fever. Right pelvic cellulitis and abscess. EXAM: CT ABDOMEN AND PELVIS WITH CONTRAST TECHNIQUE: Multidetector CT imaging of the abdomen and pelvis was performed using the standard protocol following bolus administration of intravenous contrast. CONTRAST:  OMNIPAQUE IOHEXOL 300 MG/ML  SOLN COMPARISON:  01/03/2016 FINDINGS: Lower chest: Mild atelectasis and or scarring at the right lung base. Coronary artery calcification and/or stents. Hepatobiliary: Normal Pancreas: Normal Spleen: Normal Adrenals/Urinary Tract: Adrenal glands are normal. Kidneys are normal. No cyst, mass, stone or hydronephrosis. Bladder is normal. Stomach/Bowel: Normal. Vascular/Lymphatic: Aortic atherosclerosis. No aneurysm. IVC is normal. No retroperitoneal adenopathy.  Reproductive: Normal Other: Soft tissue infection in the right groin region with edema and soft tissue gas. No circumscribed abscess collection. Edema and gas extend into the proximal right  scrotal region. No intraperitoneal or retroperitoneal extension. Musculoskeletal: Ordinary lower lumbar degenerative changes. IMPRESSION: 1. Soft tissue infection in the right groin region with edema and soft tissue gas. No circumscribed abscess collection. Edema and gas extend into the proximal right scrotal region. No intraperitoneal or retroperitoneal extension. 2. Aortic atherosclerosis. Coronary artery calcification and/or stents. Aortic Atherosclerosis (ICD10-I70.0). Electronically Signed   By: Paulina Fusi M.D.   On: 05/25/2020 11:40        Scheduled Meds: . [MAR Hold] Chlorhexidine Gluconate Cloth  6 each Topical Daily  . [MAR Hold] enoxaparin (LOVENOX) injection  40 mg Subcutaneous Q24H  . [MAR Hold] ezetimibe  10 mg Oral Daily  . [MAR Hold] insulin aspart  0-15 Units Subcutaneous TID WC  . [MAR Hold] insulin aspart  0-5 Units Subcutaneous QHS  . [MAR Hold] metoprolol tartrate  25 mg Oral BID  . [MAR Hold] morphine   Intravenous Q4H  . [MAR Hold] mupirocin ointment  1 application Nasal BID  . [MAR Hold] rosuvastatin  40 mg Oral Daily   Continuous Infusions: . sodium chloride 125 mL/hr at 05/26/20 2000  . [MAR Hold] clindamycin (CLEOCIN) IV 600 mg (05/27/20 0200)  . lactated ringers    . [MAR Hold] piperacillin-tazobactam 3.375 g (05/27/20 0416)  . [MAR Hold] vancomycin 1,000 mg (05/26/20 2148)     LOS: 2 days    Time spent:25 mins. More than 50% of that time was spent in counseling and/or coordination of care.      Burnadette Pop, MD [facility] P11/09/2019, 8:33 AM

## 2020-05-27 NOTE — Transfer of Care (Signed)
Immediate Anesthesia Transfer of Care Note  Patient: Adam Haney  Procedure(s) Performed: IRRIGATION AND DEBRIDEMENT OF RIGHT GROIN WOUND (Right Groin) APPLICATION OF WOUND VAC (Right Groin)  Patient Location: PACU  Anesthesia Type:General  Level of Consciousness: drowsy and patient cooperative  Airway & Oxygen Therapy: Patient Spontanous Breathing  Post-op Assessment: Report given to RN and Post -op Vital signs reviewed and stable  Post vital signs: Reviewed and stable  Last Vitals:  Vitals Value Taken Time  BP 136/85   Temp    Pulse 77   Resp 17   SpO2 100     Last Pain:  Vitals:   05/27/20 0800  TempSrc:   PainSc: 3       Patients Stated Pain Goal: 3 (05/25/20 1655)  Complications: No complications documented.

## 2020-05-27 NOTE — Anesthesia Procedure Notes (Signed)
Procedure Name: Intubation Date/Time: 05/27/2020 9:19 AM Performed by: Modena Morrow, CRNA Pre-anesthesia Checklist: Patient identified, Emergency Drugs available, Suction available and Patient being monitored Patient Re-evaluated:Patient Re-evaluated prior to induction Oxygen Delivery Method: Circle system utilized Preoxygenation: Pre-oxygenation with 100% oxygen Induction Type: IV induction Ventilation: Mask ventilation without difficulty Laryngoscope Size: Miller and 3 Grade View: Grade I Tube type: Oral Tube size: 7.5 mm Number of attempts: 1 Airway Equipment and Method: Stylet and Oral airway Placement Confirmation: ETT inserted through vocal cords under direct vision,  positive ETCO2 and breath sounds checked- equal and bilateral Secured at: 23 cm Tube secured with: Tape Dental Injury: Teeth and Oropharynx as per pre-operative assessment

## 2020-05-27 NOTE — Op Note (Addendum)
° °  Operative Note   Date: 05/27/2020  Procedure: incision and debridement of right groin and scrotum, negative pressure wound vac application 34cmx15cmx9cm   Pre-op diagnosis: necrotizing soft tissue infection, second look Post-op diagnosis: same  Indication and clinical history: The patient is a 54 y.o. year old male with necrotizing soft tissue infection of the right groin s/p debridement.     Surgeon: Diamantina Monks, MD  Anesthesiologist: Donavan Foil, MD Anesthesia: General  Findings:   Specimen: none  EBL: 20cc  Drains/Implants: wound vac application  Disposition: PACU - hemodynamically stable.  Description of procedure: The patient was positioned supine on the operating room table. General anesthetic induction and intubation were uneventful. The patient was repositioned to lithotomy and prepped and draped in the usual steril fashion. Time-out was performed verifying correct patient, procedure, signature of informed consent, and administration of pre-operative antibiotics.   The wound was evaluated and minimal necrotic tissue was identified and debrided with electrocautery. There was additional erythema and induration laterally extending onto his back and the lateral edge of the wound was bluntly dissected, revealing an additional pocket of purulent material. The skin overlying this pocket was opened sharply. An additional pocket of purulent material was identified at the base of the scrotum. This was also opened sharply. A small amount of necrotic skin was noted at the base of the scrotum and this was debrided sharply. Next, a pulse lavage was used over the entirety of the wound. Hemostasis was achieved. A large vein was clearly visible and appeared to be the inferior epigastric vein. A 2-0 vicryl suture was used to approximate tissue over the vessel. A negative pressure wound dressing was applied with a white sponge overlying the approximated tissue overlying the inferior epigastric  vein. Some of the skin of the scrotum was re-approximated to allow improved dressing placement. The total wound size was 34cm x 15cm x 9cm.  Sterile dressings were applied. All sponge and instrument counts were correct at the conclusion of the procedure. The patient was awakened from anesthesia, extubated uneventfully, and transported to the PACU in good condition. There were no complications.   Clinical update attempted to patient's significant other, unable to reach, left VM.    Diamantina Monks, MD General and Trauma Surgery [facility]

## 2020-05-28 ENCOUNTER — Encounter (HOSPITAL_COMMUNITY): Payer: Self-pay | Admitting: Surgery

## 2020-05-28 ENCOUNTER — Inpatient Hospital Stay (HOSPITAL_COMMUNITY): Payer: 59

## 2020-05-28 DIAGNOSIS — M7989 Other specified soft tissue disorders: Secondary | ICD-10-CM | POA: Diagnosis not present

## 2020-05-28 LAB — CBC WITH DIFFERENTIAL/PLATELET
Abs Immature Granulocytes: 0.71 10*3/uL — ABNORMAL HIGH (ref 0.00–0.07)
Basophils Absolute: 0.1 10*3/uL (ref 0.0–0.1)
Basophils Relative: 0 %
Eosinophils Absolute: 0.1 10*3/uL (ref 0.0–0.5)
Eosinophils Relative: 0 %
HCT: 35.5 % — ABNORMAL LOW (ref 39.0–52.0)
Hemoglobin: 11.7 g/dL — ABNORMAL LOW (ref 13.0–17.0)
Immature Granulocytes: 4 %
Lymphocytes Relative: 16 %
Lymphs Abs: 2.7 10*3/uL (ref 0.7–4.0)
MCH: 30 pg (ref 26.0–34.0)
MCHC: 33 g/dL (ref 30.0–36.0)
MCV: 91 fL (ref 80.0–100.0)
Monocytes Absolute: 0.8 10*3/uL (ref 0.1–1.0)
Monocytes Relative: 5 %
Neutro Abs: 12.9 10*3/uL — ABNORMAL HIGH (ref 1.7–7.7)
Neutrophils Relative %: 75 %
Platelets: 246 10*3/uL (ref 150–400)
RBC: 3.9 MIL/uL — ABNORMAL LOW (ref 4.22–5.81)
RDW: 14 % (ref 11.5–15.5)
WBC: 16.5 10*3/uL — ABNORMAL HIGH (ref 4.0–10.5)
nRBC: 0 % (ref 0.0–0.2)

## 2020-05-28 LAB — GLUCOSE, CAPILLARY
Glucose-Capillary: 133 mg/dL — ABNORMAL HIGH (ref 70–99)
Glucose-Capillary: 139 mg/dL — ABNORMAL HIGH (ref 70–99)
Glucose-Capillary: 199 mg/dL — ABNORMAL HIGH (ref 70–99)
Glucose-Capillary: 142 mg/dL — ABNORMAL HIGH (ref 70–99)

## 2020-05-28 LAB — COMPREHENSIVE METABOLIC PANEL
ALT: 339 U/L — ABNORMAL HIGH (ref 0–44)
AST: 256 U/L — ABNORMAL HIGH (ref 15–41)
Albumin: 1.9 g/dL — ABNORMAL LOW (ref 3.5–5.0)
Alkaline Phosphatase: 109 U/L (ref 38–126)
Anion gap: 7 (ref 5–15)
BUN: 13 mg/dL (ref 6–20)
CO2: 29 mmol/L (ref 22–32)
Calcium: 7.8 mg/dL — ABNORMAL LOW (ref 8.9–10.3)
Chloride: 99 mmol/L (ref 98–111)
Creatinine, Ser: 0.81 mg/dL (ref 0.61–1.24)
GFR, Estimated: >60 mL/min (ref >60)
Glucose, Bld: 117 mg/dL — ABNORMAL HIGH (ref 70–99)
Potassium: 3.9 mmol/L (ref 3.5–5.1)
Sodium: 135 mmol/L (ref 135–145)
Total Bilirubin: 1.2 mg/dL (ref 0.3–1.2)
Total Protein: 5.9 g/dL — ABNORMAL LOW (ref 6.5–8.1)

## 2020-05-28 MED ORDER — ACETAMINOPHEN 325 MG PO TABS
650.0000 mg | ORAL_TABLET | Freq: Four times a day (QID) | ORAL | Status: DC
  Administered 2020-05-28 – 2020-06-02 (×20): 650 mg via ORAL
  Filled 2020-05-28 (×20): qty 2

## 2020-05-28 MED ORDER — OXYCODONE HCL 5 MG PO TABS
5.0000 mg | ORAL_TABLET | ORAL | Status: DC | PRN
  Administered 2020-05-28 – 2020-05-29 (×6): 10 mg via ORAL
  Administered 2020-05-30: 5 mg via ORAL
  Administered 2020-05-30 – 2020-06-01 (×8): 10 mg via ORAL
  Administered 2020-06-01: 5 mg via ORAL
  Administered 2020-06-01 – 2020-06-02 (×8): 10 mg via ORAL
  Filled 2020-05-28 (×15): qty 2
  Filled 2020-05-28: qty 1
  Filled 2020-05-28 (×8): qty 2

## 2020-05-28 MED ORDER — MORPHINE SULFATE (PF) 2 MG/ML IV SOLN
2.0000 mg | INTRAVENOUS | Status: DC | PRN
  Administered 2020-05-28 – 2020-05-31 (×9): 2 mg via INTRAVENOUS
  Filled 2020-05-28 (×9): qty 1

## 2020-05-28 MED ORDER — POLYETHYLENE GLYCOL 3350 17 G PO PACK
17.0000 g | PACK | Freq: Every day | ORAL | Status: DC
  Filled 2020-05-28 (×4): qty 1

## 2020-05-28 MED ORDER — METHOCARBAMOL 500 MG PO TABS
500.0000 mg | ORAL_TABLET | Freq: Four times a day (QID) | ORAL | Status: DC
  Administered 2020-05-28 – 2020-06-02 (×20): 500 mg via ORAL
  Filled 2020-05-28 (×20): qty 1

## 2020-05-28 MED ORDER — DOCUSATE SODIUM 100 MG PO CAPS
100.0000 mg | ORAL_CAPSULE | Freq: Two times a day (BID) | ORAL | Status: DC
  Administered 2020-05-28 – 2020-06-01 (×6): 100 mg via ORAL
  Filled 2020-05-28 (×9): qty 1

## 2020-05-28 MED ORDER — ACETAMINOPHEN 650 MG RE SUPP: 650.0000 mg | Freq: Four times a day (QID) | RECTAL | Status: DC

## 2020-05-28 MED FILL — Sodium Chloride IV Soln 0.9%: INTRAVENOUS | Qty: 50 | Status: AC

## 2020-05-28 MED FILL — Morphine Sulfate IV Soln PF 10 MG/ML: INTRAVENOUS | Qty: 50 | Status: AC

## 2020-05-28 NOTE — Progress Notes (Signed)
[facility] Progress Note  1 Day Post-Op  Subjective: CC-  Groin sore but pain fairly well controlled. Did not get out of bed yesterday. Tolerating diet. Denies abdominal pain. WBC down 16.5, afebrile.  Objective: Vital signs in last 24 hours: Temp:  [97.6 F (36.4 C)-98.6 F (37 C)] 98.2 F (36.8 C) (11/04 0729) Pulse Rate:  [66-73] 73 (11/04 0729) Resp:  [11-20] 17 (11/04 0729) BP: (105-136)/(54-85) 119/54 (11/04 0729) SpO2:  [95 %-99 %] 95 % (11/04 0729)    Intake/Output from previous day: 11/03 0701 - 11/04 0700 In: 1216.7 [I.V.:500; IV Piggyback:716.7] Out: 2925 [Urine:2900; Blood:25] Intake/Output this shift: No intake/output data recorded.  PE: Gen:  Alert, NAD, pleasant Pulm:  rate and effort normal Abd: Soft, NT/ND Skin: warm and dry GU: large right groin wound extending down into right hemiscrotum with wound vac in place with good seal, cellulitis improving  Lab Results:  Recent Labs    05/27/20 0149 05/28/20 0100  WBC 18.5* 16.5*  HGB 11.3* 11.7*  HCT 34.9* 35.5*  PLT 237 246   BMET Recent Labs    05/27/20 0149 05/28/20 0100  NA 134* 135  K 3.7 3.9  CL 97* 99  CO2 30 29  GLUCOSE 141* 117*  BUN 20 13  CREATININE 0.93 0.81  CALCIUM 7.6* 7.8*   PT/INR No results for input(s): LABPROT, INR in the last 72 hours. CMP     Component Value Date/Time   NA 135 05/28/2020 0100   K 3.9 05/28/2020 0100   CL 99 05/28/2020 0100   CO2 29 05/28/2020 0100   GLUCOSE 117 (H) 05/28/2020 0100   BUN 13 05/28/2020 0100   CREATININE 0.81 05/28/2020 0100   CALCIUM 7.8 (L) 05/28/2020 0100   PROT 5.9 (L) 05/28/2020 0100   PROT 6.6 08/07/2019 0844   ALBUMIN 1.9 (L) 05/28/2020 0100   ALBUMIN 4.2 08/07/2019 0844   AST 256 (H) 05/28/2020 0100   ALT 339 (H) 05/28/2020 0100   ALKPHOS 109 05/28/2020 0100   BILITOT 1.2 05/28/2020 0100   BILITOT 0.8 08/07/2019 0844   GFRNONAA >60 05/28/2020 0100   GFRAA >60 01/03/2016 1104   Lipase      Component Value Date/Time   LIPASE 18 04/21/2010 0750       Studies/Results: No results found.  Anti-infectives: Anti-infectives (From admission, onward)   Start     Dose/Rate Route Frequency Ordered Stop   05/25/20 2200  vancomycin (VANCOCIN) IVPB 1000 mg/200 mL premix        1,000 mg 200 mL/hr over 60 Minutes Intravenous Every 12 hours 05/25/20 1626     05/25/20 2000  clindamycin (CLEOCIN) IVPB 600 mg  Status:  Discontinued        600 mg 100 mL/hr over 30 Minutes Intravenous Every 6 hours 05/25/20 1838 05/28/20 0749   05/25/20 2000  piperacillin-tazobactam (ZOSYN) IVPB 3.375 g        3.375 g 12.5 mL/hr over 240 Minutes Intravenous Every 8 hours 05/25/20 1838     05/25/20 1245  clindamycin (CLEOCIN) IVPB 900 mg        900 mg 100 mL/hr over 30 Minutes Intravenous  Once 05/25/20 1235 05/25/20 1437   05/25/20 1015  vancomycin (VANCOREADY) IVPB 1750 mg/350 mL        1,750 mg 175 mL/hr over 120 Minutes Intravenous  Once 05/25/20 0925 05/25/20 1841   05/25/20 0930  piperacillin-tazobactam (ZOSYN) IVPB 3.375 g        3.375 g 100 mL/hr  over 30 Minutes Intravenous  Once 05/25/20 0925 05/25/20 1022       Assessment/Plan 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 Bedelia Person POD#2 S/p incision and debridement of right groin and scrotum, negative pressure wound vac application 34cmx15cmx9cm 11/3 Dr. Bedelia Person - GS with gram+ cocci, gram neg rods, gram+ rods, no anaerobes. Reincubated, report pending - path: no malignancy seen - D/c PCA and schedule tylenol and robaxin, add oxy 5-10mg  PRN with morphine for breakthrough pain. Needs to mobilize, will also ask PT to see. - wound vac MWF. Home health orders placed. Plan for first  vac change tomorrow. May be ready for discharge tomorrow.   ID - clinda 11/1>>11/4, zosyn/vancomycin 11/1>> FEN - d/c IVF, CM diet VTE - lovenox Foley - placed 11/1, d/c today once mobilizing Follow up - TBD   LOS: 3 days    Franne Forts, [facility] 05/28/2020, 9:05 AM Please see Amion for pager number during day hours 7:00am-4:30pm

## 2020-05-28 NOTE — Anesthesia Postprocedure Evaluation (Signed)
Anesthesia Post Note  Patient: Adam Haney  Procedure(s) Performed: IRRIGATION AND DEBRIDEMENT ABSCESS GROIN (Right Groin)     Patient location during evaluation: PACU Anesthesia Type: General Level of consciousness: awake and alert Pain management: pain level controlled Vital Signs Assessment: post-procedure vital signs reviewed and stable Respiratory status: spontaneous breathing, nonlabored ventilation, respiratory function stable and patient connected to nasal cannula oxygen Cardiovascular status: blood pressure returned to baseline and stable Postop Assessment: no apparent nausea or vomiting Anesthetic complications: no   No complications documented.  Last Vitals:  Vitals:   05/28/20 0341 05/28/20 0729  BP:  (!) 119/54  Pulse:  73  Resp: 15 17  Temp:  36.8 C  SpO2: 97% 95%    Last Pain:  Vitals:   05/28/20 0729  TempSrc: Oral  PainSc:                  [name] S

## 2020-05-28 NOTE — Progress Notes (Signed)
PROGRESS NOTE    Adam Haney  QQV:[id number] DOB: [birth year] DOA: 05/25/2020 PCP: [name], MD   Chief Complain: Fever, chills, groin pain  Brief Narrative:  Patient is a 54 year old male with history of diabetes type 2, coronary artery  disease status post PCI, obesity who presented with fever, chills and groin pain from home.  On presentation, CT showed soft tissue infection in the groin with edema and gas, elevated leukocytosis and he was febrile presentation.  Was suspected to have Fournier's gangrene and was seen by general surgery and was taken to the OR for I&D of the right groin and scrotum. Underwent another I&D on 05/27/20.  [facility] is consulting for medical assistance.  Assessment & Plan:   Principal Problem:   Necrotizing soft tissue infection Active Problems:   Tobacco abuse   Coronary atherosclerosis of native coronary artery   Hyperlipidemia   Hyperglycemia   Class 1 obesity due to excess calories with body mass index (BMI) of 32.0 to 32.9 in adult   Severe sepsis: Presented with fever leukocytosis, tachycardia.  Will follow blood cultures,NGTD.  Currently on vancomycin, Zosyn .We stopped  Clindamycin for concern of hepatotoxicity due to elevated liver enzymes.  Still has significant leukocytosis but is afebrile now.  Fournier's gangrene: Underwent I&D on 11/1 and 11/3.  General surgery managing.Wound culture showing  mixed organisms.  On PCA pump for pain control  Elevated liver enzymes: Viral hepatitis panel negative. Crestor and zetia discontinued. Normal CK.discontinued clindamycin. Continue to monitor liver enzymes, will get US Doppler and ultrasound of the liver.  Prediabetes with hyperglycemia: Elevated blood sugars.  Not on antihyperglycemic's at home.  Hemoglobin A1c of 6.3.  Glucose elevated due to sepsis.  Continue sliding-scale insulin.  Coronary artery disease: Currently stable.  Status post PCI artery.  On Crestor, Zetia and metoprolol at home.  Takes  Plavix at home which is on hold for surgery.         DVT prophylaxis:Lovenox Code Status: Full Family Communication: Discussed with wife at the bedside Status is: Inpatient    Procedures: I&D  Antimicrobials:  Anti-infectives (From admission, onward)   Start     Dose/Rate Route Frequency Ordered Stop   05/25/20 2200  vancomycin (VANCOCIN) IVPB 1000 mg/200 mL premix        1,000 mg 200 mL/hr over 60 Minutes Intravenous Every 12 hours 05/25/20 1626     05/25/20 2000  clindamycin (CLEOCIN) IVPB 600 mg        600 mg 100 mL/hr over 30 Minutes Intravenous Every 6 hours 05/25/20 1838     05/25/20 2000  piperacillin-tazobactam (ZOSYN) IVPB 3.375 g        3.375 g 12.5 mL/hr over 240 Minutes Intravenous Every 8 hours 05/25/20 1838     05/25/20 1245  clindamycin (CLEOCIN) IVPB 900 mg        900 mg 100 mL/hr over 30 Minutes Intravenous  Once 05/25/20 1235 05/25/20 1437   05/25/20 1015  vancomycin (VANCOREADY) IVPB 1750 mg/350 mL        1,750 mg 175 mL/hr over 120 Minutes Intravenous  Once 05/25/20 0925 05/25/20 1841   05/25/20 0930  piperacillin-tazobactam (ZOSYN) IVPB 3.375 g        3.375 g 100 mL/hr over 30 Minutes Intravenous  Once 05/25/20 0925 05/25/20 1022      Subjective:  Patient seen and examined at the bedside this morning.  Pain better controlled than yesterday.  No new complaints.  Objective: Vitals:  05/28/20 0048 05/28/20 0314 05/28/20 0341 05/28/20 0729  BP: 105/70 116/68  (!) 119/54  Pulse:    73  Resp: 17 15 15 17   Temp: 98 F (36.7 C) 98.1 F (36.7 C)  98.2 F (36.8 C)  TempSrc: Oral Oral  Oral  SpO2: 99% 96% 97% 95%  Weight:      Height:        Intake/Output Summary (Last 24 hours) at 05/28/2020 0747 Last data filed at 05/28/2020 13/10/2019 Gross per 24 hour  Intake 1216.67 ml  Output 2925 ml  Net -1708.33 ml   Filed Weights   05/25/20 1255 05/27/20 0835  Weight: 114.3 kg 114.3 kg    Examination:  General exam: Not in distress,  obese Respiratory system: Bilateral equal air entry, normal vesicular breath sounds, no wheezes or crackles  Cardiovascular system: S1 & S2 heard, RRR. No JVD, murmurs, rubs, gallops or clicks. Gastrointestinal system: Abdomen is nondistended, soft and nontender. No organomegaly or masses felt. Normal bowel sounds heard. Central nervous system: Alert and oriented. No focal neurological deficits. Extremities: No edema, no clubbing ,no cyanosis, distal peripheral pulses palpable. Skin: Noicterus ,no pallor MSK/GU: Right groin wound with wound VAC, Foley   Data Reviewed: I have personally reviewed following labs and imaging studies  CBC: Recent Labs  Lab 05/25/20 0357 05/26/20 0602 05/27/20 0149 05/28/20 0100  WBC 23.3* 20.7* 18.5* 16.5*  NEUTROABS  --   --   --  12.9*  HGB 13.9 12.4* 11.3* 11.7*  HCT 41.1 37.7* 34.9* 35.5*  MCV 89.9 90.0 91.1 91.0  PLT 270 219 237 246   Basic Metabolic Panel: Recent Labs  Lab 05/25/20 0357 05/26/20 0602 05/27/20 0149 05/28/20 0100  NA 132* 134* 134* 135  K 3.5 4.2 3.7 3.9  CL 98 101 97* 99  CO2 20* 26 30 29   GLUCOSE 219* 247* 141* 117*  BUN 19 18 20 13   CREATININE 0.85 0.90 0.93 0.81  CALCIUM 8.6* 7.7* 7.6* 7.8*   GFR: Estimated Creatinine Clearance: 141.7 mL/min (by C-G formula based on SCr of 0.81 mg/dL). Liver Function Tests: Recent Labs  Lab 05/25/20 0357 05/26/20 0602 05/27/20 0149 05/28/20 0100  AST 200* 72* 180* 256*  ALT 190* 151* 215* 339*  ALKPHOS 135* 95 91 109  BILITOT 1.0 0.9 0.8 1.2  PROT 7.0 5.9* 5.5* 5.9*  ALBUMIN 2.5* 2.0* 1.7* 1.9*   No results for input(s): LIPASE, AMYLASE in the last 168 hours. No results for input(s): AMMONIA in the last 168 hours. Coagulation Profile: No results for input(s): INR, PROTIME in the last 168 hours. Cardiac Enzymes: Recent Labs  Lab 05/27/20 1148  CKTOTAL 50   BNP (last 3 results) No results for input(s): PROBNP in the last 8760 hours. HbA1C: Recent Labs     05/26/20 0602  HGBA1C 6.3*   CBG: Recent Labs  Lab 05/27/20 1032 05/27/20 1154 05/27/20 1603 05/27/20 2128 05/28/20 0726  GLUCAP 87 104* 236* 121* 133*   Lipid Profile: No results for input(s): CHOL, HDL, LDLCALC, TRIG, CHOLHDL, LDLDIRECT in the last 72 hours. Thyroid Function Tests: No results for input(s): TSH, T4TOTAL, FREET4, T3FREE, THYROIDAB in the last 72 hours. Anemia Panel: No results for input(s): VITAMINB12, FOLATE, FERRITIN, TIBC, IRON, RETICCTPCT in the last 72 hours. Sepsis Labs: Recent Labs  Lab 05/25/20 0357 05/25/20 0933  LATICACIDVEN 1.8 1.6    Recent Results (from the past 240 hour(s))  COVID-19, Flu A+B and RSV ([facility])     Status: None   Collection  Time: 05/20/20 10:20 AM  Result Value Ref Range Status   SARS-CoV-2, NAA Not Detected Not Detected Final    Comment: This nucleic acid amplification test was developed and its performance characteristics determined by [employer]. Nucleic acid amplification tests include RT-PCR and TMA. This test has not been FDA cleared or approved. This test has been authorized by FDA under an Emergency Use Authorization (EUA). This test is only authorized for the duration of time the declaration that circumstances exist justifying the authorization of the emergency use of in vitro diagnostic tests for detection of SARS-CoV-2 virus and/or diagnosis of COVID-19 infection under section 564(b)(1) of the Act, 21 U.S.C. 546TKP-5(W) (1), unless the authorization is terminated or revoked sooner. When diagnostic testing is negative, the possibility of a false negative result should be considered in the context of a patient's recent exposures and the presence of clinical signs and symptoms consistent with COVID-19. An individual without symptoms of COVID-19 and who is not shedding SARS-CoV-2 virus wo uld expect to have a negative (not detected) result in this assay.    Influenza A, NAA Not Detected Not Detected  Final   Influenza B, NAA Not Detected Not Detected Final   RSV, NAA Not Detected Not Detected Final  Blood culture (routine x 2)     Status: None (Preliminary result)   Collection Time: 05/25/20  7:38 AM   Specimen: BLOOD  Result Value Ref Range Status   Specimen Description BLOOD LEFT ANTECUBITAL  Final   Special Requests   Final    BOTTLES DRAWN AEROBIC AND ANAEROBIC Blood Culture adequate volume   Culture   Final    NO GROWTH 3 DAYS Performed at [facility], [address] [place], [place] [address]    Report Status PENDING  Incomplete  Blood culture (routine x 2)     Status: None (Preliminary result)   Collection Time: 05/25/20  9:33 AM   Specimen: BLOOD  Result Value Ref Range Status   Specimen Description BLOOD RIGHT ANTECUBITAL  Final   Special Requests   Final    BOTTLES DRAWN AEROBIC AND ANAEROBIC Blood Culture adequate volume   Culture   Final    NO GROWTH 3 DAYS Performed at [facility], [address] [place], [place] [address]    Report Status PENDING  Incomplete  Respiratory Panel by RT PCR (Flu A&B, Covid) - Nasopharyngeal Swab     Status: None   Collection Time: 05/25/20 10:06 AM   Specimen: Nasopharyngeal Swab  Result Value Ref Range Status   SARS Coronavirus 2 by RT PCR NEGATIVE NEGATIVE Final    Comment: (NOTE) SARS-CoV-2 target nucleic acids are NOT DETECTED.  The SARS-CoV-2 RNA is generally detectable in upper respiratoy specimens during the acute phase of infection. The lowest concentration of SARS-CoV-2 viral copies this assay can detect is 131 copies/mL. A negative result does not preclude SARS-Cov-2 infection and should not be used as the sole basis for treatment or other patient management decisions. A negative result may occur with  improper specimen collection/handling, submission of specimen other than nasopharyngeal swab, presence of viral mutation(s) within the areas targeted by this assay, and inadequate number of viral  copies (<131 copies/mL). A negative result must be combined with clinical observations, patient history, and epidemiological information. The expected result is Negative.  Fact Sheet for Patients:  https://www.moore.com/  Fact Sheet for Healthcare Providers:  https://www.young.biz/  This test is no t yet approved or cleared by the [place]  [place] FDA and  has been authorized for detection and/or diagnosis of SARS-CoV-2 by FDA under an Emergency Use Authorization (EUA). This EUA will remain  in effect (meaning this test can be used) for the duration of the COVID-19 declaration under Section 564(b)(1) of the Act, 21 U.S.C. section 360bbb-3(b)(1), unless the authorization is terminated or revoked sooner.     Influenza A by PCR NEGATIVE NEGATIVE Final   Influenza B by PCR NEGATIVE NEGATIVE Final    Comment: (NOTE) The Xpert Xpress SARS-CoV-2/FLU/RSV assay is intended as an aid in  the diagnosis of influenza from Nasopharyngeal swab specimens and  should not be used as a sole basis for treatment. Nasal washings and  aspirates are unacceptable for Xpert Xpress SARS-CoV-2/FLU/RSV  testing.  Fact Sheet for Patients: https://www.moore.com/https://www.fda.gov/media/142436/download  Fact Sheet for Healthcare Providers: https://www.young.biz/https://www.fda.gov/media/142435/download  This test is not yet approved or cleared by the [place] FDA and  has been authorized for detection and/or diagnosis of SARS-CoV-2 by  FDA under an Emergency Use Authorization (EUA). This EUA will remain  in effect (meaning this test can be used) for the duration of the  Covid-19 declaration under Section 564(b)(1) of the Act, 21  U.S.C. section 360bbb-3(b)(1), unless the authorization is  terminated or revoked. Performed at [facility], [address] [place], [place] [address]   Aerobic/Anaerobic Culture (surgical/deep wound)     Status: None (Preliminary result)   Collection Time: 05/25/20  2:17  PM   Specimen: PATH Other; Tissue  Result Value Ref Range Status   Specimen Description ABSCESS SCROTUM  Final   Special Requests PT ON CLINDAMYCIN  Final   Gram Stain   Final    RARE WBC PRESENT,BOTH PMN AND MONONUCLEAR ABUNDANT GRAM POSITIVE COCCI MODERATE GRAM NEGATIVE RODS FEW GRAM POSITIVE RODS Performed at [facility], [address] [place], [place] [address]    Culture   Final    CULTURE REINCUBATED FOR BETTER GROWTH NO ANAEROBES ISOLATED; CULTURE IN PROGRESS FOR 5 DAYS    Report Status PENDING  Incomplete         Radiology Studies: No results found.      Scheduled Meds: . Chlorhexidine Gluconate Cloth  6 each Topical Daily  . insulin aspart  0-15 Units Subcutaneous TID WC  . insulin aspart  0-5 Units Subcutaneous QHS  . metoprolol tartrate  25 mg Oral BID  . morphine   Intravenous Q4H  . mupirocin ointment  1 application Nasal BID   Continuous Infusions: . sodium chloride 125 mL/hr at 05/26/20 2000  . clindamycin (CLEOCIN) IV 600 mg (05/28/20 0600)  . piperacillin-tazobactam 3.375 g (05/28/20 0400)  . vancomycin 1,000 mg (05/27/20 2158)     LOS: 3 days    Time spent:25 mins. More than 50% of that time was spent in counseling and/or coordination of care.      Burnadette PopAmrit Issa Kosmicki, MD [facility] P11/10/2019, 7:47 AM

## 2020-05-28 NOTE — Consult Note (Signed)
[facility]    Date of Admission:  05/25/2020     Total days of antibiotics 4  Vancomycin + Zosyn               Reason for Consult: necrotizing soft tissue infection with actinomyces     Referring Provider: [facility]CS Primary Care Provider: [name], MD     Assessment:  Adam Haney is a 54 y.o. male admitted with rapidly progressing necrotizing skin and soft tissue infection of the right groin. Polymicrobial on gram stain with gram positive cocci, gram negative rods and gram positive rods. Only pathogen growing on culture has been few actinomyces species.    He was not available for full exam but spoke in depth with wife and surgery team as well. Planning wound check tomorrow to ensure no further need for debridement for aggressive surgical treatment.  Being he is afebrile and downward trending WBCs I think he is OK to start with oral therapy tomorrow.   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 While not likely the primary driving pathogen, it is necessary to treat a minimum of 3 months to help prevent recurrence of infections known with this organism.      Plan:  1. Plan for oral doxy + augmentin starting tomorrow for 7 days  2. Follow up in ID to continue treatment for actinomyces specifically.       Principal Problem:   Necrotizing soft tissue infection Active Problems:   Tobacco abuse   Coronary atherosclerosis of native coronary artery   Hyperlipidemia   Hyperglycemia   Class 1 obesity due to excess calories with body mass index (BMI) of 32.0 to 32.9 in adult   . acetaminophen  650 mg Oral Q6H   Or  . acetaminophen  650 mg Rectal Q6H  . Chlorhexidine Gluconate Cloth  6 each Topical Daily  . insulin aspart  0-15 Units Subcutaneous TID WC  . insulin aspart  0-5 Units Subcutaneous QHS  . methocarbamol  500 mg Oral Q6H  . metoprolol tartrate  25 mg Oral BID  . mupirocin ointment  1 application Nasal BID     HPI: Adam Haney is a 54 y.o. male admitted from home with groin pain.   Came to ER on 10/28 with viral like syndrome (cough, chills, body aches) that had started Wednesday 10/27. COVID testing was negative. Came back to ER on 11/01 early in the morning with pretty sudden onset rash and swelling to his right groin and testicle. On exam with significant erythema, warmth and induration extending from lateral hip across to left lateral groin and testicle. Leukocytosis 23,000, CT scan concerning for gas-forming infection. Surgical consultation was called and Clindamycin added to vancomycin/zosyn. Taken immediately to OR for debridement of right groin 11/01 with Dr. Bedelia Person with second look on 11/03; during second surgery there was additional erythema and induration extending laterally into his back and lateral edge of the wound with an additional pocket of purulent material at the base of scrotum. Wound vac applied.    He was off the floor for testing while I went to see him. Wife at the bedside stated this came up so very rapidly for him. He does shave pubic hair and sometimes showers 2-3 times a day.    Review of Systems: ROS patient not available for interview. D/W Wife   Past Medical History:  Diagnosis Date  . Coronary artery disease  a. 10/2012 Inf STEMI/Cath/PCI: LM 20p, LAD 80-90p, 49m/d, D1 sm 99d, LCX mild plaque, RCA 100d (2.75x16 Promus Premier DES into PDA, 3.25x20 Promus Prem DES dRCA, 3.5x12 Promus Prem DES mRCA), EF 55-60%;  b. 10/2012 Staged PCI of LAD: 3.5x12 Promus Prem DES.  [name] Hyperlipidemia   . Leg pain    ABIs 3/16: Normal  . Nephrolithiasis    "passed on it's own" (16-Dec-2012)  . NSVT (nonsustained ventricular tachycardia) (HCC)    a. in setting of MI 10/2012 - Echo: EF 55%, mild LVH.  . Osgood-Schlatter's disease [phone]   "right knee" (12/16/2012)    Social History   Tobacco Use  . Smoking status: Former Smoker    Packs/day: 0.50    Years: 18.00    Pack years:  9.00    Types: Cigarettes  . Smokeless tobacco: Never Used  . Tobacco comment: 12/16/12 offered smoking sessation materials; pt declines  Vaping Use  . Vaping Use: Never used  Substance Use Topics  . Alcohol use: No  . Drug use: No    Family History  Problem Relation Age of Onset  . CAD Mother    No Known Allergies  OBJECTIVE: Blood pressure 107/61, pulse 65, temperature 98.2 F (36.8 C), temperature source Oral, resp. rate 13, height 6' 2.02" (1.88 m), weight 114.3 kg, SpO2 90 %.  Physical Exam Not available for exam   Lab Results Lab Results  Component Value Date   WBC 16.5 (H) 05/28/2020   HGB 11.7 (L) 05/28/2020   HCT 35.5 (L) 05/28/2020   MCV 91.0 05/28/2020   PLT 246 05/28/2020    Lab Results  Component Value Date   CREATININE 0.81 05/28/2020   BUN 13 05/28/2020   NA 135 05/28/2020   K 3.9 05/28/2020   CL 99 05/28/2020   CO2 29 05/28/2020    Lab Results  Component Value Date   ALT 339 (H) 05/28/2020   AST 256 (H) 05/28/2020   ALKPHOS 109 05/28/2020   BILITOT 1.2 05/28/2020     Microbiology: Recent Results (from the past 240 hour(s))  COVID-19, Flu A+B and RSV ([facility])     Status: None   Collection Time: 05/20/20 10:20 AM  Result Value Ref Range Status   SARS-CoV-2, NAA Not Detected Not Detected Final    Comment: This nucleic acid amplification test was developed and its performance characteristics determined by [employer]. Nucleic acid amplification tests include RT-PCR and TMA. This test has not been FDA cleared or approved. This test has been authorized by FDA under an Emergency Use Authorization (EUA). This test is only authorized for the duration of time the declaration that circumstances exist justifying the authorization of the emergency use of in vitro diagnostic tests for detection of SARS-CoV-2 virus and/or diagnosis of COVID-19 infection under section 564(b)(1) of the Act, 21 U.S.C. 703JKK-9(F) (1), unless the authorization  is terminated or revoked sooner. When diagnostic testing is negative, the possibility of a false negative result should be considered in the context of a patient's recent exposures and the presence of clinical signs and symptoms consistent with COVID-19. An individual without symptoms of COVID-19 and who is not shedding SARS-CoV-2 virus wo uld expect to have a negative (not detected) result in this assay.    Influenza A, NAA Not Detected Not Detected Final   Influenza B, NAA Not Detected Not Detected Final   RSV, NAA Not Detected Not Detected Final  Blood culture (routine x 2)     Status: None (Preliminary  result)   Collection Time: 05/25/20  7:38 AM   Specimen: BLOOD  Result Value Ref Range Status   Specimen Description BLOOD LEFT ANTECUBITAL  Final   Special Requests   Final    BOTTLES DRAWN AEROBIC AND ANAEROBIC Blood Culture adequate volume   Culture   Final    NO GROWTH 3 DAYS Performed at [facility], [address] [place], [place] [address]    Report Status PENDING  Incomplete  Blood culture (routine x 2)     Status: None (Preliminary result)   Collection Time: 05/25/20  9:33 AM   Specimen: BLOOD  Result Value Ref Range Status   Specimen Description BLOOD RIGHT ANTECUBITAL  Final   Special Requests   Final    BOTTLES DRAWN AEROBIC AND ANAEROBIC Blood Culture adequate volume   Culture   Final    NO GROWTH 3 DAYS Performed at [facility], [address], [place], [place] [address]    Report Status PENDING  Incomplete  Respiratory Panel by RT PCR (Flu A&B, Covid) - Nasopharyngeal Swab     Status: None   Collection Time: 05/25/20 10:06 AM   Specimen: Nasopharyngeal Swab  Result Value Ref Range Status   SARS Coronavirus 2 by RT PCR NEGATIVE NEGATIVE Final    Comment: (NOTE) SARS-CoV-2 target nucleic acids are NOT DETECTED.  The SARS-CoV-2 RNA is generally detectable in upper respiratoy specimens during the acute phase of infection. The  lowest concentration of SARS-CoV-2 viral copies this assay can detect is 131 copies/mL. A negative result does not preclude SARS-Cov-2 infection and should not be used as the sole basis for treatment or other patient management decisions. A negative result may occur with  improper specimen collection/handling, submission of specimen other than nasopharyngeal swab, presence of viral mutation(s) within the areas targeted by this assay, and inadequate number of viral copies (<131 copies/mL). A negative result must be combined with clinical observations, patient history, and epidemiological information. The expected result is Negative.  Fact Sheet for Patients:  https://www.moore.com/  Fact Sheet for Healthcare Providers:  https://www.young.biz/  This test is no t yet approved or cleared by the [place] FDA and  has been authorized for detection and/or diagnosis of SARS-CoV-2 by FDA under an Emergency Use Authorization (EUA). This EUA will remain  in effect (meaning this test can be used) for the duration of the COVID-19 declaration under Section 564(b)(1) of the Act, 21 U.S.C. section 360bbb-3(b)(1), unless the authorization is terminated or revoked sooner.     Influenza A by PCR NEGATIVE NEGATIVE Final   Influenza B by PCR NEGATIVE NEGATIVE Final    Comment: (NOTE) The Xpert Xpress SARS-CoV-2/FLU/RSV assay is intended as an aid in  the diagnosis of influenza from Nasopharyngeal swab specimens and  should not be used as a sole basis for treatment. Nasal washings and  aspirates are unacceptable for Xpert Xpress SARS-CoV-2/FLU/RSV  testing.  Fact Sheet for Patients: https://www.moore.com/  Fact Sheet for Healthcare Providers: https://www.young.biz/  This test is not yet approved or cleared by the [place] FDA and  has been authorized for detection and/or diagnosis of SARS-CoV-2 by  FDA under  an Emergency Use Authorization (EUA). This EUA will remain  in effect (meaning this test can be used) for the duration of the  Covid-19 declaration under Section 564(b)(1) of the Act, 21  U.S.C. section 360bbb-3(b)(1), unless the authorization is  terminated or revoked. Performed at [facility], [address],  [place], [place] [address]   Aerobic/Anaerobic Culture (surgical/deep wound)     Status: None (Preliminary result)   Collection Time: 05/25/20  2:17 PM   Specimen: PATH Other; Tissue  Result Value Ref Range Status   Specimen Description ABSCESS SCROTUM  Final   Special Requests PT ON CLINDAMYCIN  Final   Gram Stain   Final    RARE WBC PRESENT,BOTH PMN AND MONONUCLEAR ABUNDANT GRAM POSITIVE COCCI MODERATE GRAM NEGATIVE RODS FEW GRAM POSITIVE RODS Performed at [facility], [address] [place], [place] [address]    Culture   Final    FEW ACTINOMYCES SPECIES NO ANAEROBES ISOLATED; CULTURE IN PROGRESS FOR 5 DAYS    Report Status PENDING  Incomplete     Rexene AlbertsStephanie Zackory Pudlo, MSN, NP-C [facility]  [email or web address] Pager: [phone] Office: [phone] [facility] Main Line: [phone]

## 2020-05-28 NOTE — Progress Notes (Signed)
58mL of Morphine (concentration per syringe 2mg /mL) wasted from PCA with , RN

## 2020-05-28 NOTE — Progress Notes (Addendum)
Pharmacy Antibiotic Note  Adam Haney is a 54 y.o. male admitted on 05/25/2020 with necrotizing soft tissue infection.  Pharmacy has been consulted for vancomycin dosing. Also on Zosyn. Clindamycin d/c'd 11/3. SCr 0.81 stable.  Underwent I&D of R groin/scrotum on 11/1 and 11/3. Wound culture growing mixed organisms.  Plan: Continue vancomycin 1g IV q12h for now Zosyn 3.375g IV q8h (4h infusion) Monitor renal fx, cx results, check VT as appropriate (missed dose 11/3 AM during procedure)  Height: 6' 2.02" (188 cm) Weight: 114.3 kg (252 lb) IBW/kg (Calculated) : 82.24  Temp (24hrs), Avg:98.1 F (36.7 C), Min:97.6 F (36.4 C), Max:98.6 F (37 C)  Recent Labs  Lab 05/25/20 0357 05/25/20 0933 05/26/20 0602 05/27/20 0149 05/28/20 0100  WBC 23.3*  --  20.7* 18.5* 16.5*  CREATININE 0.85  --  0.90 0.93 0.81  LATICACIDVEN 1.8 1.6  --   --   --     Estimated Creatinine Clearance: 141.7 mL/min (by C-G formula based on SCr of 0.81 mg/dL).    No Known Allergies  Antimicrobials this admission: Vancomycin 11/1 >>  Zosyn 11/1 >>  Clindamycin 11/1>>11/3  Dose adjustments this admission: N/A  Microbiology results: 11/1 BCx: ngtd 11/1 Scrotum wound cx: abd GPC, mod GNR, few GPR; no anaerobes   Leia Alf, PharmD, BCPS Please check AMION for all [facility] contact numbers Clinical Pharmacist 05/28/2020 8:17 AM

## 2020-05-28 NOTE — Evaluation (Signed)
Physical Therapy Evaluation Patient Details Name: Adam Haney MRN: [id number] DOB: [birth year] Today's Date: 05/28/2020   History of Present Illness  Pt is a 54 y/o male admitted secondary to R goin infection. Pt is s/p I and D of R groin and scrotum on 11/1 and s/p wound vac placement on 11/3. PMH includes tobacco use, CAD s/p stent, and lumbar surgery.   Clinical Impression  Pt admitted secondary to problem above with deficits below. Pt limited this session secondary to pain in R groin. Was able to stand with min guard A and practiced weightshifting using RW. Anticipate pt will progress well once pain controlled. Reports wife will be able to assist at d/c. Will continue to follow acutely.     Follow Up Recommendations Home health PT;Supervision for mobility/OOB (HHPT pending progression )    Equipment Recommendations  Rolling walker with 5" wheels;3in1 (PT)    Recommendations for Other Services       Precautions / Restrictions Precautions Precautions: Fall Restrictions Weight Bearing Restrictions: No      Mobility  Bed Mobility Overal bed mobility: Needs Assistance Bed Mobility: Supine to Sit;Sit to Supine     Supine to sit: Min assist Sit to supine: Min assist   General bed mobility comments: Min A for LE and trunk assist.     Transfers Overall transfer level: Needs assistance Equipment used: Rolling walker (2 wheeled) Transfers: Sit to/from Stand Sit to Stand: Min guard         General transfer comment: Min guard for safety from elevated bed height. Pt pulling up on RW and PT braced RW. Pt practiced weight shifting in standing, however, reports too much pain to perform further mobility.   Ambulation/Gait                Stairs            Wheelchair Mobility    Modified Rankin (Stroke Patients Only)       Balance Overall balance assessment: Needs assistance Sitting-balance support: Feet supported;Bilateral upper extremity  supported Sitting balance-Leahy Scale: Fair Sitting balance - Comments: Bracing using BUE secondary to pain and discomfort in sitting.    Standing balance support: Bilateral upper extremity supported;During functional activity Standing balance-Leahy Scale: Poor Standing balance comment: Reliant on BUE support on RW                             Pertinent Vitals/Pain Pain Assessment: 0-10 Pain Score: 8  Pain Location: R groin area Pain Descriptors / Indicators: Aching Pain Intervention(s): Limited activity within patient's tolerance;Monitored during session;Repositioned    Home Living Family/patient expects to be discharged to:: Private residence Living Arrangements: Spouse/significant other Available Help at Discharge: Family;Available 24 hours/day (until monday) Type of Home: House Home Access: Stairs to enter Entrance Stairs-Rails: Right;Left;Can reach both Entrance Stairs-Number of Steps: 5 Home Layout: One level Home Equipment: None      Prior Function Level of Independence: Independent               Hand Dominance        Extremity/Trunk Assessment   Upper Extremity Assessment Upper Extremity Assessment: Defer to OT evaluation    Lower Extremity Assessment Lower Extremity Assessment: RLE deficits/detail;Generalized weakness RLE Deficits / Details: Decreased ROM secondary to pain in R groin.     Cervical / Trunk Assessment Cervical / Trunk Assessment: Normal  Communication   Communication: No difficulties  Cognition Arousal/Alertness: Awake/alert Behavior  During Therapy: WFL for tasks assessed/performed Overall Cognitive Status: Within Functional Limits for tasks assessed                                        General Comments General comments (skin integrity, edema, etc.): Pt's wife present throughout session     Exercises     Assessment/Plan    PT Assessment Patient needs continued PT services  PT Problem List  Decreased strength;Decreased activity tolerance;Decreased mobility;Decreased knowledge of use of DME       PT Treatment Interventions DME instruction;Gait training;Functional mobility training;Therapeutic activities;Therapeutic exercise;Stair training;Balance training;Patient/family education    PT Goals (Current goals can be found in the Care Plan section)  Acute Rehab PT Goals Patient Stated Goal: to decrease pain  PT Goal Formulation: With patient Time For Goal Achievement: 06/11/20 Potential to Achieve Goals: Good    Frequency Min 3X/week   Barriers to discharge        Co-evaluation               AM-PAC PT "6 Clicks" Mobility  Outcome Measure Help needed turning from your back to your side while in a flat bed without using bedrails?: A Little Help needed moving from lying on your back to sitting on the side of a flat bed without using bedrails?: A Little Help needed moving to and from a bed to a chair (including a wheelchair)?: A Little Help needed standing up from a chair using your arms (e.g., wheelchair or bedside chair)?: A Little Help needed to walk in hospital room?: A Lot Help needed climbing 3-5 steps with a railing? : A Lot 6 Click Score: 16    End of Session   Activity Tolerance: Patient limited by pain Patient left: in bed;with call bell/phone within reach;with nursing/sitter in room Nurse Communication: Mobility status PT Visit Diagnosis: Muscle weakness (generalized) (M62.81);Difficulty in walking, not elsewhere classified (R26.2);Pain Pain - Right/Left: Right Pain - part of body:  (groin)    Time: 5427-0623 PT Time Calculation (min) (ACUTE ONLY): 23 min   Charges:   PT Evaluation $PT Eval Moderate Complexity: 1 Mod PT Treatments $Therapeutic Activity: 8-22 mins        Cindee Salt, DPT  Acute Rehabilitation Services  Pager: [phone] Office: [phone]   Lehman Prom 05/28/2020, 12:03 PM

## 2020-05-28 NOTE — Plan of Care (Signed)
A/Ox4, LCTA. PCA d/c this am and has x1 PRN dose oxy so far this shift. LBM 10/31 - notified service of this. Reports passing gas and no abd distention or pain noted. Foley removed and due to void. Has been NPO since breakfast today for abd U/S.    Problem: Education: Goal: Knowledge of General Education information will improve Description: Including pain rating scale, medication(s)/side effects and non-pharmacologic comfort measures Outcome: Progressing   Problem: Health Behavior/Discharge Planning: Goal: Ability to manage health-related needs will improve Outcome: Progressing   Problem: Clinical Measurements: Goal: Ability to maintain clinical measurements within normal limits will improve Outcome: Progressing Goal: Will remain free from infection Outcome: Progressing Goal: Diagnostic test results will improve Outcome: Progressing Goal: Respiratory complications will improve Outcome: Progressing Goal: Cardiovascular complication will be avoided Outcome: Progressing   Problem: Activity: Goal: Risk for activity intolerance will decrease Outcome: Progressing   Problem: Nutrition: Goal: Adequate nutrition will be maintained Outcome: Progressing   Problem: Coping: Goal: Level of anxiety will decrease Outcome: Progressing   Problem: Elimination: Goal: Will not experience complications related to bowel motility Outcome: Progressing Goal: Will not experience complications related to urinary retention Outcome: Progressing   Problem: Pain Managment: Goal: General experience of comfort will improve Outcome: Progressing   Problem: Safety: Goal: Ability to remain free from injury will improve Outcome: Progressing   Problem: Skin Integrity: Goal: Risk for impaired skin integrity will decrease Outcome: Progressing

## 2020-05-29 DIAGNOSIS — M7989 Other specified soft tissue disorders: Secondary | ICD-10-CM | POA: Diagnosis not present

## 2020-05-29 LAB — COMPREHENSIVE METABOLIC PANEL
ALT: 298 U/L — ABNORMAL HIGH (ref 0–44)
AST: 156 U/L — ABNORMAL HIGH (ref 15–41)
Albumin: 1.9 g/dL — ABNORMAL LOW (ref 3.5–5.0)
Alkaline Phosphatase: 120 U/L (ref 38–126)
Anion gap: 7 (ref 5–15)
BUN: 11 mg/dL (ref 6–20)
CO2: 29 mmol/L (ref 22–32)
Calcium: 8 mg/dL — ABNORMAL LOW (ref 8.9–10.3)
Chloride: 96 mmol/L — ABNORMAL LOW (ref 98–111)
Creatinine, Ser: 0.86 mg/dL (ref 0.61–1.24)
GFR, Estimated: >60 mL/min (ref >60)
Glucose, Bld: 138 mg/dL — ABNORMAL HIGH (ref 70–99)
Potassium: 3.6 mmol/L (ref 3.5–5.1)
Sodium: 132 mmol/L — ABNORMAL LOW (ref 135–145)
Total Bilirubin: 1.4 mg/dL — ABNORMAL HIGH (ref 0.3–1.2)
Total Protein: 6.6 g/dL (ref 6.5–8.1)

## 2020-05-29 LAB — CBC WITH DIFFERENTIAL/PLATELET
Abs Immature Granulocytes: 0.64 10*3/uL — ABNORMAL HIGH (ref 0.00–0.07)
Basophils Absolute: 0.1 10*3/uL (ref 0.0–0.1)
Basophils Relative: 0 %
Eosinophils Absolute: 0.1 10*3/uL (ref 0.0–0.5)
Eosinophils Relative: 1 %
HCT: 36.7 % — ABNORMAL LOW (ref 39.0–52.0)
Hemoglobin: 12.1 g/dL — ABNORMAL LOW (ref 13.0–17.0)
Immature Granulocytes: 4 %
Lymphocytes Relative: 14 %
Lymphs Abs: 2.4 10*3/uL (ref 0.7–4.0)
MCH: 30.1 pg (ref 26.0–34.0)
MCHC: 33 g/dL (ref 30.0–36.0)
MCV: 91.3 fL (ref 80.0–100.0)
Monocytes Absolute: 0.6 10*3/uL (ref 0.1–1.0)
Monocytes Relative: 4 %
Neutro Abs: 13.4 10*3/uL — ABNORMAL HIGH (ref 1.7–7.7)
Neutrophils Relative %: 77 %
Platelets: 247 10*3/uL (ref 150–400)
RBC: 4.02 MIL/uL — ABNORMAL LOW (ref 4.22–5.81)
RDW: 13.7 % (ref 11.5–15.5)
WBC: 17.2 10*3/uL — ABNORMAL HIGH (ref 4.0–10.5)
nRBC: 0 % (ref 0.0–0.2)

## 2020-05-29 LAB — GLUCOSE, CAPILLARY
Glucose-Capillary: 126 mg/dL — ABNORMAL HIGH (ref 70–99)
Glucose-Capillary: 112 mg/dL — ABNORMAL HIGH (ref 70–99)
Glucose-Capillary: 127 mg/dL — ABNORMAL HIGH (ref 70–99)
Glucose-Capillary: 116 mg/dL — ABNORMAL HIGH (ref 70–99)

## 2020-05-29 MED ORDER — DOXYCYCLINE HYCLATE 100 MG PO TABS
100.0000 mg | ORAL_TABLET | Freq: Two times a day (BID) | ORAL | Status: DC
  Administered 2020-05-29 – 2020-06-02 (×9): 100 mg via ORAL
  Filled 2020-05-29 (×9): qty 1

## 2020-05-29 MED ORDER — AMOXICILLIN-POT CLAVULANATE 875-125 MG PO TABS
1.0000 | ORAL_TABLET | Freq: Two times a day (BID) | ORAL | Status: DC
  Administered 2020-05-29 – 2020-06-02 (×9): 1 via ORAL
  Filled 2020-05-29 (×9): qty 1

## 2020-05-29 NOTE — Progress Notes (Signed)
OT Cancellation Note  Patient Details Name: Adam Haney MRN: [id number] DOB: October 10, 1965   Cancelled Treatment:    Reason Eval/Treat Not Completed: Pain limiting ability to participate- pt with recent wound vac change, increased pain and now resting in bed. Spouse present and requests OT to return later. Will return as able.   Barry Brunner, OT Acute Rehabilitation Services Pager [phone] Office [phone]   Chancy Milroy 05/29/2020, 9:50 AM

## 2020-05-29 NOTE — Progress Notes (Signed)
PT Cancellation Note  Patient Details Name: Adam Haney MRN: [id number] DOB: Jul 11, 1966   Cancelled Treatment:    Reason Eval/Treat Not Completed: Pain limiting ability to participate.  Pt is still painful after vac change, and will reattempt at another time.   Ivar Drape 05/29/2020, 5:06 PM   Samul Dada, PT MS Acute Rehab Dept. Number: [facility] [id number] and [facility] [phone]

## 2020-05-29 NOTE — Progress Notes (Signed)
PROGRESS NOTE    Adam Haney  MWU:[id number] DOB: Nov 18, 1965 DOA: 05/25/2020 PCP: [name], MD   Chief Complain: Fever, chills, groin pain  Brief Narrative:  Patient is a 54 year old male with history of diabetes type 2, coronary artery  disease status post PCI, obesity who presented with fever, chills and groin pain from home.  On presentation, CT showed soft tissue infection in the groin with edema and gas, elevated leukocytosis and he was febrile presentation.  Was suspected to have Fournier's gangrene and was seen by general surgery and was taken to the OR for I&D of the right groin and scrotum. Underwent another I&D on 05/27/20.  TRH is consulting for medical assistance.  Assessment & Plan:   Principal Problem:   Necrotizing soft tissue infection Active Problems:   Tobacco abuse   Coronary atherosclerosis of native coronary artery   Hyperlipidemia   Hyperglycemia   Class 1 obesity due to excess calories with body mass index (BMI) of 32.0 to 32.9 in adult   Severe sepsis: Presented with fever leukocytosis, tachycardia.  Will follow blood cultures,NGTD.  Currently on vancomycin, Zosyn .We stopped  Clindamycin for concern of hepatotoxicity due to elevated liver enzymes.  Still has significant leukocytosis but is afebrile now.  Fournier's gangrene: Underwent I&D on 11/1 and 11/3.  General surgery managing.Wound culture showing  mixed organisms and also showed few actinomyces.  On PCA pump for pain control.  ID recommended Augmentin and doxycycline for 7 days and follow-up with ID clinic on 06/04/2020.  Elevated liver enzymes: Viral hepatitis panel negative. Crestor and zetia held, we will recommend holding until significant improvement in the liver function.. Normal CK.discontinued clindamycin. Ultrasound of the liver showed fatty infiltration. Liver enzymes have started trending down.  Prediabetes with hyperglycemia: Elevated blood sugars.  Not on antihyperglycemic's at home.   Hemoglobin A1c of 6.3.  Glucose elevated due to sepsis.  Continue sliding-scale insulin.  Coronary artery disease: Currently stable.  Status post PCI artery.  On Crestor, Zetia and metoprolol at home.  Takes Plavix at home which is on hold. Must resume  if there is no further plan for surgical intervention.         DVT prophylaxis:Lovenox Code Status: Full Family Communication: Discussed with wife at the bedside Status is: Inpatient    Procedures: I&D  Antimicrobials:  Anti-infectives (From admission, onward)   Start     Dose/Rate Route Frequency Ordered Stop   05/25/20 2200  vancomycin (VANCOCIN) IVPB 1000 mg/200 mL premix        1,000 mg 200 mL/hr over 60 Minutes Intravenous Every 12 hours 05/25/20 1626     05/25/20 2000  clindamycin (CLEOCIN) IVPB 600 mg  Status:  Discontinued        600 mg 100 mL/hr over 30 Minutes Intravenous Every 6 hours 05/25/20 1838 05/28/20 0749   05/25/20 2000  piperacillin-tazobactam (ZOSYN) IVPB 3.375 g        3.375 g 12.5 mL/hr over 240 Minutes Intravenous Every 8 hours 05/25/20 1838     05/25/20 1245  clindamycin (CLEOCIN) IVPB 900 mg        900 mg 100 mL/hr over 30 Minutes Intravenous  Once 05/25/20 1235 05/25/20 1437   05/25/20 1015  vancomycin (VANCOREADY) IVPB 1750 mg/350 mL        1,750 mg 175 mL/hr over 120 Minutes Intravenous  Once 05/25/20 0925 05/25/20 1841   05/25/20 0930  piperacillin-tazobactam (ZOSYN) IVPB 3.375 g  3.375 g 100 mL/hr over 30 Minutes Intravenous  Once 05/25/20 0925 05/25/20 1022      Subjective:  Patient seen and examined the bedside this morning.  He feels overall better than yesterday but had again severe pain on his right groin after the wound VAC was changed.  Objective: Vitals:   05/28/20 1537 05/28/20 1907 05/28/20 2311 05/29/20 0301  BP: (!) 102/59 130/78 (!) 148/84 131/82  Pulse: 76 68 81 66  Resp: 19 14 18 14   Temp: 98.3 F (36.8 C) 98.8 F (37.1 C) 98.8 F (37.1 C) 99.4 F (37.4 C)   TempSrc: Oral Oral Oral Oral  SpO2: 100% 100% 100% 96%  Weight:      Height:        Intake/Output Summary (Last 24 hours) at 05/29/2020 0805 Last data filed at 05/29/2020 0008 Gross per 24 hour  Intake 640 ml  Output 1250 ml  Net -610 ml   Filed Weights   05/25/20 1255 05/27/20 0835  Weight: 114.3 kg 114.3 kg    Examination:   General exam: Comfortable, obese HEENT:PERRL,Oral mucosa moist, Ear/Nose normal on gross exam Respiratory system: Bilateral equal air entry, normal vesicular breath sounds, no wheezes or crackles  Cardiovascular system: S1 & S2 heard, RRR. No JVD, murmurs, rubs, gallops or clicks. Gastrointestinal system: Abdomen is nondistended, soft and nontender. No organomegaly or masses felt. Normal bowel sounds heard. Central nervous system: Alert and oriented. No focal neurological deficits. Extremities: No edema, no clubbing ,no cyanosis Skin: no icterus ,no pallor MSK: Right groin wound with wound VAC     Data Reviewed: I have personally reviewed following labs and imaging studies  CBC: Recent Labs  Lab 05/25/20 0357 05/26/20 0602 05/27/20 0149 05/28/20 0100 05/29/20 0306  WBC 23.3* 20.7* 18.5* 16.5* 17.2*  NEUTROABS  --   --   --  12.9* 13.4*  HGB 13.9 12.4* 11.3* 11.7* 12.1*  HCT 41.1 37.7* 34.9* 35.5* 36.7*  MCV 89.9 90.0 91.1 91.0 91.3  PLT 270 219 237 246 247   Basic Metabolic Panel: Recent Labs  Lab 05/25/20 0357 05/26/20 0602 05/27/20 0149 05/28/20 0100 05/29/20 0306  NA 132* 134* 134* 135 132*  K 3.5 4.2 3.7 3.9 3.6  CL 98 101 97* 99 96*  CO2 20* 26 30 29 29   GLUCOSE 219* 247* 141* 117* 138*  BUN 19 18 20 13 11   CREATININE 0.85 0.90 0.93 0.81 0.86  CALCIUM 8.6* 7.7* 7.6* 7.8* 8.0*   GFR: Estimated Creatinine Clearance: 133.5 mL/min (by C-G formula based on SCr of 0.86 mg/dL). Liver Function Tests: Recent Labs  Lab 05/25/20 0357 05/26/20 0602 05/27/20 0149 05/28/20 0100 05/29/20 0306  AST 200* 72* 180* 256* 156*  ALT  190* 151* 215* 339* 298*  ALKPHOS 135* 95 91 109 120  BILITOT 1.0 0.9 0.8 1.2 1.4*  PROT 7.0 5.9* 5.5* 5.9* 6.6  ALBUMIN 2.5* 2.0* 1.7* 1.9* 1.9*   No results for input(s): LIPASE, AMYLASE in the last 168 hours. No results for input(s): AMMONIA in the last 168 hours. Coagulation Profile: No results for input(s): INR, PROTIME in the last 168 hours. Cardiac Enzymes: Recent Labs  Lab 05/27/20 1148  CKTOTAL 50   BNP (last 3 results) No results for input(s): PROBNP in the last 8760 hours. HbA1C: No results for input(s): HGBA1C in the last 72 hours. CBG: Recent Labs  Lab 05/28/20 0726 05/28/20 1152 05/28/20 1636 05/28/20 2057 05/29/20 0729  GLUCAP 133* 139* 199* 142* 126*   Lipid Profile:  No results for input(s): CHOL, HDL, LDLCALC, TRIG, CHOLHDL, LDLDIRECT in the last 72 hours. Thyroid Function Tests: No results for input(s): TSH, T4TOTAL, FREET4, T3FREE, THYROIDAB in the last 72 hours. Anemia Panel: No results for input(s): VITAMINB12, FOLATE, FERRITIN, TIBC, IRON, RETICCTPCT in the last 72 hours. Sepsis Labs: Recent Labs  Lab 05/25/20 0357 05/25/20 0933  LATICACIDVEN 1.8 1.6    Recent Results (from the past 240 hour(s))  COVID-19, Flu A+B and RSV ([facility])     Status: None   Collection Time: 05/20/20 10:20 AM  Result Value Ref Range Status   SARS-CoV-2, NAA Not Detected Not Detected Final    Comment: This nucleic acid amplification test was developed and its performance characteristics determined by [facility]. Nucleic acid amplification tests include RT-PCR and TMA. This test has not been FDA cleared or approved. This test has been authorized by FDA under an Emergency Use Authorization (EUA). This test is only authorized for the duration of time the declaration that circumstances exist justifying the authorization of the emergency use of in vitro diagnostic tests for detection of SARS-CoV-2 virus and/or diagnosis of COVID-19 infection under section  564(b)(1) of the Act, 21 U.S.C. 409WJX-9(J360bbb-3(b) (1), unless the authorization is terminated or revoked sooner. When diagnostic testing is negative, the possibility of a false negative result should be considered in the context of a patient's recent exposures and the presence of clinical signs and symptoms consistent with COVID-19. An individual without symptoms of COVID-19 and who is not shedding SARS-CoV-2 virus wo uld expect to have a negative (not detected) result in this assay.    Influenza A, NAA Not Detected Not Detected Final   Influenza B, NAA Not Detected Not Detected Final   RSV, NAA Not Detected Not Detected Final  Blood culture (routine x 2)     Status: None (Preliminary result)   Collection Time: 05/25/20  7:38 AM   Specimen: BLOOD  Result Value Ref Range Status   Specimen Description BLOOD LEFT ANTECUBITAL  Final   Special Requests   Final    BOTTLES DRAWN AEROBIC AND ANAEROBIC Blood Culture adequate volume   Culture   Final    NO GROWTH 4 DAYS Performed at [facility], [address] [place], [place] [address]    Report Status PENDING  Incomplete  Blood culture (routine x 2)     Status: None (Preliminary result)   Collection Time: 05/25/20  9:33 AM   Specimen: BLOOD  Result Value Ref Range Status   Specimen Description BLOOD RIGHT ANTECUBITAL  Final   Special Requests   Final    BOTTLES DRAWN AEROBIC AND ANAEROBIC Blood Culture adequate volume   Culture   Final    NO GROWTH 4 DAYS Performed at [facility], [address] [place], [place] [address]    Report Status PENDING  Incomplete  Respiratory Panel by RT PCR (Flu A&B, Covid) - Nasopharyngeal Swab     Status: None   Collection Time: 05/25/20 10:06 AM   Specimen: Nasopharyngeal Swab  Result Value Ref Range Status   SARS Coronavirus 2 by RT PCR NEGATIVE NEGATIVE Final    Comment: (NOTE) SARS-CoV-2 target nucleic acids are NOT DETECTED.  The SARS-CoV-2 RNA is generally detectable in upper  respiratoy specimens during the acute phase of infection. The lowest concentration of SARS-CoV-2 viral copies this assay can detect is 131 copies/mL. A negative result does not preclude SARS-Cov-2 infection and should not be used as the sole basis for  treatment or other patient management decisions. A negative result may occur with  improper specimen collection/handling, submission of specimen other than nasopharyngeal swab, presence of viral mutation(s) within the areas targeted by this assay, and inadequate number of viral copies (<131 copies/mL). A negative result must be combined with clinical observations, patient history, and epidemiological information. The expected result is Negative.  Fact Sheet for Patients:  https://www.moore.com/  Fact Sheet for Healthcare Providers:  https://www.young.biz/  This test is no t yet approved or cleared by the [place] FDA and  has been authorized for detection and/or diagnosis of SARS-CoV-2 by FDA under an Emergency Use Authorization (EUA). This EUA will remain  in effect (meaning this test can be used) for the duration of the COVID-19 declaration under Section 564(b)(1) of the Act, 21 U.S.C. section 360bbb-3(b)(1), unless the authorization is terminated or revoked sooner.     Influenza A by PCR NEGATIVE NEGATIVE Final   Influenza B by PCR NEGATIVE NEGATIVE Final    Comment: (NOTE) The Xpert Xpress SARS-CoV-2/FLU/RSV assay is intended as an aid in  the diagnosis of influenza from Nasopharyngeal swab specimens and  should not be used as a sole basis for treatment. Nasal washings and  aspirates are unacceptable for Xpert Xpress SARS-CoV-2/FLU/RSV  testing.  Fact Sheet for Patients: https://www.moore.com/  Fact Sheet for Healthcare Providers: https://www.young.biz/  This test is not yet approved or cleared by the [place] FDA and  has been  authorized for detection and/or diagnosis of SARS-CoV-2 by  FDA under an Emergency Use Authorization (EUA). This EUA will remain  in effect (meaning this test can be used) for the duration of the  Covid-19 declaration under Section 564(b)(1) of the Act, 21  U.S.C. section 360bbb-3(b)(1), unless the authorization is  terminated or revoked. Performed at [facility], [address], [place], [place] [address]   Aerobic/Anaerobic Culture (surgical/deep wound)     Status: None (Preliminary result)   Collection Time: 05/25/20  2:17 PM   Specimen: PATH Other; Tissue  Result Value Ref Range Status   Specimen Description ABSCESS SCROTUM  Final   Special Requests PT ON CLINDAMYCIN  Final   Gram Stain   Final    RARE WBC PRESENT,BOTH PMN AND MONONUCLEAR ABUNDANT GRAM POSITIVE COCCI MODERATE GRAM NEGATIVE RODS FEW GRAM POSITIVE RODS Performed at [facility], [address] [place], [place] [address]    Culture   Final    FEW ACTINOMYCES SPECIES NO ANAEROBES ISOLATED; CULTURE IN PROGRESS FOR 5 DAYS    Report Status PENDING  Incomplete         Radiology Studies: US LIVER DOPPLER  Result Date: 05/28/2020 CLINICAL DATA:  Elevated LFTs EXAM: DUPLEX ULTRASOUND OF LIVER TECHNIQUE: Color and duplex Doppler ultrasound was performed to evaluate the hepatic in-flow and out-flow vessels. COMPARISON:  None. FINDINGS: Liver: Mildly increased and heterogeneous normal hepatic contour without nodularity. No focal lesion, mass or intrahepatic biliary ductal dilatation. Main Portal Vein size: 0.8 cm Portal Vein Velocities Main Prox:  21 cm/sec Main Mid: 21 cm/sec Main Dist:  22 cm/sec Right: 15 cm/sec Left: 24 cm/sec Hepatic Vein Velocities Right:  16 cm/sec Middle:  29 cm/sec Left:  31 cm/sec IVC: Present and patent with normal respiratory phasicity. Hepatic Artery Velocity:  109 cm/sec Splenic Vein Velocity:  24 cm/sec Spleen: 8.3 cm x 11.1 cm x 3.2 cm with a total volume of 152 cm^3 (411 cm^3  is upper limit normal) Portal Vein Occlusion/Thrombus: No Splenic Vein  Occlusion/Thrombus: No Ascites: None Varices: None Hepatic, portal and splenic veins are patent with normal directional flow. Negative for portal vein occlusion or thrombus. No ascites. IMPRESSION: Normal hepatic venous Doppler. Electronically Signed   By: Judie Petit.  Shick M.D.   On: 05/28/2020 15:39   US Abdomen Limited RUQ (LIVER/GB)  Result Date: 05/28/2020 CLINICAL DATA:  Elevated LFTs EXAM: ULTRASOUND ABDOMEN LIMITED RIGHT UPPER QUADRANT COMPARISON:  CT from 05/25/2020 FINDINGS: Gallbladder: No gallstones or wall thickening visualized. No sonographic Murphy sign noted by sonographer. Common bile duct: Diameter: 3.8 mm. Liver: Mild increased echogenicity is noted without focal mass. Portal vein is patent on color Doppler imaging with normal direction of blood flow towards the liver. Other: None. IMPRESSION: Mild increased echogenicity within the liver which may be related to fatty infiltration. No other focal abnormality is noted. Electronically Signed   By: Alcide Clever M.D.   On: 05/28/2020 17:20        Scheduled Meds: . acetaminophen  650 mg Oral Q6H   Or  . acetaminophen  650 mg Rectal Q6H  . Chlorhexidine Gluconate Cloth  6 each Topical Daily  . docusate sodium  100 mg Oral BID  . insulin aspart  0-15 Units Subcutaneous TID WC  . insulin aspart  0-5 Units Subcutaneous QHS  . methocarbamol  500 mg Oral Q6H  . metoprolol tartrate  25 mg Oral BID  . mupirocin ointment  1 application Nasal BID  . polyethylene glycol  17 g Oral Daily   Continuous Infusions: . piperacillin-tazobactam 3.375 g (05/29/20 0452)  . vancomycin 1,000 mg (05/29/20 0046)     LOS: 4 days    Time spent:25 mins. More than 50% of that time was spent in counseling and/or coordination of care.      Burnadette Pop, MD [facility] P11/11/2019, 8:05 AM

## 2020-05-29 NOTE — Consult Note (Addendum)
WOC Nurse Consult Note: Reason for Consult: Consult requested for Vac dressing change.  Surgical PA at the beside to assist with dressing change and assess wound appearance.  Medicated for pain prior to the procedure with IV medication and another dose during the process, but pt experienced extreme pain during the dressing change.  Wound type: Full thickness post-op wound to right groin/scrotum Measurement: 34cmx15cmx9cm according to OR notes Wound bed: Beefy red Drainage (amount, consistency, odor) mod amt pink drainage in the cannister, no odor Periwound: intact skin surrounding. Dressing procedure/placement/frequency: Removed previous pieces of sponge.  Applied 2 barrier rings around inner and lower wound edges, then inserted 2 pieces black sponge to cont suction. WOC team will plan to change dressing Q M/W/F.  Pt will have difficulty tolerating dressing changes without IV medications once he is discharged.  Cammie Mcgee MSN, RN, CWOCN, [place], CNS [phone]

## 2020-05-29 NOTE — Progress Notes (Signed)
PT Cancellation Note  Patient Details Name: GERVASE COLBERG MRN: [id number] DOB: 08/16/1965   Cancelled Treatment:    Reason Eval/Treat Not Completed: Pain limiting ability to participate.  Pt was unable to get OOB due to being in pain from wound care, but is agreeable to trying after meds are due again.  Retry as time and patient allow.   Ivar Drape 05/29/2020, 12:32 PM

## 2020-05-29 NOTE — Progress Notes (Signed)
General Surgery Follow Up Note  Subjective:    Overnight Issues:   Objective:  Vital signs for last 24 hours: Temp:  [98.2 F (36.8 C)-99.4 F (37.4 C)] 99.4 F (37.4 C) (11/05 0301) Pulse Rate:  [65-81] 66 (11/05 0301) Resp:  [13-19] 14 (11/05 0301) BP: (102-148)/(59-84) 131/82 (11/05 0301) SpO2:  [90 %-100 %] 96 % (11/05 0301)  Hemodynamic parameters for last 24 hours:    Intake/Output from previous day: 11/04 0701 - 11/05 0700 In: 760 [P.O.:360; IV Piggyback:400] Out: 1250 [Urine:1250]  Intake/Output this shift: No intake/output data recorded.  Vent settings for last 24 hours:    Physical Exam:  Gen: comfortable, no distress Neuro: non-focal exam HEENT: PERRL Neck: supple CV: RRR Pulm: unlabored breathing Abd: soft, NT GU: clear yellow urine, vac with good seal Extr: wwp, no edema   Results for orders placed or performed during the hospital encounter of 05/25/20 (from the past 24 hour(s))  Glucose, capillary     Status: Abnormal   Collection Time: 05/28/20 11:52 AM  Result Value Ref Range   Glucose-Capillary 139 (H) 70 - 99 mg/dL  Glucose, capillary     Status: Abnormal   Collection Time: 05/28/20  4:36 PM  Result Value Ref Range   Glucose-Capillary 199 (H) 70 - 99 mg/dL  Glucose, capillary     Status: Abnormal   Collection Time: 05/28/20  8:57 PM  Result Value Ref Range   Glucose-Capillary 142 (H) 70 - 99 mg/dL  CBC with Differential/Platelet     Status: Abnormal   Collection Time: 05/29/20  3:06 AM  Result Value Ref Range   WBC 17.2 (H) 4.0 - 10.5 K/uL   RBC 4.02 (L) 4.22 - 5.81 MIL/uL   Hemoglobin 12.1 (L) 13.0 - 17.0 g/dL   HCT 50.3 (L) 39 - 52 %   MCV 91.3 80.0 - 100.0 fL   MCH 30.1 26.0 - 34.0 pg   MCHC 33.0 30.0 - 36.0 g/dL   RDW 54.6 56.8 - 12.7 %   Platelets 247 150 - 400 K/uL   nRBC 0.0 0.0 - 0.2 %   Neutrophils Relative % 77 %   Neutro Abs 13.4 (H) 1.7 - 7.7 K/uL   Lymphocytes Relative 14 %   Lymphs Abs 2.4 0.7 - 4.0 K/uL    Monocytes Relative 4 %   Monocytes Absolute 0.6 0.1 - 1.0 K/uL   Eosinophils Relative 1 %   Eosinophils Absolute 0.1 0.0 - 0.5 K/uL   Basophils Relative 0 %   Basophils Absolute 0.1 0.0 - 0.1 K/uL   Immature Granulocytes 4 %   Abs Immature Granulocytes 0.64 (H) 0.00 - 0.07 K/uL  Comprehensive metabolic panel     Status: Abnormal   Collection Time: 05/29/20  3:06 AM  Result Value Ref Range   Sodium 132 (L) 135 - 145 mmol/L   Potassium 3.6 3.5 - 5.1 mmol/L   Chloride 96 (L) 98 - 111 mmol/L   CO2 29 22 - 32 mmol/L   Glucose, Bld 138 (H) 70 - 99 mg/dL   BUN 11 6 - 20 mg/dL   Creatinine, Ser 5.17 0.61 - 1.24 mg/dL   Calcium 8.0 (L) 8.9 - 10.3 mg/dL   Total Protein 6.6 6.5 - 8.1 g/dL   Albumin 1.9 (L) 3.5 - 5.0 g/dL   AST 001 (H) 15 - 41 U/L   ALT 298 (H) 0 - 44 U/L   Alkaline Phosphatase 120 38 - 126 U/L   Total Bilirubin 1.4 (  H) 0.3 - 1.2 mg/dL   GFR, Estimated >16 >10 mL/min   Anion gap 7 5 - 15  Glucose, capillary     Status: Abnormal   Collection Time: 05/29/20  7:29 AM  Result Value Ref Range   Glucose-Capillary 126 (H) 70 - 99 mg/dL   Comment 1 Notify RN     Assessment & Plan: The plan of care was discussed with the bedside nurse for the day, who is in agreement with this plan and no additional concerns were raised.   Present on Admission: . Necrotizing soft tissue infection . Coronary atherosclerosis of native coronary artery . Hyperlipidemia . Tobacco abuse . Hyperglycemia    LOS: 4 days   Additional comments:I reviewed the patient's new clinical lab test results.   and I reviewed the patients new imaging test results.    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 Bedelia Person POD#3 S/p incision and  debridement of right groin and scrotum, negative pressure wound vac application 34cmx15cmx9cm11/3 Dr. Bedelia Person - cx with few actinomyces, ID c/s. Plan for 7d course of doxy/aug and f/u with ID for possible 3-38m course of abx for actino - path: no malignancy seen - Pain control with optimization of PO. First vac change today, will try without IV meds, but they are available.   - Cont to mobilize - Home health orders placed for vac. Possible d/c today depending on vac change  ID -clinda 11/1>>11/4, zosyn/vancomycin 11/1>> FEN - CM diet VTE -lovenox Foley -out, voiding Follow up -TBD  Diamantina Monks, MD Trauma & General Surgery Please use [email or web address] to contact on call provider  05/29/2020  *Care during the described time interval was provided by me. I have reviewed this patient's available data, including medical history, events of note, physical examination and test results as part of my evaluation.

## 2020-05-29 NOTE — Progress Notes (Signed)
[facility]  Date of Admission:  05/25/2020      Total days of antibiotics 5  Vancomycin + Zosyn, day 5  Received 2 days clindamycin         ASSESSMENT: Adam Haney is a 54 y.o. 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 He has had 2 surgical debridements and wound today appears very clean, good granulation, no purulence and only mild inflammation to proximal wound boarder. He will remain over the weekend for pain control with VAC dressing changes with consideration for D/C Monday.   Doing well and taking in POs a little better now including more solid foods. OK to start PO treatment today with Doxycycline + Augmentin with meals to help with possible nausea.   He has an appointment arranged in a week to ensure proper timing to change over to amoxicillin long term thereafter for actinomyces species.   Dr. Drue Second is available over the weekend if needed.    PLAN: 1. Change over to PO Doxycycline + Augmentin BID with meals --> plan 7 more days to cover until follow up appt  2. Follow up arranged 06/04/20 in ID clinic    Principal Problem:   Necrotizing soft tissue infection Active Problems:   Tobacco abuse   Coronary atherosclerosis of native coronary artery   Hyperlipidemia   Hyperglycemia   Class 1 obesity due to excess calories with body mass index (BMI) of 32.0 to 32.9 in adult   . acetaminophen  650 mg Oral Q6H   Or  . acetaminophen  650 mg Rectal Q6H  . Chlorhexidine Gluconate Cloth  6 each Topical Daily  . docusate sodium  100 mg Oral BID  . insulin aspart  0-15 Units Subcutaneous TID WC  . insulin aspart  0-5 Units Subcutaneous QHS  . methocarbamol  500 mg Oral Q6H  . metoprolol tartrate  25 mg Oral BID  . mupirocin ointment  1 application Nasal BID  . polyethylene glycol  17 g Oral Daily    SUBJECTIVE: Pain with the dressing change today - sleepy from IV morphine.  Eating some foods  now, had 2 BMs this morning.  States he thinks he is getting thrush on his tongue. Not painful however.    Review of Systems: Review of Systems  Constitutional: Negative for chills and fever.  Gastrointestinal: Negative for abdominal pain, diarrhea and vomiting.  Musculoskeletal: Negative for back pain and joint pain.    No Known Allergies  OBJECTIVE: Vitals:   05/28/20 1907 05/28/20 2311 05/29/20 0301 05/29/20 0928  BP: 130/78 (!) 148/84 131/82 135/76  Pulse: 68 81 66   Resp: 14 18 14 14   Temp: 98.8 F (37.1 C) 98.8 F (37.1 C) 99.4 F (37.4 C) 98.2 F (36.8 C)  TempSrc: Oral Oral Oral Oral  SpO2: 100% 100% 96% 99%  Weight:      Height:       Body mass index is 32.34 kg/m.  Physical Exam Constitutional:      Comments: Resting in bed quietly.   HENT:     Mouth/Throat:     Comments: White film on tongue. No erythema or pain.  Cardiovascular:     Rate and Rhythm: Normal rate and regular rhythm.     Pulses: Normal pulses.  Pulmonary:     Effort: Pulmonary effort is normal.     Breath sounds: Normal breath sounds.  Abdominal:  General: Bowel sounds are normal. There is no distension.     Palpations: Abdomen is soft.  Skin:    General: Skin is warm and dry.     Capillary Refill: Capillary refill takes less than 2 seconds.  Neurological:     Mental Status: He is alert and oriented to person, place, and time.     Wound vac change with surgery team today.      Lab Results Lab Results  Component Value Date   WBC 17.2 (H) 05/29/2020   HGB 12.1 (L) 05/29/2020   HCT 36.7 (L) 05/29/2020   MCV 91.3 05/29/2020   PLT 247 05/29/2020    Lab Results  Component Value Date   CREATININE 0.86 05/29/2020   BUN 11 05/29/2020   NA 132 (L) 05/29/2020   K 3.6 05/29/2020   CL 96 (L) 05/29/2020   CO2 29 05/29/2020    Lab Results  Component Value Date   ALT 298 (H) 05/29/2020   AST 156 (H) 05/29/2020   ALKPHOS 120 05/29/2020   BILITOT 1.4 (H) 05/29/2020      Microbiology: Wound Cx 11/01 >> gram stain +++GPC, ++GNR, +GPR, growth with actinomyces species    Adam Alberts, MSN, NP-C [facility]  [email or web address] Pager: [phone] Office: [phone] [facility] Main Line: [phone]

## 2020-05-29 NOTE — TOC Initial Note (Signed)
Transition of Care [facility]) - Initial/Assessment Note    Patient Details  Name: Adam Haney MRN: [id number] Date of Birth: [birth year]  [facility] CM/SW Contact:    Mearl Latin, LCSW Phone Number: 05/29/2020, 3:19 PM  Clinical Narrative:                 Covering CSW received consult regarding need for wound vac at home and [facility]. CSW placed [employer] paperwork on hard chart to by signed by surgery; PA aware. CSW contacted the following Home Health agencies and patient was declined by them all due to insurance: [facility], [facility], [facility], [facility], [facility], [facility], [facility], [facility].  Per Surgical PA, patient unable to feasibly go to their office three times a week for vac changes. CSW spoke with [employer] through [place] [phone]). Their care coordinator, Manley Mason., will make calls to see if they can find an agency. CSW emailed requested clinicals securely ([email or web address]).   Expected Discharge Plan: Home w Home Health Services Barriers to Discharge: Continued Medical Work up, No Home Care Agency will accept this patient, Equipment Delay   Patient Goals and CMS Choice        Expected Discharge Plan and Services Expected Discharge Plan: Home w Home Health Services   Discharge Planning Services: CM Consult Post Acute Care Choice: Home Health, Durable Medical Equipment Living arrangements for the past 2 months: Single Family Home                                      Prior Living Arrangements/Services Living arrangements for the past 2 months: Single Family Home Lives with:: Significant Other Patient language and need for interpreter reviewed:: Yes Do you feel safe going back to the place where you live?: Yes      Need for Family Participation in Patient Care: Yes (Comment) Care giver support system in place?: Yes (comment)   Criminal Activity/Legal Involvement Pertinent to Current  Situation/Hospitalization: No - Comment as needed  Activities of Daily Living Home Assistive Devices/Equipment: None ADL Screening (condition at time of admission) Patient's cognitive ability adequate to safely complete daily activities?: Yes Is the patient deaf or have difficulty hearing?: No Does the patient have difficulty seeing, even when wearing glasses/contacts?: No Does the patient have difficulty concentrating, remembering, or making decisions?: No Patient able to express need for assistance with ADLs?: Yes Does the patient have difficulty dressing or bathing?: No Independently performs ADLs?: Yes (appropriate for developmental age) Does the patient have difficulty walking or climbing stairs?: No Weakness of Legs: None Weakness of Arms/Hands: None  Permission Sought/Granted Permission sought to share information with : Oceanographer granted to share information with : Yes, Verbal Permission Granted     Permission granted to share info w AGENCY: HH        Emotional Assessment Appearance:: Appears stated age     Orientation: : Oriented to Self, Oriented to Place, Oriented to  Time, Oriented to Situation Alcohol / Substance Use: Not Applicable Psych Involvement: No (comment)  Admission diagnosis:  Cellulitis of groin, right [L03.314] Necrotizing soft tissue infection [M79.89] Patient Active Problem List   Diagnosis Date Noted  . Necrotizing soft tissue infection 05/25/2020  . Hyperglycemia 05/25/2020  . Class 1 obesity due to excess calories with body mass index (BMI) of 32.0 to 32.9 in adult 05/25/2020  . NSVT (nonsustained ventricular  tachycardia) (HCC)   . Hyperlipidemia   . ST elevation myocardial infarction (STEMI) of inferior wall (HCC) 11/19/2012  . Tobacco abuse 11/19/2012  . Coronary atherosclerosis of native coronary artery 11/19/2012   PCP:  [name], MD Pharmacy:   [facility]- [place], [place]  - [address] [place] [address] Phone: [phone] Fax: [phone]  [id number] - [place], [place] - [address] AT [facility] [address] [place] [address] Phone: [phone] Fax: [phone]     Social Determinants of Health (SDOH) Interventions    Readmission Risk Interventions No flowsheet data found.

## 2020-05-29 NOTE — Progress Notes (Signed)
   Wound very clean, healthy granulation tissue. No purulent drainage noted in wound. Mild induration remains at left lateral corner.  Patient did require IV morphine during vac change. Not ready for discharge. Plan to keep over the weekend and perform next vac change on Monday.  Franne Forts, PA-C [facility] 05/29/2020, 9:28 AM Please see Amion for pager number during day hours 7:00am-4:30pm

## 2020-05-30 LAB — CBC WITH DIFFERENTIAL/PLATELET
Abs Immature Granulocytes: 0.33 10*3/uL — ABNORMAL HIGH (ref 0.00–0.07)
Basophils Absolute: 0.1 10*3/uL (ref 0.0–0.1)
Basophils Relative: 0 %
Eosinophils Absolute: 0 10*3/uL (ref 0.0–0.5)
Eosinophils Relative: 0 %
HCT: 38.9 % — ABNORMAL LOW (ref 39.0–52.0)
Hemoglobin: 12.8 g/dL — ABNORMAL LOW (ref 13.0–17.0)
Immature Granulocytes: 2 %
Lymphocytes Relative: 9 %
Lymphs Abs: 1.3 10*3/uL (ref 0.7–4.0)
MCH: 29 pg (ref 26.0–34.0)
MCHC: 32.9 g/dL (ref 30.0–36.0)
MCV: 88 fL (ref 80.0–100.0)
Monocytes Absolute: 0.6 10*3/uL (ref 0.1–1.0)
Monocytes Relative: 4 %
Neutro Abs: 12.6 10*3/uL — ABNORMAL HIGH (ref 1.7–7.7)
Neutrophils Relative %: 85 %
Platelets: 233 10*3/uL (ref 150–400)
RBC: 4.42 MIL/uL (ref 4.22–5.81)
RDW: 13.4 % (ref 11.5–15.5)
WBC: 14.9 10*3/uL — ABNORMAL HIGH (ref 4.0–10.5)
nRBC: 0 % (ref 0.0–0.2)

## 2020-05-30 LAB — COMPREHENSIVE METABOLIC PANEL
ALT: 194 U/L — ABNORMAL HIGH (ref 0–44)
AST: 51 U/L — ABNORMAL HIGH (ref 15–41)
Albumin: 2 g/dL — ABNORMAL LOW (ref 3.5–5.0)
Alkaline Phosphatase: 125 U/L (ref 38–126)
Anion gap: 8 (ref 5–15)
BUN: 10 mg/dL (ref 6–20)
CO2: 23 mmol/L (ref 22–32)
Calcium: 8.1 mg/dL — ABNORMAL LOW (ref 8.9–10.3)
Chloride: 98 mmol/L (ref 98–111)
Creatinine, Ser: 0.73 mg/dL (ref 0.61–1.24)
GFR, Estimated: >60 mL/min (ref >60)
Glucose, Bld: 117 mg/dL — ABNORMAL HIGH (ref 70–99)
Potassium: 4.2 mmol/L (ref 3.5–5.1)
Sodium: 129 mmol/L — ABNORMAL LOW (ref 135–145)
Total Bilirubin: 1.5 mg/dL — ABNORMAL HIGH (ref 0.3–1.2)
Total Protein: 7.5 g/dL (ref 6.5–8.1)

## 2020-05-30 LAB — GLUCOSE, CAPILLARY
Glucose-Capillary: 134 mg/dL — ABNORMAL HIGH (ref 70–99)
Glucose-Capillary: 126 mg/dL — ABNORMAL HIGH (ref 70–99)
Glucose-Capillary: 123 mg/dL — ABNORMAL HIGH (ref 70–99)
Glucose-Capillary: 127 mg/dL — ABNORMAL HIGH (ref 70–99)

## 2020-05-30 LAB — CULTURE, BLOOD (ROUTINE X 2)
Culture: NO GROWTH
Culture: NO GROWTH
Special Requests: ADEQUATE
Special Requests: ADEQUATE

## 2020-05-30 MED ORDER — JUVEN PO PACK
1.0000 | PACK | Freq: Two times a day (BID) | ORAL | Status: DC
  Administered 2020-05-31 – 2020-06-02 (×5): 1 via ORAL
  Filled 2020-05-30 (×6): qty 1

## 2020-05-30 MED ORDER — ADULT MULTIVITAMIN W/MINERALS CH
1.0000 | ORAL_TABLET | Freq: Every day | ORAL | Status: DC
  Administered 2020-05-30 – 2020-06-02 (×4): 1 via ORAL
  Filled 2020-05-30 (×4): qty 1

## 2020-05-30 MED ORDER — MAGIC MOUTHWASH W/LIDOCAINE
10.0000 mL | Freq: Four times a day (QID) | ORAL | Status: DC | PRN
  Administered 2020-05-31 – 2020-06-01 (×2): 10 mL via ORAL
  Filled 2020-05-30 (×2): qty 10

## 2020-05-30 MED ORDER — PROSOURCE PLUS PO LIQD
30.0000 mL | Freq: Two times a day (BID) | ORAL | Status: DC
  Administered 2020-05-30 – 2020-06-02 (×6): 30 mL via ORAL
  Filled 2020-05-30 (×8): qty 30

## 2020-05-30 MED ORDER — ENSURE MAX PROTEIN PO LIQD
11.0000 [oz_av] | Freq: Two times a day (BID) | ORAL | Status: DC
  Administered 2020-05-30 – 2020-06-02 (×6): 11 [oz_av] via ORAL
  Filled 2020-05-30 (×7): qty 330

## 2020-05-30 NOTE — Progress Notes (Signed)
PROGRESS NOTE    Adam Haney  CBJ:[id number] DOB: 07-19-1966 DOA: 05/25/2020 PCP: [name], MD   Chief Complain: Fever, chills, groin pain  Brief Narrative:  Patient is a 54 year old male with history of diabetes type 2, coronary artery  disease status post PCI, obesity who presented with fever, chills and groin pain from home.  On presentation, CT showed soft tissue infection in the groin with edema and gas, elevated leukocytosis and he was febrile presentation.  Was suspected to have Fournier's gangrene and was seen by general surgery and was taken to the OR for I&D of the right groin and scrotum. Underwent another I&D on 05/27/20.  [facility] is consulting for medical assistance.  Assessment & Plan:   Principal Problem:   Necrotizing soft tissue infection Active Problems:   Tobacco abuse   Coronary atherosclerosis of native coronary artery   Hyperlipidemia   Hyperglycemia   Class 1 obesity due to excess calories with body mass index (BMI) of 32.0 to 32.9 in adult   Severe sepsis: Presented with fever leukocytosis, tachycardia.  Will follow blood cultures,NGTD.  Currently on vancomycin, Zosyn .We stopped  Clindamycin for concern of hepatotoxicity due to elevated liver enzymes.  Still has significant leukocytosis but is afebrile now.  Fournier's gangrene: Underwent I&D on 11/1 and 11/3.  General surgery managing.Wound culture showing  mixed organisms and also showed few actinomyces.  On PCA pump for pain control.  ID recommended Augmentin and doxycycline for 7 days and follow-up with ID clinic on 06/04/2020.  Elevated liver enzymes: Viral hepatitis panel negative. Crestor and zetia held, we will recommend holding until significant improvement in the liver function.. Normal CK.discontinued clindamycin. Ultrasound of the liver showed fatty infiltration. Liver enzymes have significantly improved.  Prediabetes with hyperglycemia: Elevated blood sugars.  Not on antihyperglycemic's at  home.  Hemoglobin A1c of 6.3.  Glucose elevated due to sepsis.  Continue sliding-scale insulin.  Coronary artery disease: Currently stable.  Status post PCI artery.  On Crestor, Zetia and metoprolol at home.  Takes Plavix at home which is on hold. Must resume  if there is no further plan for surgical intervention.  Hyponatremia: Mild.  Continue to monitor         DVT prophylaxis:Lovenox Code Status: Full Family Communication: Discussed with wife at the bedside on 05/29/20 Status is: Inpatient    Procedures: I&D  Antimicrobials:  Anti-infectives (From admission, onward)   Start     Dose/Rate Route Frequency Ordered Stop   05/29/20 1200  doxycycline (VIBRA-TABS) tablet 100 mg        100 mg Oral 2 times daily with meals 05/29/20 1018     05/29/20 1115  amoxicillin-clavulanate (AUGMENTIN) 875-125 MG per tablet 1 tablet        1 tablet Oral Every 12 hours 05/29/20 1018     05/25/20 2200  vancomycin (VANCOCIN) IVPB 1000 mg/200 mL premix  Status:  Discontinued        1,000 mg 200 mL/hr over 60 Minutes Intravenous Every 12 hours 05/25/20 1626 05/29/20 1018   05/25/20 2000  clindamycin (CLEOCIN) IVPB 600 mg  Status:  Discontinued        600 mg 100 mL/hr over 30 Minutes Intravenous Every 6 hours 05/25/20 1838 05/28/20 0749   05/25/20 2000  piperacillin-tazobactam (ZOSYN) IVPB 3.375 g  Status:  Discontinued        3.375 g 12.5 mL/hr over 240 Minutes Intravenous Every 8 hours 05/25/20 1838 05/29/20 1018   05/25/20 1245  clindamycin (CLEOCIN) IVPB 900 mg        900 mg 100 mL/hr over 30 Minutes Intravenous  Once 05/25/20 1235 05/25/20 1437   05/25/20 1015  vancomycin (VANCOREADY) IVPB 1750 mg/350 mL        1,750 mg 175 mL/hr over 120 Minutes Intravenous  Once 05/25/20 0925 05/25/20 1841   05/25/20 0930  piperacillin-tazobactam (ZOSYN) IVPB 3.375 g        3.375 g 100 mL/hr over 30 Minutes Intravenous  Once 05/25/20 0925 05/25/20 1022      Subjective:  Patient seen and examined at  the bedside this morning.  Hemodynamically stable.  Was trying to rest.  Denies any worsening of the pain.  Objective: Vitals:   05/29/20 2033 05/30/20 0000 05/30/20 0314 05/30/20 0753  BP: (!) 149/71 135/87 132/87 129/84  Pulse: 85     Resp: 15 14 18  (!) 24  Temp: 98.1 F (36.7 C) 98.3 F (36.8 C) 98.1 F (36.7 C) 98.2 F (36.8 C)  TempSrc: Oral Oral Oral Oral  SpO2: 100% 99% 100% 99%  Weight:      Height:        Intake/Output Summary (Last 24 hours) at 05/30/2020 0819 Last data filed at 05/30/2020 0754 Gross per 24 hour  Intake --  Output 2850 ml  Net -2850 ml   Filed Weights   05/25/20 1255 05/27/20 0835  Weight: 114.3 kg 114.3 kg    Examination:   General exam: Comfortable, obese HEENT:PERRL,Oral mucosa moist, Ear/Nose normal on gross exam Respiratory system: Bilateral equal air entry, normal vesicular breath sounds, no wheezes or crackles  Cardiovascular system: S1 & S2 heard, RRR. No JVD, murmurs, rubs, gallops or clicks. Gastrointestinal system: Abdomen is nondistended, soft and nontender. No organomegaly or masses felt. Normal bowel sounds heard. Central nervous system: Alert and oriented. No focal neurological deficits. Extremities: No edema, no clubbing ,no cyanosis Skin: no icterus ,no pallor MSK: Right groin wound with wound VAC     Data Reviewed: I have personally reviewed following labs and imaging studies  CBC: Recent Labs  Lab 05/26/20 0602 05/27/20 0149 05/28/20 0100 05/29/20 0306 05/30/20 0602  WBC 20.7* 18.5* 16.5* 17.2* 14.9*  NEUTROABS  --   --  12.9* 13.4* 12.6*  HGB 12.4* 11.3* 11.7* 12.1* 12.8*  HCT 37.7* 34.9* 35.5* 36.7* 38.9*  MCV 90.0 91.1 91.0 91.3 88.0  PLT 219 237 246 247 233   Basic Metabolic Panel: Recent Labs  Lab 05/26/20 0602 05/27/20 0149 05/28/20 0100 05/29/20 0306 05/30/20 0602  NA 134* 134* 135 132* 129*  K 4.2 3.7 3.9 3.6 4.2  CL 101 97* 99 96* 98  CO2 26 30 29 29 23   GLUCOSE 247* 141* 117* 138* 117*   BUN 18 20 13 11 10   CREATININE 0.90 0.93 0.81 0.86 0.73  CALCIUM 7.7* 7.6* 7.8* 8.0* 8.1*   GFR: Estimated Creatinine Clearance: 143.5 mL/min (by C-G formula based on SCr of 0.73 mg/dL). Liver Function Tests: Recent Labs  Lab 05/26/20 0602 05/27/20 0149 05/28/20 0100 05/29/20 0306 05/30/20 0602  AST 72* 180* 256* 156* 51*  ALT 151* 215* 339* 298* 194*  ALKPHOS 95 91 109 120 125  BILITOT 0.9 0.8 1.2 1.4* 1.5*  PROT 5.9* 5.5* 5.9* 6.6 7.5  ALBUMIN 2.0* 1.7* 1.9* 1.9* 2.0*   No results for input(s): LIPASE, AMYLASE in the last 168 hours. No results for input(s): AMMONIA in the last 168 hours. Coagulation Profile: No results for input(s): INR, PROTIME in the last  168 hours. Cardiac Enzymes: Recent Labs  Lab 05/27/20 1148  CKTOTAL 50   BNP (last 3 results) No results for input(s): PROBNP in the last 8760 hours. HbA1C: No results for input(s): HGBA1C in the last 72 hours. CBG: Recent Labs  Lab 05/29/20 0729 05/29/20 1137 05/29/20 1605 05/29/20 2030 05/30/20 0744  GLUCAP 126* 112* 127* 116* 134*   Lipid Profile: No results for input(s): CHOL, HDL, LDLCALC, TRIG, CHOLHDL, LDLDIRECT in the last 72 hours. Thyroid Function Tests: No results for input(s): TSH, T4TOTAL, FREET4, T3FREE, THYROIDAB in the last 72 hours. Anemia Panel: No results for input(s): VITAMINB12, FOLATE, FERRITIN, TIBC, IRON, RETICCTPCT in the last 72 hours. Sepsis Labs: Recent Labs  Lab 05/25/20 0357 05/25/20 0933  LATICACIDVEN 1.8 1.6    Recent Results (from the past 240 hour(s))  COVID-19, Flu A+B and RSV ([facility])     Status: None   Collection Time: 05/20/20 10:20 AM  Result Value Ref Range Status   SARS-CoV-2, NAA Not Detected Not Detected Final    Comment: This nucleic acid amplification test was developed and its performance characteristics determined by [employer]. Nucleic acid amplification tests include RT-PCR and TMA. This test has not been FDA cleared or approved. This  test has been authorized by FDA under an Emergency Use Authorization (EUA). This test is only authorized for the duration of time the declaration that circumstances exist justifying the authorization of the emergency use of in vitro diagnostic tests for detection of SARS-CoV-2 virus and/or diagnosis of COVID-19 infection under section 564(b)(1) of the Act, 21 U.S.C. 119JYN-8(G) (1), unless the authorization is terminated or revoked sooner. When diagnostic testing is negative, the possibility of a false negative result should be considered in the context of a patient's recent exposures and the presence of clinical signs and symptoms consistent with COVID-19. An individual without symptoms of COVID-19 and who is not shedding SARS-CoV-2 virus wo uld expect to have a negative (not detected) result in this assay.    Influenza A, NAA Not Detected Not Detected Final   Influenza B, NAA Not Detected Not Detected Final   RSV, NAA Not Detected Not Detected Final  Blood culture (routine x 2)     Status: None   Collection Time: 05/25/20  7:38 AM   Specimen: BLOOD  Result Value Ref Range Status   Specimen Description BLOOD LEFT ANTECUBITAL  Final   Special Requests   Final    BOTTLES DRAWN AEROBIC AND ANAEROBIC Blood Culture adequate volume   Culture   Final    NO GROWTH 5 DAYS Performed at [facility], [address], [place], [place] [address]    Report Status 05/30/2020 FINAL  Final  Blood culture (routine x 2)     Status: None   Collection Time: 05/25/20  9:33 AM   Specimen: BLOOD  Result Value Ref Range Status   Specimen Description BLOOD RIGHT ANTECUBITAL  Final   Special Requests   Final    BOTTLES DRAWN AEROBIC AND ANAEROBIC Blood Culture adequate volume   Culture   Final    NO GROWTH 5 DAYS Performed at [facility], [address], [place], [place] [address]    Report Status 05/30/2020 FINAL  Final  Respiratory Panel by RT PCR (Flu A&B, Covid) - Nasopharyngeal Swab      Status: None   Collection Time: 05/25/20 10:06 AM   Specimen: Nasopharyngeal Swab  Result Value Ref Range Status   SARS Coronavirus 2 by RT PCR  NEGATIVE NEGATIVE Final    Comment: (NOTE) SARS-CoV-2 target nucleic acids are NOT DETECTED.  The SARS-CoV-2 RNA is generally detectable in upper respiratoy specimens during the acute phase of infection. The lowest concentration of SARS-CoV-2 viral copies this assay can detect is 131 copies/mL. A negative result does not preclude SARS-Cov-2 infection and should not be used as the sole basis for treatment or other patient management decisions. A negative result may occur with  improper specimen collection/handling, submission of specimen other than nasopharyngeal swab, presence of viral mutation(s) within the areas targeted by this assay, and inadequate number of viral copies (<131 copies/mL). A negative result must be combined with clinical observations, patient history, and epidemiological information. The expected result is Negative.  Fact Sheet for Patients:  https://www.moore.com/https://www.fda.gov/media/142436/download  Fact Sheet for Healthcare Providers:  https://www.young.biz/https://www.fda.gov/media/142435/download  This test is no t yet approved or cleared by the [place] FDA and  has been authorized for detection and/or diagnosis of SARS-CoV-2 by FDA under an Emergency Use Authorization (EUA). This EUA will remain  in effect (meaning this test can be used) for the duration of the COVID-19 declaration under Section 564(b)(1) of the Act, 21 U.S.C. section 360bbb-3(b)(1), unless the authorization is terminated or revoked sooner.     Influenza A by PCR NEGATIVE NEGATIVE Final   Influenza B by PCR NEGATIVE NEGATIVE Final    Comment: (NOTE) The Xpert Xpress SARS-CoV-2/FLU/RSV assay is intended as an aid in  the diagnosis of influenza from Nasopharyngeal swab specimens and  should not be used as a sole basis for treatment. Nasal washings and  aspirates are  unacceptable for Xpert Xpress SARS-CoV-2/FLU/RSV  testing.  Fact Sheet for Patients: https://www.moore.com/https://www.fda.gov/media/142436/download  Fact Sheet for Healthcare Providers: https://www.young.biz/https://www.fda.gov/media/142435/download  This test is not yet approved or cleared by the [place] FDA and  has been authorized for detection and/or diagnosis of SARS-CoV-2 by  FDA under an Emergency Use Authorization (EUA). This EUA will remain  in effect (meaning this test can be used) for the duration of the  Covid-19 declaration under Section 564(b)(1) of the Act, 21  U.S.C. section 360bbb-3(b)(1), unless the authorization is  terminated or revoked. Performed at [facility], [address] [place], [place] [address]   Aerobic/Anaerobic Culture (surgical/deep wound)     Status: None (Preliminary result)   Collection Time: 05/25/20  2:17 PM   Specimen: PATH Other; Tissue  Result Value Ref Range Status   Specimen Description ABSCESS SCROTUM  Final   Special Requests PT ON CLINDAMYCIN  Final   Gram Stain   Final    RARE WBC PRESENT,BOTH PMN AND MONONUCLEAR ABUNDANT GRAM POSITIVE COCCI MODERATE GRAM NEGATIVE RODS FEW GRAM POSITIVE RODS Performed at [facility], [address] [place], [place] [address]    Culture   Final    FEW ACTINOMYCES SPECIES NO ANAEROBES ISOLATED; CULTURE IN PROGRESS FOR 5 DAYS    Report Status PENDING  Incomplete         Radiology Studies: US LIVER DOPPLER  Result Date: 05/28/2020 CLINICAL DATA:  Elevated LFTs EXAM: DUPLEX ULTRASOUND OF LIVER TECHNIQUE: Color and duplex Doppler ultrasound was performed to evaluate the hepatic in-flow and out-flow vessels. COMPARISON:  None. FINDINGS: Liver: Mildly increased and heterogeneous normal hepatic contour without nodularity. No focal lesion, mass or intrahepatic biliary ductal dilatation. Main Portal Vein size: 0.8 cm Portal Vein Velocities Main Prox:  21 cm/sec Main Mid: 21 cm/sec Main Dist:  22 cm/sec Right: 15 cm/sec  Left: 24  cm/sec Hepatic Vein Velocities Right:  16 cm/sec Middle:  29 cm/sec Left:  31 cm/sec IVC: Present and patent with normal respiratory phasicity. Hepatic Artery Velocity:  109 cm/sec Splenic Vein Velocity:  24 cm/sec Spleen: 8.3 cm x 11.1 cm x 3.2 cm with a total volume of 152 cm^3 (411 cm^3 is upper limit normal) Portal Vein Occlusion/Thrombus: No Splenic Vein Occlusion/Thrombus: No Ascites: None Varices: None Hepatic, portal and splenic veins are patent with normal directional flow. Negative for portal vein occlusion or thrombus. No ascites. IMPRESSION: Normal hepatic venous Doppler. Electronically Signed   By: Judie Petit.  Shick M.D.   On: 05/28/2020 15:39   US Abdomen Limited RUQ (LIVER/GB)  Result Date: 05/28/2020 CLINICAL DATA:  Elevated LFTs EXAM: ULTRASOUND ABDOMEN LIMITED RIGHT UPPER QUADRANT COMPARISON:  CT from 05/25/2020 FINDINGS: Gallbladder: No gallstones or wall thickening visualized. No sonographic Murphy sign noted by sonographer. Common bile duct: Diameter: 3.8 mm. Liver: Mild increased echogenicity is noted without focal mass. Portal vein is patent on color Doppler imaging with normal direction of blood flow towards the liver. Other: None. IMPRESSION: Mild increased echogenicity within the liver which may be related to fatty infiltration. No other focal abnormality is noted. Electronically Signed   By: Alcide Clever M.D.   On: 05/28/2020 17:20        Scheduled Meds: . acetaminophen  650 mg Oral Q6H   Or  . acetaminophen  650 mg Rectal Q6H  . amoxicillin-clavulanate  1 tablet Oral Q12H  . Chlorhexidine Gluconate Cloth  6 each Topical Daily  . docusate sodium  100 mg Oral BID  . doxycycline  100 mg Oral BID WC  . insulin aspart  0-15 Units Subcutaneous TID WC  . insulin aspart  0-5 Units Subcutaneous QHS  . methocarbamol  500 mg Oral Q6H  . metoprolol tartrate  25 mg Oral BID  . mupirocin ointment  1 application Nasal BID  . polyethylene glycol  17 g Oral Daily   Continuous  Infusions:    LOS: 5 days    Time spent:25 mins. More than 50% of that time was spent in counseling and/or coordination of care.      Burnadette Pop, MD [facility] P11/12/2019, 8:19 AM

## 2020-05-30 NOTE — Progress Notes (Signed)
[facility] Progress Note  3 Days Post-Op  Subjective: CC-  Fiance at bedside. Tired this morning. Overall feeling ok. Pain manageable at rest. WBC down 14.9, afebrile. Antibiotics switched to augmentin and doxycycline.  Objective: Vital signs in last 24 hours: Temp:  [98.1 F (36.7 C)-98.7 F (37.1 C)] 98.2 F (36.8 C) (11/06 0753) Pulse Rate:  [85] 85 (11/05 2033) Resp:  [13-24] 24 (11/06 0753) BP: (129-149)/(71-87) 129/84 (11/06 0753) SpO2:  [99 %-100 %] 99 % (11/06 0753) Last BM Date: 05/29/20  Intake/Output from previous day: 11/05 0701 - 11/06 0700 In: -  Out: 2950 [Urine:2950] Intake/Output this shift: Total I/O In: -  Out: 450 [Urine:450]  PE: Gen: Alert, NAD, pleasant Pulm: rate and effort normal Abd: Soft, NT/ND Skin: warm and dry GU: large right groin wound extending down into right hemiscrotum with wound vac in place with good seal  Lab Results:  Recent Labs    05/29/20 0306 05/30/20 0602  WBC 17.2* 14.9*  HGB 12.1* 12.8*  HCT 36.7* 38.9*  PLT 247 233   BMET Recent Labs    05/29/20 0306 05/30/20 0602  NA 132* 129*  K 3.6 4.2  CL 96* 98  CO2 29 23  GLUCOSE 138* 117*  BUN 11 10  CREATININE 0.86 0.73  CALCIUM 8.0* 8.1*   PT/INR No results for input(s): LABPROT, INR in the last 72 hours. CMP     Component Value Date/Time   NA 129 (L) 05/30/2020 0602   K 4.2 05/30/2020 0602   CL 98 05/30/2020 0602   CO2 23 05/30/2020 0602   GLUCOSE 117 (H) 05/30/2020 0602   BUN 10 05/30/2020 0602   CREATININE 0.73 05/30/2020 0602   CALCIUM 8.1 (L) 05/30/2020 0602   PROT 7.5 05/30/2020 0602   PROT 6.6 08/07/2019 0844   ALBUMIN 2.0 (L) 05/30/2020 0602   ALBUMIN 4.2 08/07/2019 0844   AST 51 (H) 05/30/2020 0602   ALT 194 (H) 05/30/2020 0602   ALKPHOS 125 05/30/2020 0602   BILITOT 1.5 (H) 05/30/2020 0602   BILITOT 0.8 08/07/2019 0844   GFRNONAA >60 05/30/2020 0602   GFRAA >60 01/03/2016 1104   Lipase     Component Value  Date/Time   LIPASE 18 04/21/2010 0750       Studies/Results: US LIVER DOPPLER  Result Date: 05/28/2020 CLINICAL DATA:  Elevated LFTs EXAM: DUPLEX ULTRASOUND OF LIVER TECHNIQUE: Color and duplex Doppler ultrasound was performed to evaluate the hepatic in-flow and out-flow vessels. COMPARISON:  None. FINDINGS: Liver: Mildly increased and heterogeneous normal hepatic contour without nodularity. No focal lesion, mass or intrahepatic biliary ductal dilatation. Main Portal Vein size: 0.8 cm Portal Vein Velocities Main Prox:  21 cm/sec Main Mid: 21 cm/sec Main Dist:  22 cm/sec Right: 15 cm/sec Left: 24 cm/sec Hepatic Vein Velocities Right:  16 cm/sec Middle:  29 cm/sec Left:  31 cm/sec IVC: Present and patent with normal respiratory phasicity. Hepatic Artery Velocity:  109 cm/sec Splenic Vein Velocity:  24 cm/sec Spleen: 8.3 cm x 11.1 cm x 3.2 cm with a total volume of 152 cm^3 (411 cm^3 is upper limit normal) Portal Vein Occlusion/Thrombus: No Splenic Vein Occlusion/Thrombus: No Ascites: None Varices: None Hepatic, portal and splenic veins are patent with normal directional flow. Negative for portal vein occlusion or thrombus. No ascites. IMPRESSION: Normal hepatic venous Doppler. Electronically Signed   By: Judie Petit.  Shick M.D.   On: 05/28/2020 15:39   US Abdomen Limited RUQ (LIVER/GB)  Result Date: 05/28/2020 CLINICAL DATA:  Elevated LFTs EXAM: ULTRASOUND ABDOMEN LIMITED RIGHT UPPER QUADRANT COMPARISON:  CT from 05/25/2020 FINDINGS: Gallbladder: No gallstones or wall thickening visualized. No sonographic Murphy sign noted by sonographer. Common bile duct: Diameter: 3.8 mm. Liver: Mild increased echogenicity is noted without focal mass. Portal vein is patent on color Doppler imaging with normal direction of blood flow towards the liver. Other: None. IMPRESSION: Mild increased echogenicity within the liver which may be related to fatty infiltration. No other focal abnormality is noted. Electronically Signed   By:  Alcide Clever M.D.   On: 05/28/2020 17:20    Anti-infectives: Anti-infectives (From admission, onward)   Start     Dose/Rate Route Frequency Ordered Stop   05/29/20 1200  doxycycline (VIBRA-TABS) tablet 100 mg        100 mg Oral 2 times daily with meals 05/29/20 1018     05/29/20 1115  amoxicillin-clavulanate (AUGMENTIN) 875-125 MG per tablet 1 tablet        1 tablet Oral Every 12 hours 05/29/20 1018     05/25/20 2200  vancomycin (VANCOCIN) IVPB 1000 mg/200 mL premix  Status:  Discontinued        1,000 mg 200 mL/hr over 60 Minutes Intravenous Every 12 hours 05/25/20 1626 05/29/20 1018   05/25/20 2000  clindamycin (CLEOCIN) IVPB 600 mg  Status:  Discontinued        600 mg 100 mL/hr over 30 Minutes Intravenous Every 6 hours 05/25/20 1838 05/28/20 0749   05/25/20 2000  piperacillin-tazobactam (ZOSYN) IVPB 3.375 g  Status:  Discontinued        3.375 g 12.5 mL/hr over 240 Minutes Intravenous Every 8 hours 05/25/20 1838 05/29/20 1018   05/25/20 1245  clindamycin (CLEOCIN) IVPB 900 mg        900 mg 100 mL/hr over 30 Minutes Intravenous  Once 05/25/20 1235 05/25/20 1437   05/25/20 1015  vancomycin (VANCOREADY) IVPB 1750 mg/350 mL        1,750 mg 175 mL/hr over 120 Minutes Intravenous  Once 05/25/20 0925 05/25/20 1841   05/25/20 0930  piperacillin-tazobactam (ZOSYN) IVPB 3.375 g        3.375 g 100 mL/hr over 30 Minutes Intravenous  Once 05/25/20 0925 05/25/20 1022       Assessment/Plan 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 Bedelia Person POD#3S/pincision and debridement of right groin and scrotum, negative pressure wound vac application 34cmx15cmx9cm11/3 Dr. Bedelia Person - cx with few actinomyces, ID c/s. Plan  for 7d course of doxy/aug and f/u with ID 11/11 for possible 3-22m course of abx for actino -path: no malignancy seen - Pain control with optimization of PO.  - Vac MWF although it doesn't look like his insurance is going to approve this. Continue vac over weekend, plan to transition to wet to dry dressing change Monday if vac does not get approved.  - Continue mobilizing  ID -clinda 11/1>>11/4,zosyn/vancomycin 11/1>>11/5, augmentin/doxycycline 11/5>>. Add magic mouthwash for oral trush FEN - CM diet VTE -lovenox Foley -out, voiding Follow up -TBD    LOS: 5 days    Franne Forts, [facility] 05/30/2020, 8:48 AM Please see Amion for pager number during day hours 7:00am-4:30pm

## 2020-05-30 NOTE — Progress Notes (Signed)
Initial Nutrition Assessment  DOCUMENTATION CODES:   Obesity unspecified  INTERVENTION:  Ensure Max po BID, each supplement provides 150 kcal and 30 grams of protein  Prosource Plus, 30 ml po BID, each supplement provides 100 kcal and 15 grams of protein  Juven BID, each packet provides 95 calories, 2.5 grams of protein (collagen), and 9.8 grams of carbohydrate (3 grams sugar); also contains 7 grams of L-arginine and L-glutamine, 300 mg vitamin C, 15 mg vitamin E, 1.2 mcg vitamin B-12, 9.5 mg zinc, 200 mg calcium, and 1.5 g  Calcium Beta-hydroxy-Beta-methylbutyrate to support wound healing  MVI with minerals daily  DM2 diet education provided  NUTRITION DIAGNOSIS:   Increased nutrient needs related to post-op healing as evidenced by estimated needs.   GOAL:   Patient will meet greater than or equal to 90% of their needs    MONITOR:   Labs, Supplement acceptance, PO intake, Weight trends, Skin, I & O's  REASON FOR ASSESSMENT:   Consult Assessment of nutrition requirement/status, Wound healing  ASSESSMENT:  RD working remotely.   54 year old male admitted for necrotizing soft tissue infection. Past medical history significant of CAD, STEMI s/p stenting x 4 on  Plavix, HLD, and obesity presented with worsening chills, body aches, and severe right groin pain with rash as well as swelling extending into right scrotum.  11/1-I&D of right groin and scrotum 11/3-I&D of right groin and scrotum, negative pressure wound vac   RD spoke with pt via phone this morning. He reports decreased appetite, endorsed some episodes of diarrhea after po intake. Diarrhea likely related to antibiotics, suggested soft foods that are easy to digest. Educated on increased calorie/protein needs and the importance adequate intake to support wound healing. Patient is agreeable to trying Ensure Max. Will also provide ProSource to aid with meeting needs as well as Juven to support wound healing.   Meal  completion 75% x 1 documented meal  Patient with new DM2 diagnosis this admission. He reports normal appetite at baseline, recalls usually eating 2 meals daily. Discussed importance of controlled and consistent carbohydrate intake throughout the day. Provided examples of ways to balance meals/snacks and encouraged intake of high-fiber, whole grain complex carbohydrates. RD has attatched "Heart Healthy Consistent Carbohydrate Nutrition Therapy" handout from Academy of [employer] to discharge instructions.  Per chart, weights stable over the last 10 months.  Medications reviewed and include: Augmentin, Colace,Miralax, Doxycycline, SSI  Labs: CBGs 134,116,127,112, Na 129 (L) 11/2 A1c 6.3  NUTRITION - FOCUSED PHYSICAL EXAM: Unable to complete at this time, RD working remotely.  Diet Order:   Diet Order            Diet Carb Modified Fluid consistency: Thin; Room service appropriate? Yes  Diet effective now                 EDUCATION NEEDS:   Education needs have been addressed  Skin:  Skin Assessment: Skin Integrity Issues: Skin Integrity Issues:: Incisions, Wound VAC Wound Vac: groin Incisions: closed; groin  Last BM:  11/6-type 6  Height:   Ht Readings from Last 1 Encounters:  05/27/20 6' 2.02" (1.88 m)    Weight:   Wt Readings from Last 1 Encounters:  05/27/20 114.3 kg    BMI:  Body mass index is 32.34 kg/m.  Estimated Nutritional Needs:   Kcal:  2500-2700  Protein:  125-135  Fluid:  >/= 2.5 L/day   Lars Masson, RD, LDN Clinical Nutrition After Hours/Weekend Pager # in Amion

## 2020-05-30 NOTE — TOC Progression Note (Signed)
Transition of Care [facility]) - Progression Note    Patient Details  Name: Adam Haney MRN: [id number] Date of Birth: October 07, 1965  Transition [facility]) CM/SW Contact  Lawerance Sabal, RN Phone Number: 05/30/2020, 4:59 PM  Clinical Narrative:   Attempted to arrange [facility] services through [facility], they were unable to accept.     Expected Discharge Plan: Home w Home Health Services Barriers to Discharge: Continued Medical Work up, No Home Care Agency will accept this patient, Equipment Delay  Expected Discharge Plan and Services Expected Discharge Plan: Home w Home Health Services   Discharge Planning Services: CM Consult Post Acute Care Choice: Home Health, Durable Medical Equipment Living arrangements for the past 2 months: Single Family Home                                       Social Determinants of Health (SDOH) Interventions    Readmission Risk Interventions No flowsheet data found.

## 2020-05-30 NOTE — Progress Notes (Signed)
OT Cancellation Note  Patient Details Name: Adam Haney MRN: [id number] DOB: 1965-08-10   Cancelled Treatment:    Reason Eval/Treat Not Completed: Pain limiting ability to participate;Patient declined, no reason specified; pt reports he's already been up "a couple times this morning" reports too fatigued too attempt further activity at this time. Agreeable to OT checking back as able. Will follow up as schedule permits for OT eval.  Marcy Siren, OT Acute Rehabilitation Services Pager [phone] Office [phone]   Orlando Penner 05/30/2020, 2:08 PM

## 2020-05-31 LAB — CBC WITH DIFFERENTIAL/PLATELET
Abs Immature Granulocytes: 0.17 10*3/uL — ABNORMAL HIGH (ref 0.00–0.07)
Basophils Absolute: 0 10*3/uL (ref 0.0–0.1)
Basophils Relative: 0 %
Eosinophils Absolute: 0.1 10*3/uL (ref 0.0–0.5)
Eosinophils Relative: 1 %
HCT: 38.3 % — ABNORMAL LOW (ref 39.0–52.0)
Hemoglobin: 12.7 g/dL — ABNORMAL LOW (ref 13.0–17.0)
Immature Granulocytes: 2 %
Lymphocytes Relative: 11 %
Lymphs Abs: 1.1 10*3/uL (ref 0.7–4.0)
MCH: 29.4 pg (ref 26.0–34.0)
MCHC: 33.2 g/dL (ref 30.0–36.0)
MCV: 88.7 fL (ref 80.0–100.0)
Monocytes Absolute: 0.6 10*3/uL (ref 0.1–1.0)
Monocytes Relative: 6 %
Neutro Abs: 8.8 10*3/uL — ABNORMAL HIGH (ref 1.7–7.7)
Neutrophils Relative %: 80 %
Platelets: 225 10*3/uL (ref 150–400)
RBC: 4.32 MIL/uL (ref 4.22–5.81)
RDW: 13.6 % (ref 11.5–15.5)
WBC: 10.8 10*3/uL — ABNORMAL HIGH (ref 4.0–10.5)
nRBC: 0 % (ref 0.0–0.2)

## 2020-05-31 LAB — GLUCOSE, CAPILLARY
Glucose-Capillary: 111 mg/dL — ABNORMAL HIGH (ref 70–99)
Glucose-Capillary: 149 mg/dL — ABNORMAL HIGH (ref 70–99)
Glucose-Capillary: 87 mg/dL (ref 70–99)
Glucose-Capillary: 155 mg/dL — ABNORMAL HIGH (ref 70–99)

## 2020-05-31 LAB — BASIC METABOLIC PANEL
Anion gap: 8 (ref 5–15)
BUN: 13 mg/dL (ref 6–20)
CO2: 25 mmol/L (ref 22–32)
Calcium: 8.2 mg/dL — ABNORMAL LOW (ref 8.9–10.3)
Chloride: 97 mmol/L — ABNORMAL LOW (ref 98–111)
Creatinine, Ser: 0.76 mg/dL (ref 0.61–1.24)
GFR, Estimated: >60 mL/min (ref >60)
Glucose, Bld: 132 mg/dL — ABNORMAL HIGH (ref 70–99)
Potassium: 3.9 mmol/L (ref 3.5–5.1)
Sodium: 130 mmol/L — ABNORMAL LOW (ref 135–145)

## 2020-05-31 LAB — AEROBIC/ANAEROBIC CULTURE W GRAM STAIN (SURGICAL/DEEP WOUND)

## 2020-05-31 MED ORDER — SACCHAROMYCES BOULARDII 250 MG PO CAPS
250.0000 mg | ORAL_CAPSULE | Freq: Two times a day (BID) | ORAL | Status: DC
  Administered 2020-05-31 – 2020-06-02 (×5): 250 mg via ORAL
  Filled 2020-05-31 (×5): qty 1

## 2020-05-31 NOTE — TOC Progression Note (Signed)
Transition of Care [facility]) - Progression Note    Patient Details  Name: Adam Haney MRN: [id number] Date of Birth: 23-Nov-1965  Transition of Care [facility]) CM/SW Contact  Lawerance Sabal, RN Phone Number: 05/31/2020, 11:43 AM  Clinical Narrative:   Patient accepted by [facility] for [facility] RN PT services. [facility] liaison aware that patient will need wound VAC changes MWF. Patient will need VAC change on Monday prior to DC, services can begin at home Wednesday. Liaison states that they will have to run insurance, as there may be a co-pay, however patient was inquiring about self paying for [facility] services to be able to go home, so I don't anticipate the co-pay to be a barrier.     Expected Discharge Plan: Home w Home Health Services Barriers to Discharge: Continued Medical Work up, No Home Care Agency will accept this patient, Equipment Delay  Expected Discharge Plan and Services Expected Discharge Plan: Home w Home Health Services   Discharge Planning Services: CM Consult Post Acute Care Choice: Home Health, Durable Medical Equipment Living arrangements for the past 2 months: Single Family Home                           HH Arranged: RN, PT [facility] Agency: [facility] Date HH Agency Contacted: 05/31/20 Time HH Agency Contacted: 1143 Representative spoke with at [facility] Agency: weekend coverage, Bonita Quin   Social Determinants of Health (SDOH) Interventions    Readmission Risk Interventions No flowsheet data found.

## 2020-05-31 NOTE — Progress Notes (Signed)
PROGRESS NOTE    Adam Haney  ZOX:[id number] DOB: March 16, 1966 DOA: 05/25/2020 PCP: [name], [name], MD   Chief Complain: Fever, chills, groin pain  Brief Narrative:  Patient is a 54 year old male with history of diabetes type 2, coronary artery  disease status post PCI, obesity who presented with fever, chills and groin pain from home.  On presentation, CT showed soft tissue infection in the groin with edema and gas, elevated leukocytosis and he was febrile presentation.  Was suspected to have Fournier's gangrene and was seen by general surgery and was taken to the OR for I&D of the right groin and scrotum. Underwent another I&D on 05/27/20.  TRH is consulting for medical assistance.  Assessment & Plan:   Principal Problem:   Necrotizing soft tissue infection Active Problems:   Tobacco abuse   Coronary atherosclerosis of native coronary artery   Hyperlipidemia   Hyperglycemia   Class 1 obesity due to excess calories with body mass index (BMI) of 32.0 to 32.9 in adult   Severe sepsis: Presented with fever leukocytosis, tachycardia. Blood cultures NGTD.  Improved leukocytosis ,afebrile. Sepsis physiology has resolved  Fournier's gangrene: Underwent I&D on 11/1 and 11/3.  General surgery managing.Wound culture showing  mixed organisms and also showed few actinomyces. Continue  pain control.  ID recommended Augmentin and doxycycline for 7 days and follow-up with ID clinic on 06/04/2020.  Elevated liver enzymes: Viral hepatitis panel negative. Crestor and zetia held, we will recommend holding until significant improvement in the liver function.. Normal CK. Ultrasound of the liver showed fatty infiltration. Liver enzymes have significantly improved.Check CMP tomorrow  Prediabetes with hyperglycemia: Elevated blood sugars.  Not on antihyperglycemic's at home.  Hemoglobin A1c of 6.3.  Glucose elevated due to sepsis.  Continue sliding-scale insulin.  Coronary artery disease: Currently stable.   Status post PCI artery.  On Crestor, Zetia and metoprolol at home.  Takes Plavix at home which is on hold. Must resume  if there is no further plan for surgical intervention.  Hyponatremia: Mild.  Continue to monitor  Nutrition Problem: Increased nutrient needs Etiology: post-op healing      DVT prophylaxis:SCD Code Status: Full Family Communication: Discussed with wife at the bedside on 05/31/20 Status is: Inpatient    Procedures: I&D  Antimicrobials:  Anti-infectives (From admission, onward)   Start     Dose/Rate Route Frequency Ordered Stop   05/29/20 1200  doxycycline (VIBRA-TABS) tablet 100 mg        100 mg Oral 2 times daily with meals 05/29/20 1018     05/29/20 1115  amoxicillin-clavulanate (AUGMENTIN) 875-125 MG per tablet 1 tablet        1 tablet Oral Every 12 hours 05/29/20 1018     05/25/20 2200  vancomycin (VANCOCIN) IVPB 1000 mg/200 mL premix  Status:  Discontinued        1,000 mg 200 mL/hr over 60 Minutes Intravenous Every 12 hours 05/25/20 1626 05/29/20 1018   05/25/20 2000  clindamycin (CLEOCIN) IVPB 600 mg  Status:  Discontinued        600 mg 100 mL/hr over 30 Minutes Intravenous Every 6 hours 05/25/20 1838 05/28/20 0749   05/25/20 2000  piperacillin-tazobactam (ZOSYN) IVPB 3.375 g  Status:  Discontinued        3.375 g 12.5 mL/hr over 240 Minutes Intravenous Every 8 hours 05/25/20 1838 05/29/20 1018   05/25/20 1245  clindamycin (CLEOCIN) IVPB 900 mg        900 mg 100 mL/hr over  30 Minutes Intravenous  Once 05/25/20 1235 05/25/20 1437   05/25/20 1015  vancomycin (VANCOREADY) IVPB 1750 mg/350 mL        1,750 mg 175 mL/hr over 120 Minutes Intravenous  Once 05/25/20 0925 05/25/20 1841   05/25/20 0930  piperacillin-tazobactam (ZOSYN) IVPB 3.375 g        3.375 g 100 mL/hr over 30 Minutes Intravenous  Once 05/25/20 0925 05/25/20 1022      Subjective:  Patient seen and examined at the bedside this morning. Comfortable, sleeping. Wife at the bedside. No new  complaints  Objective: Vitals:   05/30/20 2330 05/31/20 0220 05/31/20 0300 05/31/20 0752  BP: 119/78  131/79   Pulse: 83  71 92  Resp: 18  18   Temp: 100 F (37.8 C) 98.2 F (36.8 C) 98.4 F (36.9 C)   TempSrc: Oral Oral Oral   SpO2: 100%  99%   Weight:      Height:        Intake/Output Summary (Last 24 hours) at 05/31/2020 0821 Last data filed at 05/31/2020 0220 Gross per 24 hour  Intake 570 ml  Output 1500 ml  Net -930 ml   Filed Weights   05/25/20 1255 05/27/20 0835  Weight: 114.3 kg 114.3 kg    Examination:   General exam: Comfortable, sleeping,full exam not done      Data Reviewed: I have personally reviewed following labs and imaging studies  CBC: Recent Labs  Lab 05/27/20 0149 05/28/20 0100 05/29/20 0306 05/30/20 0602 05/31/20 0602  WBC 18.5* 16.5* 17.2* 14.9* 10.8*  NEUTROABS  --  12.9* 13.4* 12.6* 8.8*  HGB 11.3* 11.7* 12.1* 12.8* 12.7*  HCT 34.9* 35.5* 36.7* 38.9* 38.3*  MCV 91.1 91.0 91.3 88.0 88.7  PLT 237 246 247 233 225   Basic Metabolic Panel: Recent Labs  Lab 05/27/20 0149 05/28/20 0100 05/29/20 0306 05/30/20 0602 05/31/20 0602  NA 134* 135 132* 129* 130*  K 3.7 3.9 3.6 4.2 3.9  CL 97* 99 96* 98 97*  CO2 30 29 29 23 25   GLUCOSE 141* 117* 138* 117* 132*  BUN 20 13 11 10 13   CREATININE 0.93 0.81 0.86 0.73 0.76  CALCIUM 7.6* 7.8* 8.0* 8.1* 8.2*   GFR: Estimated Creatinine Clearance: 143.5 mL/min (by C-G formula based on SCr of 0.76 mg/dL). Liver Function Tests: Recent Labs  Lab 05/26/20 0602 05/27/20 0149 05/28/20 0100 05/29/20 0306 05/30/20 0602  AST 72* 180* 256* 156* 51*  ALT 151* 215* 339* 298* 194*  ALKPHOS 95 91 109 120 125  BILITOT 0.9 0.8 1.2 1.4* 1.5*  PROT 5.9* 5.5* 5.9* 6.6 7.5  ALBUMIN 2.0* 1.7* 1.9* 1.9* 2.0*   No results for input(s): LIPASE, AMYLASE in the last 168 hours. No results for input(s): AMMONIA in the last 168 hours. Coagulation Profile: No results for input(s): INR, PROTIME in the last  168 hours. Cardiac Enzymes: Recent Labs  Lab 05/27/20 1148  CKTOTAL 50   BNP (last 3 results) No results for input(s): PROBNP in the last 8760 hours. HbA1C: No results for input(s): HGBA1C in the last 72 hours. CBG: Recent Labs  Lab 05/29/20 2030 05/30/20 0744 05/30/20 1253 05/30/20 1657 05/30/20 2114  GLUCAP 116* 134* 126* 123* 127*   Lipid Profile: No results for input(s): CHOL, HDL, LDLCALC, TRIG, CHOLHDL, LDLDIRECT in the last 72 hours. Thyroid Function Tests: No results for input(s): TSH, T4TOTAL, FREET4, T3FREE, THYROIDAB in the last 72 hours. Anemia Panel: No results for input(s): VITAMINB12, FOLATE, FERRITIN, TIBC,  IRON, RETICCTPCT in the last 72 hours. Sepsis Labs: Recent Labs  Lab 05/25/20 0357 05/25/20 0933  LATICACIDVEN 1.8 1.6    Recent Results (from the past 240 hour(s))  Blood culture (routine x 2)     Status: None   Collection Time: 05/25/20  7:38 AM   Specimen: BLOOD  Result Value Ref Range Status   Specimen Description BLOOD LEFT ANTECUBITAL  Final   Special Requests   Final    BOTTLES DRAWN AEROBIC AND ANAEROBIC Blood Culture adequate volume   Culture   Final    NO GROWTH 5 DAYS Performed at [facility], [address], [place], [place] [address]    Report Status 05/30/2020 FINAL  Final  Blood culture (routine x 2)     Status: None   Collection Time: 05/25/20  9:33 AM   Specimen: BLOOD  Result Value Ref Range Status   Specimen Description BLOOD RIGHT ANTECUBITAL  Final   Special Requests   Final    BOTTLES DRAWN AEROBIC AND ANAEROBIC Blood Culture adequate volume   Culture   Final    NO GROWTH 5 DAYS Performed at [facility], [address], [place], [place] [address]    Report Status 05/30/2020 FINAL  Final  Respiratory Panel by RT PCR (Flu A&B, Covid) - Nasopharyngeal Swab     Status: None   Collection Time: 05/25/20 10:06 AM   Specimen: Nasopharyngeal Swab  Result Value Ref Range Status   SARS Coronavirus 2 by RT PCR  NEGATIVE NEGATIVE Final    Comment: (NOTE) SARS-CoV-2 target nucleic acids are NOT DETECTED.  The SARS-CoV-2 RNA is generally detectable in upper respiratoy specimens during the acute phase of infection. The lowest concentration of SARS-CoV-2 viral copies this assay can detect is 131 copies/mL. A negative result does not preclude SARS-Cov-2 infection and should not be used as the sole basis for treatment or other patient management decisions. A negative result may occur with  improper specimen collection/handling, submission of specimen other than nasopharyngeal swab, presence of viral mutation(s) within the areas targeted by this assay, and inadequate number of viral copies (<131 copies/mL). A negative result must be combined with clinical observations, patient history, and epidemiological information. The expected result is Negative.  Fact Sheet for Patients:  https://www.moore.com/  Fact Sheet for Healthcare Providers:  https://www.young.biz/  This test is no t yet approved or cleared by the [place] FDA and  has been authorized for detection and/or diagnosis of SARS-CoV-2 by FDA under an Emergency Use Authorization (EUA). This EUA will remain  in effect (meaning this test can be used) for the duration of the COVID-19 declaration under Section 564(b)(1) of the Act, 21 U.S.C. section 360bbb-3(b)(1), unless the authorization is terminated or revoked sooner.     Influenza A by PCR NEGATIVE NEGATIVE Final   Influenza B by PCR NEGATIVE NEGATIVE Final    Comment: (NOTE) The Xpert Xpress SARS-CoV-2/FLU/RSV assay is intended as an aid in  the diagnosis of influenza from Nasopharyngeal swab specimens and  should not be used as a sole basis for treatment. Nasal washings and  aspirates are unacceptable for Xpert Xpress SARS-CoV-2/FLU/RSV  testing.  Fact Sheet for Patients: https://www.moore.com/  Fact Sheet for  Healthcare Providers: https://www.young.biz/  This test is not yet approved or cleared by the [place] FDA and  has been authorized for detection and/or diagnosis of SARS-CoV-2 by  FDA under an Emergency Use Authorization (EUA). This EUA will remain  in effect (meaning  this test can be used) for the duration of the  Covid-19 declaration under Section 564(b)(1) of the Act, 21  U.S.C. section 360bbb-3(b)(1), unless the authorization is  terminated or revoked. Performed at [facility], [address] [place], [place] [address]   Aerobic/Anaerobic Culture (surgical/deep wound)     Status: None (Preliminary result)   Collection Time: 05/25/20  2:17 PM   Specimen: PATH Other; Tissue  Result Value Ref Range Status   Specimen Description ABSCESS SCROTUM  Final   Special Requests PT ON CLINDAMYCIN  Final   Gram Stain   Final    RARE WBC PRESENT,BOTH PMN AND MONONUCLEAR ABUNDANT GRAM POSITIVE COCCI MODERATE GRAM NEGATIVE RODS FEW GRAM POSITIVE RODS    Culture   Final    FEW ACTINOMYCES SPECIES HOLDING FOR POSSIBLE ANAEROBE Performed at [facility], [address] [place], [place] [address]    Report Status PENDING  Incomplete         Radiology Studies: No results found.      Scheduled Meds: . (feeding supplement) PROSource Plus  30 mL Oral BID BM  . acetaminophen  650 mg Oral Q6H   Or  . acetaminophen  650 mg Rectal Q6H  . amoxicillin-clavulanate  1 tablet Oral Q12H  . Chlorhexidine Gluconate Cloth  6 each Topical Daily  . docusate sodium  100 mg Oral BID  . doxycycline  100 mg Oral BID WC  . insulin aspart  0-15 Units Subcutaneous TID WC  . insulin aspart  0-5 Units Subcutaneous QHS  . methocarbamol  500 mg Oral Q6H  . metoprolol tartrate  25 mg Oral BID  . multivitamin with minerals  1 tablet Oral Daily  . mupirocin ointment  1 application Nasal BID  . nutrition supplement (JUVEN)  1 packet Oral BID BM  . polyethylene glycol   17 g Oral Daily  . Ensure Max Protein  11 oz Oral BID  . saccharomyces boulardii  250 mg Oral BID   Continuous Infusions:    LOS: 6 days    Time spent:15 mins. More than 50% of that time was spent in counseling and/or coordination of care.      Burnadette Pop, MD [facility] P11/01/2020, 8:21 AM

## 2020-05-31 NOTE — Progress Notes (Signed)
[facility] Progress Note  4 Days Post-Op  Subjective: CC-  Feeling a little better today. Pain well controlled. Ambulated around room and to restroom ok. BM yesterday. Tolerating diet but still not eating a lot. WBC down 10.8, TMAX 100  Objective: Vital signs in last 24 hours: Temp:  [97.8 F (36.6 C)-100 F (37.8 C)] 98.4 F (36.9 C) (11/07 0300) Pulse Rate:  [71-92] 92 (11/07 0752) Resp:  [17-20] 18 (11/07 0300) BP: (119-149)/(78-90) 131/79 (11/07 0300) SpO2:  [99 %-100 %] 99 % (11/07 0300) Last BM Date: 05/30/20  Intake/Output from previous day: 11/06 0701 - 11/07 0700 In: 570 [P.O.:570] Out: 1950 [Urine:1950] Intake/Output this shift: No intake/output data recorded.  PE: Gen: Alert, NAD, pleasant Pulm: rate and effort normal Abd: Soft, NT/ND Skin: warm and dry GU: large right groin wound extending down into right hemiscrotumwith wound vac in place with good seal (vac with bloody drainage in canister)   Lab Results:  Recent Labs    05/30/20 0602 05/31/20 0602  WBC 14.9* 10.8*  HGB 12.8* 12.7*  HCT 38.9* 38.3*  PLT 233 225   BMET Recent Labs    05/30/20 0602 05/31/20 0602  NA 129* 130*  K 4.2 3.9  CL 98 97*  CO2 23 25  GLUCOSE 117* 132*  BUN 10 13  CREATININE 0.73 0.76  CALCIUM 8.1* 8.2*   PT/INR No results for input(s): LABPROT, INR in the last 72 hours. CMP     Component Value Date/Time   NA 130 (L) 05/31/2020 0602   K 3.9 05/31/2020 0602   CL 97 (L) 05/31/2020 0602   CO2 25 05/31/2020 0602   GLUCOSE 132 (H) 05/31/2020 0602   BUN 13 05/31/2020 0602   CREATININE 0.76 05/31/2020 0602   CALCIUM 8.2 (L) 05/31/2020 0602   PROT 7.5 05/30/2020 0602   PROT 6.6 08/07/2019 0844   ALBUMIN 2.0 (L) 05/30/2020 0602   ALBUMIN 4.2 08/07/2019 0844   AST 51 (H) 05/30/2020 0602   ALT 194 (H) 05/30/2020 0602   ALKPHOS 125 05/30/2020 0602   BILITOT 1.5 (H) 05/30/2020 0602   BILITOT 0.8 08/07/2019 0844   GFRNONAA >60 05/31/2020 0602    GFRAA >60 01/03/2016 1104   Lipase     Component Value Date/Time   LIPASE 18 04/21/2010 0750       Studies/Results: No results found.  Anti-infectives: Anti-infectives (From admission, onward)   Start     Dose/Rate Route Frequency Ordered Stop   05/29/20 1200  doxycycline (VIBRA-TABS) tablet 100 mg        100 mg Oral 2 times daily with meals 05/29/20 1018     05/29/20 1115  amoxicillin-clavulanate (AUGMENTIN) 875-125 MG per tablet 1 tablet        1 tablet Oral Every 12 hours 05/29/20 1018     05/25/20 2200  vancomycin (VANCOCIN) IVPB 1000 mg/200 mL premix  Status:  Discontinued        1,000 mg 200 mL/hr over 60 Minutes Intravenous Every 12 hours 05/25/20 1626 05/29/20 1018   05/25/20 2000  clindamycin (CLEOCIN) IVPB 600 mg  Status:  Discontinued        600 mg 100 mL/hr over 30 Minutes Intravenous Every 6 hours 05/25/20 1838 05/28/20 0749   05/25/20 2000  piperacillin-tazobactam (ZOSYN) IVPB 3.375 g  Status:  Discontinued        3.375 g 12.5 mL/hr over 240 Minutes Intravenous Every 8 hours 05/25/20 1838 05/29/20 1018   05/25/20 1245  clindamycin (CLEOCIN)  IVPB 900 mg        900 mg 100 mL/hr over 30 Minutes Intravenous  Once 05/25/20 1235 05/25/20 1437   05/25/20 1015  vancomycin (VANCOREADY) IVPB 1750 mg/350 mL        1,750 mg 175 mL/hr over 120 Minutes Intravenous  Once 05/25/20 0925 05/25/20 1841   05/25/20 0930  piperacillin-tazobactam (ZOSYN) IVPB 3.375 g        3.375 g 100 mL/hr over 30 Minutes Intravenous  Once 05/25/20 0925 05/25/20 1022       Assessment/Plan CAD with inferior STEMI s/p cardiac stent x4 in 2014 on plavix- hold plavix HLD Obesity DM-new diagnosis, A1c 6.3, SSI Elevated LFTs- hepatitis panelnegative, u/s and doppler ok just showed fatty liver --per TRH--  Necrotizing soft tissue infection POD#6S/pincision and debridement of right groin and scrotum11/1 Dr. Bedelia Person POD#4S/pincision and debridement of right groin and scrotum, negative  pressure wound vac application 34cmx15cmx9cm11/3 Dr. Bedelia Person -cx with few actinomyces, ID c/s. Plan for 7d course of doxy/aug and f/u with ID 11/11 for possible 3-73m course of abx for actino -path: no malignancy seen -Pain control with optimization of PO.  - Insurance is not going to cover for wound vac, family interested in information about self pay options. Will ask TOC team to address.  Plan to transition to wet to dry dressing changes tomorrow if vac not approved.  - Continue mobilizing  ID -clinda 11/1>>11/4,zosyn/vancomycin 11/1>>11/5, augmentin/doxycycline 11/5>>. magic mouthwash for oral trush FEN - CM diet VTE -lovenox Foley -out, voiding Follow up -TBD   LOS: 6 days    Franne Forts, [facility] 05/31/2020, 8:04 AM Please see Amion for pager number during day hours 7:00am-4:30pm

## 2020-06-01 LAB — COMPREHENSIVE METABOLIC PANEL
ALT: 180 U/L — ABNORMAL HIGH (ref 0–44)
AST: 93 U/L — ABNORMAL HIGH (ref 15–41)
Albumin: 2 g/dL — ABNORMAL LOW (ref 3.5–5.0)
Alkaline Phosphatase: 110 U/L (ref 38–126)
Anion gap: 9 (ref 5–15)
BUN: 15 mg/dL (ref 6–20)
CO2: 23 mmol/L (ref 22–32)
Calcium: 8 mg/dL — ABNORMAL LOW (ref 8.9–10.3)
Chloride: 99 mmol/L (ref 98–111)
Creatinine, Ser: 0.73 mg/dL (ref 0.61–1.24)
GFR, Estimated: >60 mL/min (ref >60)
Glucose, Bld: 122 mg/dL — ABNORMAL HIGH (ref 70–99)
Potassium: 4.1 mmol/L (ref 3.5–5.1)
Sodium: 131 mmol/L — ABNORMAL LOW (ref 135–145)
Total Bilirubin: 1.2 mg/dL (ref 0.3–1.2)
Total Protein: 7.7 g/dL (ref 6.5–8.1)

## 2020-06-01 LAB — GLUCOSE, CAPILLARY
Glucose-Capillary: 131 mg/dL — ABNORMAL HIGH (ref 70–99)
Glucose-Capillary: 171 mg/dL — ABNORMAL HIGH (ref 70–99)
Glucose-Capillary: 109 mg/dL — ABNORMAL HIGH (ref 70–99)
Glucose-Capillary: 132 mg/dL — ABNORMAL HIGH (ref 70–99)

## 2020-06-01 MED ORDER — SACCHAROMYCES BOULARDII 250 MG PO CAPS
250.0000 mg | ORAL_CAPSULE | Freq: Two times a day (BID) | ORAL | Status: DC
Start: 2020-06-01 — End: 2020-11-13

## 2020-06-01 MED ORDER — DOXYCYCLINE HYCLATE 100 MG PO TABS: 100.0000 mg | ORAL_TABLET | Freq: Two times a day (BID) | ORAL | 0 refills | Status: DC

## 2020-06-01 MED ORDER — AMOXICILLIN-POT CLAVULANATE 875-125 MG PO TABS: 1.0000 | ORAL_TABLET | Freq: Two times a day (BID) | ORAL | 0 refills | Status: DC

## 2020-06-01 MED ORDER — OXYCODONE HCL 10 MG PO TABS
5.0000 mg | ORAL_TABLET | Freq: Four times a day (QID) | ORAL | 0 refills | Status: DC | PRN
Start: 2020-06-01 — End: 2020-08-06

## 2020-06-01 MED ORDER — METHOCARBAMOL 500 MG PO TABS: 500.0000 mg | ORAL_TABLET | Freq: Four times a day (QID) | ORAL | 0 refills | Status: DC | PRN

## 2020-06-01 MED ORDER — MAGIC MOUTHWASH W/LIDOCAINE: 10.0000 mL | Freq: Four times a day (QID) | ORAL | 0 refills | Status: DC | PRN

## 2020-06-01 MED ORDER — ACETAMINOPHEN 500 MG PO TABS: 1000.0000 mg | ORAL_TABLET | Freq: Four times a day (QID) | ORAL | Status: AC

## 2020-06-01 MED ORDER — POLYETHYLENE GLYCOL 3350 17 G PO PACK
17.0000 g | PACK | Freq: Every day | ORAL | 0 refills | Status: DC
Start: 2020-06-02 — End: 2020-08-06

## 2020-06-01 NOTE — Discharge Summary (Signed)
[facility] Discharge Summary   Patient ID: Adam Haney MRN: [id number] DOB/AGE: [birth year] 54 y.o.  Admit date: 05/25/2020 Discharge date: 06/01/2020  Admitting Diagnosis: Necrotizing soft tissue infection  Discharge Diagnosis Patient Active Problem List   Diagnosis Date Noted   Necrotizing soft tissue infection 05/25/2020   Hyperglycemia 05/25/2020   Class 1 obesity due to excess calories with body mass index (BMI) of 32.0 to 32.9 in adult 05/25/2020   NSVT (nonsustained ventricular tachycardia) (HCC)    Hyperlipidemia    ST elevation myocardial infarction (STEMI) of inferior wall (HCC) 11/19/2012   Tobacco abuse 11/19/2012   Coronary atherosclerosis of native coronary artery 11/19/2012    Consultants Hospitalist Infectious disease  Imaging: No results found.  Procedures Dr. Bedelia Person (05/25/2020) - incision and debridement of right groin and scrotum Dr. Bedelia Person (05/27/2020) - incision and debridement of right groin and scrotum, negative pressure wound vac application 34cmx15cmx9cm   Hospital Course:  Adam Haney is a 54yo male PMH CAD with inferior STEMI s/p cardiac stent x4 in 2014 on plavix, HLD, and obesity who presented to [facility] 11/1 with worsening chills, body aches, and right groin pain. In the ED he underwent CT scan which showed soft tissue infection in the right groin region with edema and soft tissue gas, no circumscribed abscess collection, edema and gas extend into the proximal right scrotal region. WBC 23.3, lactic acid 1.8. Concern for necrotizing soft tissue infection. Patient was admitted and taken to the operating room for procedure listed above. He required a second debridement two days later. The wound cleaned up nicely and wound vac was applied. Leukocytosis improved. Culture grew actinomyces therefore infectious disease was consulted for antibiotic management. He was discharged on augmentin and doxycycline and will follow up in their  office for possible prolonged treatment for 3-6 months. Hospitalist was consulted for assistance with management of his medical issues and well and newly diagnosed prediabetes with A1c 6.3.  Patient worked with therapies during this admission who recommended home health PT when medically stable for discharge. On 11/8 the patient was voiding well, tolerating diet, ambulating well, pain well controlled, vital signs stable, incisions c/d/i and felt stable for discharge home.  Patient will follow up as below and knows to call with questions or concerns.    I have personally reviewed the patients medication history on the [place] controlled substance database.    Physical Exam: Gen: Alert, NAD, pleasant Pulm: rate and effort normal Abd: Soft, NT/ND Skin: warm and dry GU: large right groin wound extending down into right hemiscrotum with healthy granulation tissue, no purulent drainage or cellulitis noted, somewhat friable at left lateral aspect     Allergies as of 06/01/2020   No Known Allergies     Medication List    TAKE these medications   acetaminophen 500 MG tablet Commonly known as: TYLENOL Take 2 tablets (1,000 mg total) by mouth every 6 (six) hours.   amoxicillin-clavulanate 875-125 MG tablet Commonly known as: AUGMENTIN Take 1 tablet by mouth every 12 (twelve) hours for 3 days.   clopidogrel 75 MG tablet Commonly known as: PLAVIX TAKE 1 TABLET DAILY. PLEASE KEEP UPCOMING APPT IN JANUARY BEFORE ANYMORE REFILLS. FINAL ATTEMPT What changed: See the new instructions.   doxycycline 100 MG tablet Commonly known as: VIBRA-TABS Take 1 tablet (100 mg total) by mouth 2 (two) times daily with a meal for 3 days.   ezetimibe 10 MG tablet Commonly known as: ZETIA Take 1 tablet (10 mg total)  by mouth daily.   ibuprofen 800 MG tablet Commonly known as: ADVIL Take 800 mg by mouth every 6 (six) hours as needed for headache.   magic mouthwash w/lidocaine Soln Take 10 mLs by mouth 4  (four) times daily as needed for mouth pain.   methocarbamol 500 MG tablet Commonly known as: ROBAXIN Take 1 tablet (500 mg total) by mouth every 6 (six) hours as needed for muscle spasms.   metoprolol tartrate 25 MG tablet Commonly known as: LOPRESSOR Take 1 tablet (25 mg total) by mouth 2 (two) times daily.   nitroGLYCERIN 0.4 MG SL tablet Commonly known as: NITROSTAT Place 1 tablet (0.4 mg total) under the tongue every 5 (five) minutes as needed for chest pain.   Oxycodone HCl 10 MG Tabs Take 0.5-1 tablets (5-10 mg total) by mouth every 6 (six) hours as needed.   polyethylene glycol 17 g packet Commonly known as: MIRALAX / GLYCOLAX Take 17 g by mouth daily. Start taking on: June 02, 2020   rosuvastatin 40 MG tablet Commonly known as: CRESTOR Take 1 tablet (40 mg total) by mouth daily.   saccharomyces boulardii 250 MG capsule Commonly known as: FLORASTOR Take 1 capsule (250 mg total) by mouth 2 (two) times daily.            Durable Medical Equipment  (From admission, onward)         Start     Ordered   05/28/20 1404  For home use only DME 3 n 1  Once        05/28/20 1403   05/28/20 1404  For home use only DME Walker rolling  Once       Question Answer Comment  Walker: With 5 Inch Wheels   Patient needs a walker to treat with the following condition Necrotizing fasciitis (HCC)   Patient needs a walker to treat with the following condition S/P debridement      05/28/20 1403   05/28/20 1008  For home use only DME oxygen  Once       Question Answer Comment  Length of Need Lifetime   Liters per Minute 2   Frequency Continuous (stationary and portable oxygen unit needed)   Oxygen delivery system Gas      05/28/20 1007   05/28/20 0906  For home use only DME Vac  Once        05/28/20 0912            Follow-up Information    [place] Follow up.   Why: for home health services Contact information: [address] [place] [address] [phone]        Diamantina Monks, MD. Call.   Specialty: Surgery Why: We are working on your appointment, call to confirm. Please arrive 30 minutes prior to your appointment to check in and fill out paperwork. Bring photo ID and insurance information. Bring vac supplies. Contact information: [address] [place] [address] [phone]               Signed: Franne Forts, [facility] 06/01/2020, 1:01 PM Please see Amion for pager number during day hours 7:00am-4:30pm

## 2020-06-01 NOTE — Progress Notes (Signed)
PA Brooke at bedside, instructed to administer additional 5mg  oxy for wound vac change as PRN med not due until 1045. Per PA, PRN doses can be resumed at 1045 as 5mg  was just an additional dose.

## 2020-06-01 NOTE — Evaluation (Addendum)
Occupational Therapy Evaluation and DC Patient Details Name: Adam Haney MRN: [id number] DOB: [birth year] Today's Date: 06/01/2020    History of Present Illness Pt is a 54 y/o male admitted secondary to R goin infection. Pt is s/p I and D of R groin and scrotum on 11/1 and 11/3 with wound vac placement. PMH includes tobacco use, CAD s/p stent, and lumbar surgery.   Clinical Impression   PTA patient independent and working. Admitted for above and limited by pain, decreased activity tolerance. Patient demonstrating ability to complete ADLs and transfers using RW with supervision. Educated on compensatory techniques and modifications for ADLs, safety and equipment.  Patient reports he will have support of fiance intermittently (she works), but no concerns for ADL engagement at [employer].  Educated on [facility] options once cleared for shower, but pt plans to complete basin bathing initially.  Based on performance today, no further OT needs have been identified at this time. OT will sign off.     Follow Up Recommendations  No OT follow up;Supervision - Intermittent    Equipment Recommendations  None recommended by OT    Recommendations for Other Services       Precautions / Restrictions Precautions Precautions: Fall Precaution Comments: Rt groin vac Restrictions Weight Bearing Restrictions: No      Mobility Bed Mobility Overal bed mobility: Modified Independent Bed Mobility: Supine to Sit     Supine to sit: Modified independent (Device/Increase time)     General bed mobility comments: returned to supine with independence     Transfers Overall transfer level: Needs assistance Equipment used: Rolling walker (2 wheeled) Transfers: Sit to/from Stand Sit to Stand: Supervision         General transfer comment: for line mgmt and safety     Balance Overall balance assessment: Needs assistance Sitting-balance support: Feet supported;No upper extremity supported Sitting balance-Leahy  Scale: Fair Sitting balance - Comments: bracing with BUE support secondary to pain, but was able to maintain sitting without UE support   Standing balance support: Bilateral upper extremity supported;No upper extremity supported;During functional activity Standing balance-Leahy Scale: Poor Standing balance comment: relies on UE support dynamically                           ADL either performed or assessed with clinical judgement   ADL Overall ADL's : Needs assistance/impaired     Grooming: Supervision/safety;Standing   Upper Body Bathing: Sitting;Set up   Lower Body Bathing: Sit to/from stand;Cueing for compensatory techniques;Supervison/ safety Lower Body Bathing Details (indicate cue type and reason): increased effort to reach R LE, diagonal sitting bringing LE onto bed to reach  Upper Body Dressing : Set up   Lower Body Dressing: Supervision/safety;Sit to/from stand;Cueing for compensatory techniques Lower Body Dressing Details (indicate cue type and reason): cueing for compensatory techniques, modified clothing discussed; supervision sit to stand  Toilet Transfer: Supervision/safety;Ambulation;RW Toilet Transfer Details (indicate cue type and reason): simulated in room Toileting- Clothing Manipulation and Hygiene: Supervision/safety;Sit to/from stand       Functional mobility during ADLs: Supervision/safety;Rolling walker;Cueing for safety General ADL Comments: pt limited by pain      Vision         Perception     Praxis      Pertinent Vitals/Pain Pain Assessment: 0-10 Pain Score: 8  Pain Location: R groin area Pain Descriptors / Indicators: Aching;Sore Pain Intervention(s): Limited activity within patient's tolerance;Monitored during session;Repositioned;Patient requesting pain meds-RN notified  Hand Dominance     Extremity/Trunk Assessment Upper Extremity Assessment Upper Extremity Assessment: Overall WFL for tasks assessed   Lower Extremity  Assessment Lower Extremity Assessment: Defer to PT evaluation   Cervical / Trunk Assessment Cervical / Trunk Assessment: Normal   Communication Communication Communication: No difficulties   Cognition Arousal/Alertness: Awake/alert Behavior During Therapy: WFL for tasks assessed/performed Overall Cognitive Status: Within Functional Limits for tasks assessed                                     General Comments  pt reports fiance can assist as needed    Exercises     Shoulder Instructions      Home Living Family/patient expects to be discharged to:: Private residence Living Arrangements: Spouse/significant other Available Help at Discharge: Family;Available PRN/intermittently Type of Home: House Home Access: Stairs to enter [employer] of Steps: 5 Entrance Stairs-Rails: Right;Left;Can reach both Home Layout: One level     Bathroom Shower/Tub: Chief Strategy Officer: Standard     Home Equipment: None          Prior Functioning/Environment Level of Independence: Independent        Comments: and working         OT Problem List: Decreased activity tolerance;Impaired balance (sitting and/or standing);Pain;Decreased knowledge of use of DME or AE;Decreased knowledge of precautions      OT Treatment/Interventions: Self-care/ADL training;Therapeutic exercise;DME and/or AE instruction;Therapeutic activities;Patient/family education;Balance training    OT Goals(Current goals can be found in the care plan section) Acute Rehab OT Goals Patient Stated Goal: to decrease pain  OT Goal Formulation: With patient  OT Frequency: Min 2X/week   Barriers to D/C:            Co-evaluation              AM-PAC OT "6 Clicks" Daily Activity     Outcome Measure Help from another person eating meals?: None Help from another person taking care of personal grooming?: A Little Help from another person toileting, which includes using  toliet, bedpan, or urinal?: A Little Help from another person bathing (including washing, rinsing, drying)?: A Little Help from another person to put on and taking off regular upper body clothing?: A Little Help from another person to put on and taking off regular lower body clothing?: A Little 6 Click Score: 19   End of Session Equipment Utilized During Treatment: Rolling walker Nurse Communication: Mobility status;Patient requests pain meds  Activity Tolerance: Patient tolerated treatment well Patient left: with call bell/phone within reach;in bed  OT Visit Diagnosis: Other abnormalities of gait and mobility (R26.89);Pain Pain - Right/Left: Right Pain - part of body:  (groin)                Time: 8841-6606 OT Time Calculation (min): 16 min Charges:  OT General Charges $OT Visit: 1 Visit OT Evaluation $OT Eval Moderate Complexity: 1 Mod  Barry Brunner, OT Acute Rehabilitation Services Pager [phone] Office [phone]   Chancy Milroy 06/01/2020, 1:31 PM

## 2020-06-01 NOTE — TOC Progression Note (Addendum)
Transition of Care [facility]) - Progression Note    Patient Details  Name: Adam Haney MRN: [id number] Date of Birth: 02-May-1966  Transition of Care [facility]) CM/SW Contact  Lorri Frederick, LCSW Phone Number: 06/01/2020, 2:48 PM  Clinical Narrative: 0900: CSW faxed [employer] form to that company for wound vac order, received call back that second page of form needed to be filled out as well, spoke with [facility] and WOC RN to obtain additional information and refaxed form.  1430: CSW spoke with Ethelene Browns at [facility] and this order is next in line to be worked on CSW will receive call from the assigned staff member.  They are aware pt is scheduled for DC today.     1600: TC Stacy Gardner, [facility].  THey have been unable to verify [name] policy is one they can accept and if it is not, they would be unable to provide [facility] services.  CSW spoke with supervisors Steward Drone and [place] regarding this and the fact that the wound vac is also not yet approved and delivered.  Recommended to delay DC.  CSW spoke with [facility] from [facility] and she agreed to cancel DC for today.  CSW spoke with RN and pt and informed them as well.      Expected Discharge Plan: Home w Home Health Services Barriers to Discharge: Continued Medical Work up, No Home Care Agency will accept this patient, Equipment Delay  Expected Discharge Plan and Services Expected Discharge Plan: Home w Home Health Services   Discharge Planning Services: CM Consult Post Acute Care Choice: Home Health, Durable Medical Equipment Living arrangements for the past 2 months: Single Family Home Expected Discharge Date: 06/01/20               DME Arranged: 3-N-1, Oxygen, Walker rolling DME Agency: [employer] Date DME Agency Contacted: 06/01/20 Time DME Agency Contacted: 1056 Representative spoke with at DME Agency: Velna Hatchet HH Arranged: RN, PT [facility] Agency: [facility] Date HH Agency Contacted: 05/31/20 Time HH Agency Contacted: 1143 Representative  spoke with at [facility] Agency: weekend coverage, Bonita Quin   Social Determinants of Health (SDOH) Interventions    Readmission Risk Interventions No flowsheet data found.

## 2020-06-01 NOTE — Progress Notes (Signed)
PROGRESS NOTE    Adam Haney  SJG:[id number] DOB: 08-25-1965 DOA: 05/25/2020 PCP: [name], MD   Chief Complain: Fever, chills, groin pain  Brief Narrative:  Patient is a 54 year old male with history of diabetes type 2, coronary artery  disease status post PCI, obesity who presented with fever, chills and groin pain from home.  On presentation, CT showed soft tissue infection in the groin with edema and gas, elevated leukocytosis and he was febrile presentation.  Was suspected to have Fournier's gangrene and was seen by general surgery and was taken to the OR for I&D of the right groin and scrotum. Underwent another I&D on 05/27/20.  TR[facility] is consulting for medical assistance. Patient being discharged to home today.  Assessment & Plan:   Principal Problem:   Necrotizing soft tissue infection Active Problems:   Tobacco abuse   Coronary atherosclerosis of native coronary artery   Hyperlipidemia   Hyperglycemia   Class 1 obesity due to excess calories with body mass index (BMI) of 32.0 to 32.9 in adult   Severe sepsis: Presented with fever leukocytosis, tachycardia. Blood cultures NGTD.  Improved leukocytosis ,afebrile. Sepsis physiology has resolved  Fournier's gangrene: Underwent I&D on 11/1 and 11/3.  General surgery managing.Wound culture showing  mixed organisms and also showed few actinomyces. Continue  pain control.  ID recommended Augmentin and doxycycline for 7 days and follow-up with ID clinic on 06/04/2020.  Elevated liver enzymes: Viral hepatitis panel negative. Crestor and zetia held with  improvement in the liver function.. Normal CK. Ultrasound of the liver showed fatty infiltration. Check liver enzymes in 1 to 2 weeks.  Prediabetes with hyperglycemia: Elevated blood sugars.  Not on antihyperglycemic's at home.  Hemoglobin A1c of 6.3.    Coronary artery disease: Currently stable.  Status post PCI artery.  On Crestor, Zetia and metoprolol at home.  Takes Plavix at  home which shd continue  Hyponatremia: Mild.    Nutrition Problem: Increased nutrient needs Etiology: post-op healing      DVT prophylaxis:SCD Code Status: Full Family Communication: Discussed with wife at the bedside on 05/31/20 Status is: Inpatient    Procedures: I&D  Antimicrobials:  Anti-infectives (From admission, onward)   Start     Dose/Rate Route Frequency Ordered Stop   06/01/20 0000  amoxicillin-clavulanate (AUGMENTIN) 875-125 MG tablet        1 tablet Oral Every 12 hours 06/01/20 1304 06/04/20 2359   06/01/20 0000  doxycycline (VIBRA-TABS) 100 MG tablet        100 mg Oral 2 times daily with meals 06/01/20 1304 06/04/20 2359   05/29/20 1200  doxycycline (VIBRA-TABS) tablet 100 mg        100 mg Oral 2 times daily with meals 05/29/20 1018     05/29/20 1115  amoxicillin-clavulanate (AUGMENTIN) 875-125 MG per tablet 1 tablet        1 tablet Oral Every 12 hours 05/29/20 1018     05/25/20 2200  vancomycin (VANCOCIN) IVPB 1000 mg/200 mL premix  Status:  Discontinued        1,000 mg 200 mL/hr over 60 Minutes Intravenous Every 12 hours 05/25/20 1626 05/29/20 1018   05/25/20 2000  clindamycin (CLEOCIN) IVPB 600 mg  Status:  Discontinued        600 mg 100 mL/hr over 30 Minutes Intravenous Every 6 hours 05/25/20 1838 05/28/20 0749   05/25/20 2000  piperacillin-tazobactam (ZOSYN) IVPB 3.375 g  Status:  Discontinued        3.375  g 12.5 mL/hr over 240 Minutes Intravenous Every 8 hours 05/25/20 1838 05/29/20 1018   05/25/20 1245  clindamycin (CLEOCIN) IVPB 900 mg        900 mg 100 mL/hr over 30 Minutes Intravenous  Once 05/25/20 1235 05/25/20 1437   05/25/20 1015  vancomycin (VANCOREADY) IVPB 1750 mg/350 mL        1,750 mg 175 mL/hr over 120 Minutes Intravenous  Once 05/25/20 0925 05/25/20 1841   05/25/20 0930  piperacillin-tazobactam (ZOSYN) IVPB 3.375 g        3.375 g 100 mL/hr over 30 Minutes Intravenous  Once 05/25/20 0925 05/25/20 1022      Subjective:  Patient seen  and examined at the bedside this morning.  Comfortable.  Pain well controlled today.  Objective: Vitals:   06/01/20 0403 06/01/20 0733 06/01/20 1110 06/01/20 1147  BP: 139/88 (!) 120/91  127/76  Pulse: 76 75 84 83  Resp: 14     Temp: 98 F (36.7 C) 98.8 F (37.1 C)    TempSrc: Oral Oral    SpO2: 100%     Weight:      Height:        Intake/Output Summary (Last 24 hours) at 06/01/2020 1414 Last data filed at 06/01/2020 0451 Gross per 24 hour  Intake --  Output 1305 ml  Net -1305 ml   Filed Weights   05/25/20 1255 05/27/20 0835  Weight: 114.3 kg 114.3 kg    Examination:   General exam: Appears calm and comfortable ,Not in distress,average built HEENT:PERRL,Oral mucosa moist, Ear/Nose normal on gross exam Respiratory system: Bilateral equal air entry, normal vesicular breath sounds, no wheezes or crackles  Cardiovascular system: S1 & S2 heard, RRR. No JVD, murmurs, rubs, gallops or clicks. Gastrointestinal system: Abdomen is nondistended, soft and nontender. No organomegaly or masses felt. Normal bowel sounds heard. Central nervous system: Alert and oriented. No focal neurological deficits. Extremities: No edema, no clubbing ,no cyanosis, distal peripheral pulses palpable. Skin: Wound on the right groin        Data Reviewed: I have personally reviewed following labs and imaging studies  CBC: Recent Labs  Lab 05/27/20 0149 05/28/20 0100 05/29/20 0306 05/30/20 0602 05/31/20 0602  WBC 18.5* 16.5* 17.2* 14.9* 10.8*  NEUTROABS  --  12.9* 13.4* 12.6* 8.8*  HGB 11.3* 11.7* 12.1* 12.8* 12.7*  HCT 34.9* 35.5* 36.7* 38.9* 38.3*  MCV 91.1 91.0 91.3 88.0 88.7  PLT 237 246 247 233 225   Basic Metabolic Panel: Recent Labs  Lab 05/28/20 0100 05/29/20 0306 05/30/20 0602 05/31/20 0602 06/01/20 0636  NA 135 132* 129* 130* 131*  K 3.9 3.6 4.2 3.9 4.1  CL 99 96* 98 97* 99  CO2 29 29 23 25 23   GLUCOSE 117* 138* 117* 132* 122*  BUN 13 11 10 13 15   CREATININE 0.81  0.86 0.73 0.76 0.73  CALCIUM 7.8* 8.0* 8.1* 8.2* 8.0*   GFR: Estimated Creatinine Clearance: 143.5 mL/min (by C-G formula based on SCr of 0.73 mg/dL). Liver Function Tests: Recent Labs  Lab 05/27/20 0149 05/28/20 0100 05/29/20 0306 05/30/20 0602 06/01/20 0636  AST 180* 256* 156* 51* 93*  ALT 215* 339* 298* 194* 180*  ALKPHOS 91 109 120 125 110  BILITOT 0.8 1.2 1.4* 1.5* 1.2  PROT 5.5* 5.9* 6.6 7.5 7.7  ALBUMIN 1.7* 1.9* 1.9* 2.0* 2.0*   No results for input(s): LIPASE, AMYLASE in the last 168 hours. No results for input(s): AMMONIA in the last 168 hours. Coagulation Profile:  No results for input(s): INR, PROTIME in the last 168 hours. Cardiac Enzymes: Recent Labs  Lab 05/27/20 1148  CKTOTAL 50   BNP (last 3 results) No results for input(s): PROBNP in the last 8760 hours. HbA1C: No results for input(s): HGBA1C in the last 72 hours. CBG: Recent Labs  Lab 05/31/20 1155 05/31/20 1708 05/31/20 1934 06/01/20 0735 06/01/20 1245  GLUCAP 149* 87 155* 131* 171*   Lipid Profile: No results for input(s): CHOL, HDL, LDLCALC, TRIG, CHOLHDL, LDLDIRECT in the last 72 hours. Thyroid Function Tests: No results for input(s): TSH, T4TOTAL, FREET4, T3FREE, THYROIDAB in the last 72 hours. Anemia Panel: No results for input(s): VITAMINB12, FOLATE, FERRITIN, TIBC, IRON, RETICCTPCT in the last 72 hours. Sepsis Labs: No results for input(s): PROCALCITON, LATICACIDVEN in the last 168 hours.  Recent Results (from the past 240 hour(s))  Blood culture (routine x 2)     Status: None   Collection Time: 05/25/20  7:38 AM   Specimen: BLOOD  Result Value Ref Range Status   Specimen Description BLOOD LEFT ANTECUBITAL  Final   Special Requests   Final    BOTTLES DRAWN AEROBIC AND ANAEROBIC Blood Culture adequate volume   Culture   Final    NO GROWTH 5 DAYS Performed at [facility], [address] [place], [place] [address]    Report Status 05/30/2020 FINAL  Final  Blood culture  (routine x 2)     Status: None   Collection Time: 05/25/20  9:33 AM   Specimen: BLOOD  Result Value Ref Range Status   Specimen Description BLOOD RIGHT ANTECUBITAL  Final   Special Requests   Final    BOTTLES DRAWN AEROBIC AND ANAEROBIC Blood Culture adequate volume   Culture   Final    NO GROWTH 5 DAYS Performed at [facility], [address] [place], [place] [address]    Report Status 05/30/2020 FINAL  Final  Respiratory Panel by RT PCR (Flu A&B, Covid) - Nasopharyngeal Swab     Status: None   Collection Time: 05/25/20 10:06 AM   Specimen: Nasopharyngeal Swab  Result Value Ref Range Status   SARS Coronavirus 2 by RT PCR NEGATIVE NEGATIVE Final    Comment: (NOTE) SARS-CoV-2 target nucleic acids are NOT DETECTED.  The SARS-CoV-2 RNA is generally detectable in upper respiratoy specimens during the acute phase of infection. The lowest concentration of SARS-CoV-2 viral copies this assay can detect is 131 copies/mL. A negative result does not preclude SARS-Cov-2 infection and should not be used as the sole basis for treatment or other patient management decisions. A negative result may occur with  improper specimen collection/handling, submission of specimen other than nasopharyngeal swab, presence of viral mutation(s) within the areas targeted by this assay, and inadequate number of viral copies (<131 copies/mL). A negative result must be combined with clinical observations, patient history, and epidemiological information. The expected result is Negative.  Fact Sheet for Patients:  https://www.moore.com/https://www.fda.gov/media/142436/download  Fact Sheet for Healthcare Providers:  https://www.young.biz/https://www.fda.gov/media/142435/download  This test is no t yet approved or cleared by the [place] FDA and  has been authorized for detection and/or diagnosis of SARS-CoV-2 by FDA under an Emergency Use Authorization (EUA). This EUA will remain  in effect (meaning this test can be used) for the duration  of the COVID-19 declaration under Section 564(b)(1) of the Act, 21 U.S.C. section 360bbb-3(b)(1), unless the authorization is terminated or revoked sooner.     Influenza A by PCR NEGATIVE NEGATIVE Final  Influenza B by PCR NEGATIVE NEGATIVE Final    Comment: (NOTE) The Xpert Xpress SARS-CoV-2/FLU/RSV assay is intended as an aid in  the diagnosis of influenza from Nasopharyngeal swab specimens and  should not be used as a sole basis for treatment. Nasal washings and  aspirates are unacceptable for Xpert Xpress SARS-CoV-2/FLU/RSV  testing.  Fact Sheet for Patients: https://www.moore.com/  Fact Sheet for Healthcare Providers: https://www.young.biz/  This test is not yet approved or cleared by the [place] FDA and  has been authorized for detection and/or diagnosis of SARS-CoV-2 by  FDA under an Emergency Use Authorization (EUA). This EUA will remain  in effect (meaning this test can be used) for the duration of the  Covid-19 declaration under Section 564(b)(1) of the Act, 21  U.S.C. section 360bbb-3(b)(1), unless the authorization is  terminated or revoked. Performed at [facility], [address], [place], [place] [address]   Aerobic/Anaerobic Culture (surgical/deep wound)     Status: None   Collection Time: 05/25/20  2:17 PM   Specimen: PATH Other; Tissue  Result Value Ref Range Status   Specimen Description ABSCESS SCROTUM  Final   Special Requests PT ON CLINDAMYCIN  Final   Gram Stain   Final    RARE WBC PRESENT,BOTH PMN AND MONONUCLEAR ABUNDANT GRAM POSITIVE COCCI MODERATE GRAM NEGATIVE RODS FEW GRAM POSITIVE RODS Performed at [facility], [address], [place], [place] [address]    Culture   Final    FEW ACTINOMYCES SPECIES MIXED ANAEROBIC FLORA PRESENT.  CALL LAB IF FURTHER IID REQUIRED.    Report Status 05/31/2020 FINAL  Final         Radiology Studies: No results found.      Scheduled  Meds: . (feeding supplement) PROSource Plus  30 mL Oral BID BM  . acetaminophen  650 mg Oral Q6H   Or  . acetaminophen  650 mg Rectal Q6H  . amoxicillin-clavulanate  1 tablet Oral Q12H  . Chlorhexidine Gluconate Cloth  6 each Topical Daily  . docusate sodium  100 mg Oral BID  . doxycycline  100 mg Oral BID WC  . insulin aspart  0-15 Units Subcutaneous TID WC  . insulin aspart  0-5 Units Subcutaneous QHS  . methocarbamol  500 mg Oral Q6H  . metoprolol tartrate  25 mg Oral BID  . multivitamin with minerals  1 tablet Oral Daily  . nutrition supplement (JUVEN)  1 packet Oral BID BM  . polyethylene glycol  17 g Oral Daily  . Ensure Max Protein  11 oz Oral BID  . saccharomyces boulardii  250 mg Oral BID   Continuous Infusions:    LOS: 7 days    Time spent:15 mins. More than 50% of that time was spent in counseling and/or coordination of care.      Burnadette Pop, MD [facility] P11/02/2020, 2:14 PM

## 2020-06-01 NOTE — Plan of Care (Signed)
  Problem: Education: Goal: Knowledge of General Education information will improve Description: Including pain rating scale, medication(s)/side effects and non-pharmacologic comfort measures Outcome: Not Progressing   Problem: Health Behavior/Discharge Planning: Goal: Ability to manage health-related needs will improve Outcome: Not Progressing   Problem: Clinical Measurements: Goal: Ability to maintain clinical measurements within normal limits will improve Outcome: Not Progressing Goal: Will remain free from infection Outcome: Not Progressing Goal: Diagnostic test results will improve Outcome: Not Progressing Goal: Respiratory complications will improve Outcome: Not Progressing Goal: Cardiovascular complication will be avoided Outcome: Not Progressing   Problem: Coping: Goal: Level of anxiety will decrease Outcome: Not Progressing   Problem: Elimination: Goal: Will not experience complications related to bowel motility Outcome: Not Progressing Goal: Will not experience complications related to urinary retention Outcome: Not Progressing   Problem: Pain Managment: Goal: General experience of comfort will improve Outcome: Not Progressing   

## 2020-06-01 NOTE — Progress Notes (Signed)
Physical Therapy Treatment Patient Details Name: Adam Haney MRN: [id number] DOB: April 27, 1966 Today's Date: 06/01/2020    History of Present Illness Pt is a 54 y/o male admitted secondary to R goin infection. Pt is s/p I and D of R groin and scrotum on 11/1 and 11/3 with wound vac placement. PMH includes tobacco use, CAD s/p stent, and lumbar surgery.    PT Comments    Pt presented to PT right after a wound vac change, so had high pain but was willing to perform functional mobility. Pt was able to perform gait and stairs while maintaining SpO2 above 90, so pt doesn't need O2 for d/c. Pt was able to perform 5 stairs safely, which is needed for him to be able to go home.    Follow Up Recommendations  No PT follow up     Equipment Recommendations  Rolling walker with 5" wheels;3in1 (PT)    Recommendations for Other Services       Precautions / Restrictions Precautions Precautions: Fall Precaution Comments: Rt groin vac    Mobility  Bed Mobility Overal bed mobility: Modified Independent Bed Mobility: Supine to Sit     Supine to sit: Modified independent (Device/Increase time)     General bed mobility comments: modified independent for increased time with supine to sit and lines and lead management  Transfers Overall transfer level: Modified independent Equipment used: Rolling walker (2 wheeled) Transfers: Sit to/from Stand Sit to Stand: Modified independent (Device/Increase time)         General transfer comment: increased time to perform sit to stand and lines and lead management. Pt pulled up on RW, so PT braced RW; OT took over for stand to sit  Ambulation/Gait Ambulation/Gait assistance: Min guard Gait Distance (Feet): 300 Feet Assistive device: Rolling walker (2 wheeled) Gait Pattern/deviations: Decreased stance time - right;Decreased stride length   Gait velocity interpretation: 1.31 - 2.62 ft/sec, indicative of limited community ambulator      Stairs Stairs: Yes Stairs assistance: Min guard Stair Management: One rail Left Number of Stairs: 5 General stair comments: PT had management of telemetry and wound vac; pt was cued to go up with the LT leg and down with the RT leg, but pt went down 4 of 5 steps with the LT leg leading   Wheelchair Mobility    Modified Rankin (Stroke Patients Only)       Balance Overall balance assessment: Needs assistance Sitting-balance support: Feet supported;Bilateral upper extremity supported Sitting balance-Leahy Scale: Fair Sitting balance - Comments: bracing with BUE support secondary to pain, but was able to maintain sitting without UE support   Standing balance support: Bilateral upper extremity supported;During functional activity Standing balance-Leahy Scale: Poor Standing balance comment: Reliant on BUE support on RW                            Cognition Arousal/Alertness: Awake/alert Behavior During Therapy: WFL for tasks assessed/performed Overall Cognitive Status: Within Functional Limits for tasks assessed                                        Exercises      General Comments        Pertinent Vitals/Pain Pain Assessment: 0-10 Pain Score: 8  Pain Location: R groin area Pain Descriptors / Indicators: Aching;Sore Pain Intervention(s): Limited activity within patient's tolerance;Monitored during  session;Premedicated before session;Repositioned    Home Living                      Prior Function            PT Goals (current goals can now be found in the care plan section) Progress towards PT goals: Progressing toward goals    Frequency    Min 3X/week      PT Plan Discharge plan needs to be updated    Co-evaluation              AM-PAC PT "6 Clicks" Mobility   Outcome Measure  Help needed turning from your back to your side while in a flat bed without using bedrails?: None Help needed moving from lying on  your back to sitting on the side of a flat bed without using bedrails?: None Help needed moving to and from a bed to a chair (including a wheelchair)?: A Little Help needed standing up from a chair using your arms (e.g., wheelchair or bedside chair)?: A Little Help needed to walk in hospital room?: A Little Help needed climbing 3-5 steps with a railing? : A Little 6 Click Score: 20    End of Session Equipment Utilized During Treatment: Gait belt Activity Tolerance: Patient tolerated treatment well Patient left: Other (comment) (OT took over once pt walked back into room) Nurse Communication: Other (comment) (that pt didn't need O2 during mobility) PT Visit Diagnosis: Muscle weakness (generalized) (M62.81);Difficulty in walking, not elsewhere classified (R26.2);Pain Pain - Right/Left: Right     Time: 2951-8841 PT Time Calculation (min) (ACUTE ONLY): 21 min  Charges:  $Gait Training: 8-22 mins                     [place], SPT [id number]   Demir Titsworth 06/01/2020, 11:39 AM

## 2020-06-01 NOTE — Progress Notes (Signed)
TCT Adam Haney [employer] wound vac rep for status on wound vac. Vac has been approved and is for delivery this afternoon. She does not know the estimated time of the delivery. [facility]  Manager called concerning needing HHRN. Per Denny Peon, they are unable to verify the patient's insurance.   Patient is unable to be discharged home today until [facility] is arranged. Awaiting callback for [facility] Manager.  Abelino Derrick [facility] Advance [facility] Supervisor [phone]

## 2020-06-01 NOTE — Discharge Instructions (Addendum)
Negative Pressure Wound Therapy Home Guide Negative pressure wound therapy (NPWT) uses a sponge or foam-like material (dressing) placed on or inside the wound. The wound is then covered and sealed with a cover dressing that sticks to your skin (is adhesive). This keeps air out. A tube is attached to the cover dressing, and this tube connects to a small pump. The pump sucks fluid and germs from the wound. NPWT helps to increase blood flow to the wound and heal it from the inside. What are the risks? NPWT is usually safe to use. However, problems can occur, including:  Skin irritation from the dressing adhesive.  Bleeding.  Infection.  Dehydration. Wounds with large amounts of drainage can cause excessive fluid loss.  Pain. Supplies needed:  A disposable garbage bag.  Soap and water, or hand sanitizer.  Wound cleanser or salt-water solution (saline).  New sponge and cover dressing.  Protective clothing.  Gauze pad.  Vinyl gloves.  Tape.  Skin protectant. This may be a wipe, film, or spray.  Clean or germ-free (sterile) scissors.  Eye protection. How to change your dressing Prepare to change your dressing  1. If told by your health care provider, take pain medicine 30 minutes before changing the dressing. 2. Wash your hands with soap and water. Dry your hands with a clean towel. If soap and water are not available, use hand sanitizer. 3. Set up a clean station for wound care. 4. Open the dressing package so that the sponge dressing remains on the inside of the package. 5. Wear gloves, protective clothing, and eye protection. Remove old dressing  1. Turn off the pump and disconnect the tubing from the dressing. 2. Carefully remove the adhesive cover dressing in the direction of your hair growth. 3. Remove the sponge dressing that is inside the wound. If the sponge sticks, use a wound cleanser or saline solution to wet the sponge and help it come off more easily. 4. Throw  the old sponge and cover dressing supplies into the garbage bag. 5. Remove your gloves by grabbing the cuff and turning the glove inside out. Place the gloves in the trash immediately. 6. Wash your hands with soap and water. Dry your hands with a clean towel. If soap and water are not available, use hand sanitizer. Clean your wound  Wear gloves, protective clothing, and eye protection. Follow your health care provider's instructions on how to clean your wound. You may be told to: 1. Clean the wound using a saline solution or a wound cleanser and a clean gauze pad. 2. Pat the wound dry with a gauze pad. Do not rub the wound. 3. Throw the gauze pad into the garbage bag. 4. Remove your gloves by grabbing the cuff and turning the glove inside out. Place the gloves in the trash immediately. 5. Wash your hands with soap and water. Dry your hands with a clean towel. If soap and water are not available, use hand sanitizer. Apply new dressing  Wear gloves, protective clothing, and eye protection. 1. If told by your health care provider, apply a skin protectant to any skin that will be exposed to adhesive. Let the skin protectant dry. 2. Cut a piece of new sponge dressing and put it on or in the wound. 3. Using clean scissors, cut a nickel-sized hole in the new cover dressing. 4. Apply the cover dressing. 5. Attach the suction tube over the hole in the cover dressing. 6. Take off your gloves. Put them in the  plastic bag with the old dressing. Tie the bag shut and throw it away. 7. Wash your hands with soap and water. Dry your hands with a clean towel. If soap and water are not available, use hand sanitizer. 8. Turn the pump back on. The sponge dressing should collapse. Do not change the settings on the machine without talking to a health care provider. 9. Replace the container in the pump that collects fluid if it is full. Replace the container per the manufacturer's instructions or at least once a  week, even if it is not full. General tips and recommendations If the alarm sounds:  Stay calm.  Do not turn off the pump or do anything with the dressing.  Reasons the alarm may go off: ? The battery is low. Change the battery or plug the device into electrical power. ? The dressing has a leak. Find the leak and put tape over the leak. ? The fluid collection container is full. Change the fluid container.  Call your health care provider right away if you cannot fix the problem.  Explain to your health care provider what is happening. Follow his or her instructions. General instructions  Do not turn off the pump unless told to do so by your health care provider.  Do not turn off the pump for more than 2 hours. If the pump is off for more than 2 hours, the dressing will need to be changed.  If your health care provider says it is okay to shower: ? Do not take the pump into the shower. ? Make sure the wound dressing is protected and sealed. The wound dressing must stay dry.  Check frequently that the machine indicates that therapy is on and that all clamps are open.  Do not use over-the-counter medicated or antiseptic creams, sprays, liquids, or dressings unless your health care provider approves. Contact a health care provider if:  You have new pain.  You develop irritation, a rash, or itching around the wound or dressing.  You see new black or yellow tissue in your wound.  The dressing changes are painful or cause bleeding.  The pump has been off for more than 2 hours, and you do not know how to change the dressing.  The pump alarm goes off, and you do not know what to do. Get help right away if:  You have a lot of bleeding.  The wound breaks open.  You have severe pain.  You have signs of infection, such as: ? More redness, swelling, or pain. ? More fluid or blood. ? Warmth. ? Pus or a bad smell. ? Red streaks leading from the wound. ? A fever.  You see a  sudden change in the color or texture of the drainage.  You have signs of dehydration, such as: ? Little or no tears, urine, or sweat. ? Muscle cramps. ? Very dry mouth. ? Headache. ? Dizziness. Summary  Negative pressure wound therapy (NPWT) is a device that helps your wound heal.  Set up a clean station for wound care. Your health care provider will tell you what supplies to use.  Follow your health care provider's instructions on how to clean your wound and how to change the dressing.  Contact a health care provider if you have new pain, an irritation, or a rash, or if the alarm goes off and you do not know what to do.  Get help right away if you have a lot of bleeding, your wound breaks  open, or you have severe pain. Also, get help if you have signs of infection. This information is not intended to replace advice given to you by your health care provider. Make sure you discuss any questions you have with your health care provider. Document Revised: 11/02/2018 Document Reviewed: 09/28/2018 Elsevier Patient Education  2020 [employer] Heart Healthy, Consistent Carbohydrate Nutrition Therapy   A heart-healthy and consistent carbohydrate diet is recommended to manage heart disease and diabetes. To follow a heart-healthy and consistent carbohydrate diet, . Eat a balanced diet with whole grains, fruits and vegetables, and lean protein sources.  . Choose heart-healthy unsaturated fats. Limit saturated fats, trans fats, and cholesterol intake. Eat more plant-based or vegetarian meals using beans and soy foods for protein.  . Eat whole, unprocessed foods to limit the amount of sodium (salt) you eat.  . Choose a consistent amount of carbohydrate at each meal and snack. Limit refined carbohydrates especially sugar, sweets and sugar-sweetened beverages.  . If you drink alcohol, do so in moderation: one serving per day (women) and two servings per day (men). o One serving is equivalent to 12  ounces beer, 5 ounces wine, or 1.5 ounces distilled spirits  Tips Tips for Choosing Heart-Healthy Fats Choose lean protein and low-fat dairy foods to reduce saturated fat intake. . Saturated fat is usually found in animal-based protein and is associated with certain health risks. Saturated fat is the biggest contributor to raise low-density lipoprotein (LDL) cholesterol levels. Research shows that limiting saturated fat lowers unhealthy cholesterol levels. Eat no more than 7% of your total calories each day from saturated fat. Ask your RDN to help you determine how much saturated fat is right for you. . There are many foods that do not contain large amounts of saturated fats. Swapping these foods to replace foods high in saturated fats will help you limit the saturated fat you eat and improve your cholesterol levels. You can also try eating more plant-based or vegetarian meals. Instead of. Try:  Whole milk, cheese, yogurt, and ice cream 1% or skim milk, low-fat cheese, non-fat yogurt, and low-fat ice cream  Fatty, marbled beef and pork Lean beef, pork, or venison  Poultry with skin Poultry without skin  Butter, stick margarine Reduced-fat, whipped, or liquid spreads  Coconut oil, palm oil Liquid vegetable oils: corn, canola, olive, soybean and safflower oils   Avoid foods that contain trans fats. . Trans fats increase levels of LDL-cholesterol. Hydrogenated fat in processed foods is the main source of trans fats in foods.  . Trans fats can be found in stick margarine, shortening, processed sweets, baked goods, some fried foods, and packaged foods made with hydrogenated oils. Avoid foods with "partially hydrogenated oil" on the ingredient list such as: cookies, pastries, baked goods, biscuits, crackers, microwave popcorn, and frozen dinners. Choose foods with heart healthy fats. . Polyunsaturated and monounsaturated fat are unsaturated fats that may help lower your blood cholesterol level when used  in place of saturated fat in your diet. . Ask your RDN about taking a dietary supplement with plant sterols and stanols to help lower your cholesterol level. [name] Research shows that substituting saturated fats with unsaturated fats is beneficial to cholesterol levels. Try these easy swaps: Instead of. Try:  Butter, stick margarine, or solid shortening Reduced-fat, whipped, or liquid spreads  Beef, pork, or poultry with skin Fish and seafood  Chips, crackers, snack foods Raw or unsalted nuts and seeds or nut butters Hummus with vegetables Avocado on toast  Coconut oil, palm oil Liquid vegetable oils: corn, canola, olive, soybean and safflower oils  Limit the amount of cholesterol you eat to less than 200 milligrams per day. . Cholesterol is a substance carried through the bloodstream via lipoproteins, which are known as "transporters" of fat. Some body functions need cholesterol to work properly, but too much cholesterol in the bloodstream can damage arteries and build up blood vessel linings (which can lead to heart attack and stroke). You should eat less than 200 milligrams cholesterol per day. [name] People respond differently to eating cholesterol. There is no test available right now that can figure out which people will respond more to dietary cholesterol and which will respond less. For individuals with high intake of dietary cholesterol, different types of increase (none, small, moderate, large) in LDL-cholesterol levels are all possible.  . Food sources of cholesterol include egg yolks and organ meats such as liver, gizzards. Limit egg yolks to two to four per week and avoid organ meats like liver and gizzards to control cholesterol intake. Tips for Choosing Heart-Healthy Carbohydrates Consume a consistent amount of carbohydrate . It is important to eat foods with carbohydrates in moderation because they impact your blood glucose level. Carbohydrates can be found in many foods such as: . Grains  (breads, crackers, rice, pasta, and cereals)  . Starchy Vegetables (potatoes, corn, and peas)  . Beans and legumes  . Milk, soy milk, and yogurt  . Fruit and fruit juice  . Sweets (cakes, cookies, ice cream, jam and jelly) . Your RDN will help you set a goal for how many carbohydrate servings to eat at your meals and snacks. For many adults, eating 3 to 5 servings of carbohydrate foods at each meal and 1 or 2 carbohydrate servings for each snack works well.  . Check your blood glucose level regularly. It can tell you if you need to adjust when you eat carbohydrates. . Choose foods rich in viscous (soluble) fiber . Viscous, or soluble, is found in the walls of plant cells. Viscous fiber is found only in plant-based foods. Eating foods with fiber helps to lower your unhealthy cholesterol and keep your blood glucose in range  . Rich sources of viscous fiber include vegetables (asparagus, Brussels sprouts, sweet potatoes, turnips) fruit (apricots, mangoes, oranges), legumes, and whole grains (barley, oats, and oat bran).  . As you increase your fiber intake gradually, also increase the amount of water you drink. This will help prevent constipation.  . If you have difficulty achieving this goal, ask your RDN about fiber laxatives. Choose fiber supplements made with viscous fibers such as psyllium seed husks or methylcellulose to help lower unhealthy cholesterol.  . Limit refined carbohydrates  . There are three types of carbohydrates: starches, sugar, and fiber. Some carbohydrates occur naturally in food, like the starches in rice or corn or the sugars in fruits and milk. Refined carbohydrates--foods with high amounts of simple sugars--can raise triglyceride levels. High triglyceride levels are associated with coronary heart disease. . Some examples of refined carbohydrate foods are table sugar, sweets, and beverages sweetened with added sugar. Tips for Reducing Sodium (Salt) Although sodium is important  for your body to function, too much sodium can be harmful for people with high blood pressure. As sodium and fluid buildup in your tissues and bloodstream, your blood pressure increases. High blood pressure may cause damage to other organs and increase your risk for a stroke. Even if you take a pill for blood pressure or a  water pill (diuretic) to remove fluid, it is still important to have less salt in your diet. Ask your doctor and RDN what amount of sodium is right for you. [name] Avoid processed foods. Eat more fresh foods.  . Fresh fruits and vegetables are naturally low in sodium, as well as frozen vegetables and fruits that have no added juices or sauces.  . Fresh meats are lower in sodium than processed meats, such as bacon, sausage, and hotdogs. Read the nutrition label or ask your butcher to help you find a fresh meat that is low in sodium. . Eat less salt--at the table and when cooking.  . A single teaspoon of table salt has 2,300 mg of sodium.  . Leave the salt out of recipes for pasta, casseroles, and soups.  . Ask your RDN how to cook your favorite recipes without sodium . Be a [employer], building services.  . Look for food packages that say "salt-free" or "sodium-free." These items contain less than 5 milligrams of sodium per serving.  [name] "Very low-sodium" products contain less than 35 milligrams of sodium per serving.  [name] "Low-sodium" products contain less than 140 milligrams of sodium per serving.  . Beware for "Unsalted" or "No Added Salt" products. These items may still be high in sodium. Check the nutrition label. . Add flavors to your food without adding sodium.  . Try lemon juice, lime juice, fruit juice or vinegar.  . Dry or fresh herbs add flavor. Try basil, bay leaf, dill, rosemary, parsley, sage, dry mustard, nutmeg, thyme, and paprika.  . Pepper, red pepper flakes, and cayenne pepper can add spice t your meals without adding sodium. Hot sauce contains sodium, but if you use just a drop or two,  it will not add up to much.  Arnoldo Morale a sodium-free seasoning blend or make your own at home. Additional Lifestyle Tips Achieve and maintain a healthy weight. . Talk with your RDN or your doctor about what is a healthy weight for you. . Set goals to reach and maintain that weight.  . To lose weight, reduce your calorie intake along with increasing your physical activity. A weight loss of 10 to 15 pounds could reduce LDL-cholesterol by 5 milligrams per deciliter. Participate in physical activity. . Talk with your health care team to find out what types of physical activity are best for you. Set a plan to get about 30 minutes of exercise on most days.  Foods Recommended Food Group Foods Recommended  Grains Whole grain breads and cereals, including whole wheat, barley, rye, buckwheat, corn, teff, quinoa, millet, amaranth, brown or wild rice, sorghum, and oats Pasta, especially whole wheat or other whole grain types  [employer] [place] [employer], quinoa or wild rice Whole grain crackers, bread, rolls, pitas Home-made bread with reduced-sodium baking soda  Protein Foods Lean cuts of beef and pork (loin, leg, round, extra lean hamburger)  Skinless Press photographer and other wild game Dried beans and peas Nuts and nut butters Meat alternatives made with soy or textured vegetable protein  Egg whites or egg substitute Cold cuts made with lean meat or soy protein  Dairy Nonfat (skim), low-fat, or 1%-fat milk  Nonfat or low-fat yogurt or cottage cheese Fat-free and low-fat cheese  Vegetables Fresh, frozen, or canned vegetables without added fat or salt   Fruits Fresh, frozen, canned, or dried fruit   Oils Unsaturated oils (corn, olive, peanut, soy, sunflower, canola)  Soft or liquid margarines and vegetable oil spreads  Salad dressings Seeds  and nuts  Avocado   Foods Not Recommended Food Group Foods Not Recommended  Grains Breads or crackers topped with salt Cereals (hot or cold) with more than 300 mg  sodium per serving Biscuits, cornbread, and other "quick" breads prepared with baking soda Bread crumbs or stuffing mix from a store High-fat bakery products, such as doughnuts, biscuits, croissants, danish pastries, pies, cookies Instant cooking foods to which you add hot water and stir--potatoes, noodles, rice, etc. Packaged starchy foods--seasoned noodle or rice dishes, stuffing mix, macaroni and cheese dinner Snacks made with partially hydrogenated oils, including chips, cheese puffs, snack mixes, regular crackers, butter-flavored popcorn  Protein Foods Higher-fat cuts of meats (ribs, t-bone steak, regular hamburger) Bacon, sausage, or hot dogs Cold cuts, such as salami or bologna, deli meats, cured meats, corned beef Organ meats (liver, brains, gizzards, sweetbreads) Poultry with skin Fried or smoked meat, poultry, and fish Whole eggs and egg yolks (more than 2-4 per week) Salted legumes, nuts, seeds, or nut/seed butters Meat alternatives with high levels of sodium (>300 mg per serving) or saturated fat (>5 g per serving)  Dairy Whole milk,?2% fat milk, buttermilk Whole milk yogurt or ice cream Cream Half-&-half Cream cheese Sour cream Cheese  Vegetables Canned or frozen vegetables with salt, fresh vegetables prepared with salt, butter, cheese, or cream sauce Fried vegetables Pickled vegetables such as olives, pickles, or sauerkraut  Fruits Fried fruits Fruits served with butter or cream  Oils Butter, stick margarine, shortening Partially hydrogenated oils or trans fats Tropical oils (coconut, palm, palm kernel oils)  Other Candy, sugar sweetened soft drinks and desserts Salt, sea salt, garlic salt, and seasoning mixes containing salt Bouillon cubes Ketchup, barbecue sauce, Worcestershire sauce, soy sauce, teriyaki sauce Miso Salsa Pickles, olives, relish   Heart Healthy Consistent Carbohydrate Vegetarian (Lacto-Ovo) Sample 1-Day Menu  Breakfast 1 cup oatmeal, cooked (2  carbohydrate servings)   cup blueberries (1 carbohydrate serving)  11 almonds, without salt  1 cup 1% milk (1 carbohydrate serving)  1 cup coffee  Morning Snack 1 cup fat-free plain yogurt (1 carbohydrate serving)  Lunch 1 whole wheat bun (1 carbohydrate servings)  1 black bean burger (1 carbohydrate servings)  1 slice cheddar cheese, low sodium  2 slices tomatoes  2 leaves lettuce  1 teaspoon mustard  1 small pear (1 carbohydrate servings)  1 cup green tea, unsweetened  Afternoon Snack 1/3 cup trail mix with nuts, seeds, and raisins, without salt (1 carbohydrate servinga)  Evening Meal  cup meatless chicken  2/3 cup brown rice, cooked (2 carbohydrate servings)  1 cup broccoli, cooked (2/3 carbohydrate serving)   cup carrots, cooked (1/3 carbohydrate serving)  2 teaspoons olive oil  1 teaspoon balsamic vinegar  1 whole wheat dinner roll (1 carbohydrate serving)  1 teaspoon margarine, soft, tub  1 cup 1% milk (1 carbohydrate serving)  Evening Snack 1 extra small banana (1 carbohydrate serving)  1 tablespoon peanut butter   Heart Healthy Consistent Carbohydrate Vegan Sample 1-Day Menu  Breakfast 1 cup oatmeal, cooked (2 carbohydrate servings)   cup blueberries (1 carbohydrate serving)  11 almonds, without salt  1 cup soymilk fortified with calcium, vitamin B12, and vitamin D  1 cup coffee  Morning Snack 6 ounces soy yogurt (1 carbohydrate servings)  Lunch 1 whole wheat bun(1 carbohydrate servings)  1 black bean burger (1 carbohydrate serving)  2 slices tomatoes  2 leaves lettuce  1 teaspoon mustard  1 small pear (1 carbohydrate servings)  1 cup green tea,  unsweetened  Afternoon Snack 1/3 cup trail mix with nuts, seeds, and raisins, without salt (1 carbohydrate servings)  Evening Meal  cup meatless chicken  2/3 cup brown rice, cooked (2 carbohydrate servings)  1 cup broccoli, cooked (2/3 carbohydrate serving)   cup carrots, cooked (1/3 carbohydrate serving)  2  teaspoons olive oil  1 teaspoon balsamic vinegar  1 whole wheat dinner roll (1 carbohydrate serving)  1 teaspoon margarine, soft, tub  1 cup soymilk fortified with calcium, vitamin B12, and vitamin D  Evening Snack 1 extra small banana (1 carbohydrate serving)  1 tablespoon peanut butter    Heart Healthy Consistent Carbohydrate Sample 1-Day Menu  Breakfast 1 cup cooked oatmeal (2 carbohydrate servings)  3/4 cup blueberries (1 carbohydrate serving)  1 ounce almonds  1 cup skim milk (1 carbohydrate serving)  1 cup coffee  Morning Snack 1 cup sugar-free nonfat yogurt (1 carbohydrate serving)  Lunch 2 slices whole-wheat bread (2 carbohydrate servings)  2 ounces lean [place] breast  1 ounce low-fat Swiss cheese  1 teaspoon mustard  1 slice tomato  1 lettuce leaf  1 small pear (1 carbohydrate serving)  1 cup skim milk (1 carbohydrate serving)  Afternoon Snack 1 ounce trail mix with unsalted nuts, seeds, and raisins (1 carbohydrate serving)  Evening Meal 3 ounces salmon  2/3 cup cooked brown rice (2 carbohydrate servings)  1 teaspoon soft margarine  1 cup cooked broccoli with 1/2 cup cooked carrots (1 carbohydrate serving  Carrots, cooked, boiled, drained, without salt  1 cup lettuce  1 teaspoon olive oil with vinegar for dressing  1 small whole grain roll (1 carbohydrate serving)  1 teaspoon soft margarine  1 cup unsweetened tea  Evening Snack 1 extra-small banana (1 carbohydrate serving)  Copyright 2020  [employer]. All rights reserved.    Negative Pressure Wound Therapy Home Guide Negative pressure wound therapy (NPWT) uses a sponge or foam-like material (dressing) placed on or inside the wound. The wound is then covered and sealed with a cover dressing that sticks to your skin (is adhesive). This keeps air out. A tube is attached to the cover dressing, and this tube connects to a small pump. The pump sucks fluid and germs from the wound. NPWT helps to  increase blood flow to the wound and heal it from the inside. What are the risks? NPWT is usually safe to use. However, problems can occur, including:  Skin irritation from the dressing adhesive.  Bleeding.  Infection.  Dehydration. Wounds with large amounts of drainage can cause excessive fluid loss.  Pain. Supplies needed:  A disposable garbage bag.  Soap and water, or hand sanitizer.  Wound cleanser or salt-water solution (saline).  New sponge and cover dressing.  Protective clothing.  Gauze pad.  Vinyl gloves.  Tape.  Skin protectant. This may be a wipe, film, or spray.  Clean or germ-free (sterile) scissors.  Eye protection. How to change your dressing Prepare to change your dressing  6. If told by your health care provider, take pain medicine 30 minutes before changing the dressing. 7. Wash your hands with soap and water. Dry your hands with a clean towel. If soap and water are not available, use hand sanitizer. 8. Set up a clean station for wound care. 9. Open the dressing package so that the sponge dressing remains on the inside of the package. 10. Wear gloves, protective clothing, and eye protection. Remove old dressing  7. Turn off the pump and disconnect  the tubing from the dressing. 8. Carefully remove the adhesive cover dressing in the direction of your hair growth. 9. Remove the sponge dressing that is inside the wound. If the sponge sticks, use a wound cleanser or saline solution to wet the sponge and help it come off more easily. 10. Throw the old sponge and cover dressing supplies into the garbage bag. 11. Remove your gloves by grabbing the cuff and turning the glove inside out. Place the gloves in the trash immediately. 12. Wash your hands with soap and water. Dry your hands with a clean towel. If soap and water are not available, use hand sanitizer. Clean your wound  Wear gloves, protective clothing, and eye protection. Follow your health care  provider's instructions on how to clean your wound. You may be told to: 6. Clean the wound using a saline solution or a wound cleanser and a clean gauze pad. 7. Pat the wound dry with a gauze pad. Do not rub the wound. 8. Throw the gauze pad into the garbage bag. 9. Remove your gloves by grabbing the cuff and turning the glove inside out. Place the gloves in the trash immediately. 10. Wash your hands with soap and water. Dry your hands with a clean towel. If soap and water are not available, use hand sanitizer. Apply new dressing  Wear gloves, protective clothing, and eye protection. 10. If told by your health care provider, apply a skin protectant to any skin that will be exposed to adhesive. Let the skin protectant dry. 11. Cut a piece of new sponge dressing and put it on or in the wound. 12. Using clean scissors, cut a nickel-sized hole in the new cover dressing. 13. Apply the cover dressing. 14. Attach the suction tube over the hole in the cover dressing. 15. Take off your gloves. Put them in the plastic bag with the old dressing. Tie the bag shut and throw it away. 16. Wash your hands with soap and water. Dry your hands with a clean towel. If soap and water are not available, use hand sanitizer. 17. Turn the pump back on. The sponge dressing should collapse. Do not change the settings on the machine without talking to a health care provider. 18. Replace the container in the pump that collects fluid if it is full. Replace the container per the manufacturer's instructions or at least once a week, even if it is not full. General tips and recommendations If the alarm sounds:  Stay calm.  Do not turn off the pump or do anything with the dressing.  Reasons the alarm may go off: ? The battery is low. Change the battery or plug the device into electrical power. ? The dressing has a leak. Find the leak and put tape over the leak. ? The fluid collection container is full. Change the fluid  container.  Call your health care provider right away if you cannot fix the problem.  Explain to your health care provider what is happening. Follow his or her instructions. General instructions  Do not turn off the pump unless told to do so by your health care provider.  Do not turn off the pump for more than 2 hours. If the pump is off for more than 2 hours, the dressing will need to be changed.  If your health care provider says it is okay to shower: ? Do not take the pump into the shower. ? Make sure the wound dressing is protected and sealed. The wound dressing must  stay dry.  Check frequently that the machine indicates that therapy is on and that all clamps are open.  Do not use over-the-counter medicated or antiseptic creams, sprays, liquids, or dressings unless your health care provider approves. Contact a health care provider if:  You have new pain.  You develop irritation, a rash, or itching around the wound or dressing.  You see new black or yellow tissue in your wound.  The dressing changes are painful or cause bleeding.  The pump has been off for more than 2 hours, and you do not know how to change the dressing.  The pump alarm goes off, and you do not know what to do. Get help right away if:  You have a lot of bleeding.  The wound breaks open.  You have severe pain.  You have signs of infection, such as: ? More redness, swelling, or pain. ? More fluid or blood. ? Warmth. ? Pus or a bad smell. ? Red streaks leading from the wound. ? A fever.  You see a sudden change in the color or texture of the drainage.  You have signs of dehydration, such as: ? Little or no tears, urine, or sweat. ? Muscle cramps. ? Very dry mouth. ? Headache. ? Dizziness. Summary  Negative pressure wound therapy (NPWT) is a device that helps your wound heal.  Set up a clean station for wound care. Your health care provider will tell you what supplies to use.  Follow your  health care provider's instructions on how to clean your wound and how to change the dressing.  Contact a health care provider if you have new pain, an irritation, or a rash, or if the alarm goes off and you do not know what to do.  Get help right away if you have a lot of bleeding, your wound breaks open, or you have severe pain. Also, get help if you have signs of infection. This information is not intended to replace advice given to you by your health care provider. Make sure you discuss any questions you have with your health care provider. Document Revised: 11/02/2018 Document Reviewed: 09/28/2018 Elsevier Patient Education  2020 [employer].

## 2020-06-01 NOTE — Plan of Care (Signed)
  Problem: Health Behavior/Discharge Planning: Goal: Ability to manage health-related needs will improve Outcome: Not Progressing   Problem: Clinical Measurements: Goal: Ability to maintain clinical measurements within normal limits will improve Outcome: Not Progressing Goal: Will remain free from infection Outcome: Not Progressing Goal: Diagnostic test results will improve Outcome: Not Progressing Goal: Respiratory complications will improve Outcome: Not Progressing Goal: Cardiovascular complication will be avoided Outcome: Not Progressing   Problem: Nutrition: Goal: Adequate nutrition will be maintained Outcome: Not Progressing   Problem: Coping: Goal: Level of anxiety will decrease Outcome: Not Progressing   Problem: Elimination: Goal: Will not experience complications related to bowel motility Outcome: Not Progressing Goal: Will not experience complications related to urinary retention Outcome: Not Progressing   

## 2020-06-01 NOTE — Progress Notes (Signed)
PT Cancellation Note  Patient Details Name: Adam Haney MRN: [id number] DOB: [birth year]   Cancelled Treatment:    Reason Eval/Treat Not Completed: Patient at procedure or test/unavailable (pt currently awaiting VAC change momentarily)   [name] [name] 06/01/2020, 10:06 AM  [name], PT Acute Rehabilitation Services Pager: [phone] Office: [phone]

## 2020-06-01 NOTE — Consult Note (Signed)
WOC Nurse wound follow up Patient receiving care in [facility] 2W12.  Colleague, Elbert Ewings McNichol, and PAs Meuth and Osborne at bedside at time of [facility] dressing change. Wound type: Surgical right inguinal extending to the scrotum Measurement: 24 cm x 9 cm x 4.2 cm.  Width varies based on how the wound is opened during positioning. Wound bed: 100% red, clean Drainage (amount, consistency, odor) serosanginous Periwound: Two opened, flattened barrier rings placed along inferior border and along scrotal margin to facilitate seal.  Periwound is intact. Dressing procedure/placement/frequency: One medium black VAC dressing was cut into strips.  A total of 4 strips of black foam was placed into the wound, and drape applied.  Immediate seal obtained.  Patient received oral pain med prior to start of change.  Patient tolerated procedure.  Patient to be discharged today.  Helmut Muster, RN, MSN, CWOCN, CNS-BC, pager [phone]

## 2020-06-02 LAB — GLUCOSE, CAPILLARY: Glucose-Capillary: 131 mg/dL — ABNORMAL HIGH (ref 70–99)

## 2020-06-02 NOTE — Progress Notes (Signed)
Patient's discharge held up yesterday due to home health issues. These have now been resolved and he is ready for discharge. See discharge summary from yesterday.  Franne Forts, PA-C [facility] 06/02/2020, 10:52 AM Please see Amion for pager number during day hours 7:00am-4:30pm

## 2020-06-02 NOTE — Progress Notes (Signed)
0820 am-Talked to Stark Klein Management with [facility].  They were unable to verify patient's insurance over the weekend. They called the patient's insurance several times yesterday but they were having difficulty with their phone system.  Trust Loraine Leriche / Rosann Auerbach [phone]). They also reached out to the patient's employer for more insurance information but they were unable to talk.  9622 am- Received call back from Stark Klein with [facility] - Insurance verified and they will accept the patient for discharge. Alexis Goodell Transition of Care Supervisor [phone]

## 2020-06-02 NOTE — TOC Transition Note (Signed)
Transition of Care [facility]) - CM/SW Discharge Note   Patient Details  Name: Adam Haney MRN: [id number] Date of Birth: [birth year]  Transition of Care [facility]) CM/SW Contact:  Lorri Frederick, LCSW Phone Number: 06/02/2020, 10:08 AM   Clinical Narrative:   Pt discharging home today.  CSW received verification from Erin at [facility] that insurance issues have been resolved.  CSW also received call from [place] at [facility] stating the same.  Wound vac delivered last night along with supplies.  DME in room.  Pt significant other to transport home.  No other needs identified.     Final next level of care: Home w Home Health Services Barriers to Discharge: Barriers Resolved   Patient Goals and CMS Choice        Discharge Placement                       Discharge Plan and Services   Discharge Planning Services: CM Consult Post Acute Care Choice: Home Health, Durable Medical Equipment          DME Arranged: 3-N-1, Oxygen, Walker rolling DME Agency: [employer] Date DME Agency Contacted: 06/01/20 Time DME Agency Contacted: 1056 Representative spoke with at DME Agency: Velna Hatchet HH Arranged: RN HH Agency: [facility] Date HH Agency Contacted: 05/31/20 Time HH Agency Contacted: 1143 Representative spoke with at [facility] Agency: weekend coverage, Bonita Quin  Social Determinants of Health (SDOH) Interventions     Readmission Risk Interventions No flowsheet data found.

## 2020-06-03 ENCOUNTER — Encounter: Payer: Self-pay | Admitting: Family

## 2020-06-03 ENCOUNTER — Other Ambulatory Visit: Payer: Self-pay

## 2020-06-03 ENCOUNTER — Ambulatory Visit (INDEPENDENT_AMBULATORY_CARE_PROVIDER_SITE_OTHER): Payer: 59 | Admitting: Family

## 2020-06-03 VITALS — BP 121/79 | HR 101 | Temp 97.9°F | Ht 74.0 in | Wt 250.0 lb

## 2020-06-03 DIAGNOSIS — M7989 Other specified soft tissue disorders: Secondary | ICD-10-CM | POA: Diagnosis not present

## 2020-06-03 MED ORDER — AMOXICILLIN 500 MG PO CAPS: 500.0000 mg | ORAL_CAPSULE | Freq: Three times a day (TID) | ORAL | 3 refills | Status: DC

## 2020-06-03 NOTE — Patient Instructions (Signed)
Nice to meet you.  Continue to take your doxycycline and Augmentin twice daily until completed.  Switch to amoxicillin after completion of doxycycline and Augmentin.  Continue wound care and follow-up with Dr. Bedelia Person  Plan for follow-up in 1 month or sooner if needed.  Have a great day and stay safe!

## 2020-06-03 NOTE — Progress Notes (Signed)
Subjective:    Patient ID: Adam Haney, male    DOB: [birth year], 54 y.o.   MRN: [id number]  Chief Complaint  Patient presents with  . Follow-up    HPI:  Adam Haney is a 54 y.o. male with previous medical history of tobacco use, coronary artery disease, and class I obesity who was recently admitted to the hospital with right groin swelling and rash.  Noted to have leukocytosis of 23,000 and CT scan concerning for gas-forming infection.  Started on c neck fashion lindamycin with vancomycin/Zosyn.  Brought to the OR by general surgery on 11/1 and repeat debridement on 11/3.  Diagnosed with rapidly progressing necrotizing skin and soft tissue infection of the right groin which was found to be polymicrobial with Gram stain showing gram-positive cocci, gram-negative rods and gram-positive rods.  Cultures were positive for few actinomyces species.  He was changed to p.o. doxycycline and Augmentin for 7 days and scheduled for follow-up in the clinic.  Here today for hospitalization follow-up.  Adam Haney has been taking his doxycyline and Augmentin twice daily as prescribed with no adverse side effects or missed doses since leaving the hospital in the last couple days.  He has approximately 2 days worth of medication.  Overall feeling better but fatigue.  Continues to have the wound VAC in place with reddish drainage.  Denies fevers/chills.   No Known Allergies    Outpatient Medications Prior to Visit  Medication Sig Dispense Refill  . acetaminophen (TYLENOL) 500 MG tablet Take 2 tablets (1,000 mg total) by mouth every 6 (six) hours.    . clopidogrel (PLAVIX) 75 MG tablet TAKE 1 TABLET DAILY. PLEASE KEEP UPCOMING APPT IN JANUARY BEFORE ANYMORE REFILLS. FINAL ATTEMPT (Patient taking differently: Take 75 mg by mouth daily. ) 90 tablet 3  . ezetimibe (ZETIA) 10 MG tablet Take 1 tablet (10 mg total) by mouth daily. 90 tablet 1  . ibuprofen (ADVIL) 800 MG tablet Take 800 mg by mouth every 6  (six) hours as needed for headache.    . magic mouthwash w/lidocaine SOLN Take 10 mLs by mouth 4 (four) times daily as needed for mouth pain. 100 mL 0  . methocarbamol (ROBAXIN) 500 MG tablet Take 1 tablet (500 mg total) by mouth every 6 (six) hours as needed for muscle spasms. 40 tablet 0  . metoprolol tartrate (LOPRESSOR) 25 MG tablet Take 1 tablet (25 mg total) by mouth 2 (two) times daily. 180 tablet 3  . nitroGLYCERIN (NITROSTAT) 0.4 MG SL tablet Place 1 tablet (0.4 mg total) under the tongue every 5 (five) minutes as needed for chest pain. 25 tablet 2  . oxyCODONE 10 MG TABS Take 0.5-1 tablets (5-10 mg total) by mouth every 6 (six) hours as needed. 40 tablet 0  . rosuvastatin (CRESTOR) 40 MG tablet Take 1 tablet (40 mg total) by mouth daily. 90 tablet 3  . saccharomyces boulardii (FLORASTOR) 250 MG capsule Take 1 capsule (250 mg total) by mouth 2 (two) times daily.    [name]. amoxicillin-clavulanate (AUGMENTIN) 875-125 MG tablet Take 1 tablet by mouth every 12 (twelve) hours for 3 days. 6 tablet 0  . doxycycline (VIBRA-TABS) 100 MG tablet Take 1 tablet (100 mg total) by mouth 2 (two) times daily with a meal for 3 days. 6 tablet 0  . polyethylene glycol (MIRALAX / GLYCOLAX) 17 g packet Take 17 g by mouth daily. (Patient not taking: Reported on 06/03/2020) 14 each 0   No facility-administered medications prior to  visit.     Past Medical History:  Diagnosis Date  . Coronary artery disease    a. 10/2012 Inf STEMI/Cath/PCI: LM 20p, LAD 80-90p, 1m/d, D1 sm 99d, LCX mild plaque, RCA 100d (2.75x16 Promus Premier DES into PDA, 3.25x20 Promus Prem DES dRCA, 3.5x12 Promus Prem DES mRCA), EF 55-60%;  b. 10/2012 Staged PCI of LAD: 3.5x12 Promus Prem DES.  [name] Hyperlipidemia   . Leg pain    ABIs 3/16: Normal  . Nephrolithiasis    "passed on it's own" (27-Nov-2012)  . NSVT (nonsustained ventricular tachycardia) (HCC)    a. in setting of MI 10/2012 - Echo: EF 55%, mild LVH.  . Osgood-Schlatter's disease [phone]    "right knee" (11-27-2012)      Past Surgical History:  Procedure Laterality Date  . APPLICATION OF WOUND VAC Right 05/27/2020   Procedure: APPLICATION OF WOUND VAC;  Surgeon: Diamantina Monks, MD;  Location: [facility] OR;  Service: General;  Laterality: Right;  . BACK SURGERY  1995  . INCISION AND DRAINAGE OF WOUND Right 05/27/2020   Procedure: IRRIGATION AND DEBRIDEMENT OF RIGHT GROIN WOUND;  Surgeon: Diamantina Monks, MD;  Location: [facility] OR;  Service: General;  Laterality: Right;  . IRRIGATION AND DEBRIDEMENT ABSCESS Right 05/25/2020   Procedure: IRRIGATION AND DEBRIDEMENT ABSCESS GROIN;  Surgeon: Diamantina Monks, MD;  Location: [facility] OR;  Service: General;  Laterality: Right;  . LEFT HEART CATHETERIZATION WITH CORONARY ANGIOGRAM N/A 11/19/2012   Procedure: LEFT HEART CATHETERIZATION WITH CORONARY ANGIOGRAM;  Surgeon: Kathleene Hazel, MD;  Location: [facility] CATH LAB;  Service: Cardiovascular;  Laterality: N/A;  . LUMBAR DISC SURGERY  1995   "L5-S1 ruptured disc" (11-27-2012)  . PERCUTANEOUS CORONARY STENT INTERVENTION (PCI-S) N/A 11-27-2012   Procedure: PERCUTANEOUS CORONARY STENT INTERVENTION (PCI-S);  Surgeon: Kathleene Hazel, MD;  Location: [facility] CATH LAB;  Service: Cardiovascular;  Laterality: N/A;  . ULNAR COLLATERAL LIGAMENT REPAIR Left 2012      Family History  Problem Relation Age of Onset  . CAD Mother       Social History   Socioeconomic History  . Marital status: Single    Spouse name: Not on file  . Number of children: Not on file  . Years of education: Not on file  . Highest education level: Not on file  Occupational History  . Not on file  Tobacco Use  . Smoking status: Former Smoker    Packs/day: 0.50    Years: 18.00    Pack years: 9.00    Types: Cigarettes  . Smokeless tobacco: Never Used  . Tobacco comment: Nov 27, 2012 offered smoking sessation materials; pt declines  Vaping Use  . Vaping Use: Never used  Substance and Sexual Activity  . Alcohol use: Yes     Comment: socially  . Drug use: No  . Sexual activity: Yes  Other Topics Concern  . Not on file  Social History Narrative  . Not on file   Social Determinants of Health   Financial Resource Strain:   . Difficulty of Paying Living Expenses: Not on file  Food Insecurity:   . Worried About Programme researcher, broadcasting/film/video in the Last Year: Not on file  . Ran Out of Food in the Last Year: Not on file  Transportation Needs:   . Lack of Transportation (Medical): Not on file  . Lack of Transportation (Non-Medical): Not on file  Physical Activity:   . Days of Exercise per Week: Not on file  . Minutes of Exercise per  Session: Not on file  Stress:   . Feeling of Stress : Not on file  Social Connections:   . Frequency of Communication with Friends and Family: Not on file  . Frequency of Social Gatherings with Friends and Family: Not on file  . Attends Religious Services: Not on file  . Active Member of Clubs or Organizations: Not on file  . Attends Banker Meetings: Not on file  . Marital Status: Not on file  Intimate Partner Violence:   . Fear of Current or Ex-Partner: Not on file  . Emotionally Abused: Not on file  . Physically Abused: Not on file  . Sexually Abused: Not on file      Review of Systems  Constitutional: Negative for chills, diaphoresis, fatigue and fever.  Respiratory: Negative for cough, chest tightness, shortness of breath and wheezing.   Cardiovascular: Negative for chest pain.  Gastrointestinal: Negative for abdominal pain, diarrhea, nausea and vomiting.       Objective:    BP 121/79   Pulse (!) 101   Temp 97.9 F (36.6 C) (Oral)   Ht 6\' 2"  (1.88 m)   Wt 250 lb (113.4 kg)   SpO2 97%   BMI 32.10 kg/m  Nursing note and vital signs reviewed.  Physical Exam Constitutional:      General: He is not in acute distress.    Appearance: He is well-developed.     Comments: Seated in the chair; pleasant.   Cardiovascular:     Rate and Rhythm: Normal  rate and regular rhythm.     Heart sounds: Normal heart sounds.  Pulmonary:     Effort: Pulmonary effort is normal.     Breath sounds: Normal breath sounds.  Skin:    General: Skin is warm and dry.     Comments: Wound Vac in place. Reddish/purulent drainage.   Neurological:     Mental Status: He is alert and oriented to person, place, and time.  Psychiatric:        Behavior: Behavior normal.        Thought Content: Thought content normal.        Judgment: Judgment normal.         Assessment & Plan:   Patient Active Problem List   Diagnosis Date Noted  . Necrotizing soft tissue infection 05/25/2020  . Hyperglycemia 05/25/2020  . Class 1 obesity due to excess calories with body mass index (BMI) of 32.0 to 32.9 in adult 05/25/2020  . NSVT (nonsustained ventricular tachycardia) (HCC)   . Hyperlipidemia   . ST elevation myocardial infarction (STEMI) of inferior wall (HCC) 11/19/2012  . Tobacco abuse 11/19/2012  . Coronary atherosclerosis of native coronary artery 11/19/2012     Problem List Items Addressed This Visit      Other   Necrotizing soft tissue infection - Primary    Mr. Zulauf is a 54 y/o male with necrotizing soft tissue infection of the groin s/p multiple I&D procedures with cultures positive for actinomyces and gram stain showing gram positive cocci. He is nearing 10 days of doxycycline and Augmentin with no further complications. Will continue with Augmentin and doxycycline until completed and transition to amoxicillin 500 mg tid for the remainder of treatment to be around 6 months for actinomyces. Continue wound care per Dr. 40. Plan for follow up in 1 month or sooner if needed. We discussed warning signs to monitor for and when to seek further care.  I have discontinued Shepard T. Mccuen "[name]"'s amoxicillin-clavulanate and doxycycline. I am also having him start on amoxicillin. Additionally, I am having him maintain his nitroGLYCERIN, metoprolol  tartrate, rosuvastatin, clopidogrel, ezetimibe, ibuprofen, acetaminophen, magic mouthwash w/lidocaine, methocarbamol, saccharomyces boulardii, Oxycodone HCl, and polyethylene glycol.   Meds ordered this encounter  Medications  . amoxicillin (AMOXIL) 500 MG capsule    Sig: Take 1 capsule (500 mg total) by mouth 3 (three) times daily.    Dispense:  90 capsule    Refill:  3    Order Specific Question:   Supervising Provider    Answer:   Judyann Munson [[id number]]     Follow-up: Return in about 1 month (around 07/03/2020), or if symptoms worsen or fail to improve.    Marcos Eke, MSN, FNP-C Nurse Practitioner [facility] for Infectious Disease [facility] Main number: [phone]

## 2020-06-03 NOTE — Assessment & Plan Note (Signed)
Adam Haney is a 54 y/o male with necrotizing soft tissue infection of the groin s/p multiple I&D procedures with cultures positive for actinomyces and gram stain showing gram positive cocci. He is nearing 10 days of doxycycline and Augmentin with no further complications. Will continue with Augmentin and doxycycline until completed and transition to amoxicillin 500 mg tid for the remainder of treatment to be around 6 months for actinomyces. Continue wound care per Dr. Bedelia Person. Plan for follow up in 1 month or sooner if needed. We discussed warning signs to monitor for and when to seek further care.

## 2020-06-04 ENCOUNTER — Inpatient Hospital Stay: Payer: 59 | Admitting: Family

## 2020-06-11 ENCOUNTER — Other Ambulatory Visit: Payer: Self-pay | Admitting: Cardiovascular Disease

## 2020-06-21 ENCOUNTER — Other Ambulatory Visit: Payer: Self-pay | Admitting: Cardiovascular Disease

## 2020-06-24 ENCOUNTER — Other Ambulatory Visit: Payer: Self-pay | Admitting: Cardiovascular Disease

## 2020-07-02 ENCOUNTER — Encounter: Payer: Self-pay | Admitting: Family

## 2020-07-02 ENCOUNTER — Other Ambulatory Visit: Payer: Self-pay

## 2020-07-02 ENCOUNTER — Ambulatory Visit (INDEPENDENT_AMBULATORY_CARE_PROVIDER_SITE_OTHER): Payer: 59 | Admitting: Family

## 2020-07-02 VITALS — BP 103/70 | HR 87 | Temp 97.9°F | Wt 227.0 lb

## 2020-07-02 DIAGNOSIS — M7989 Other specified soft tissue disorders: Secondary | ICD-10-CM | POA: Diagnosis not present

## 2020-07-02 NOTE — Progress Notes (Signed)
Subjective:    Patient ID: Adam Haney, male    DOB: 07/27/1965, 54 y.o.   MRN: [id number]  Chief Complaint  Patient presents with  . Necrotizing Soft Tissue Infection     HPI:  Adam Haney is a 54 y.o. 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  Here today for routine follow-up.  Adam Haney has been seen by general surgery with wound VAC being removed.  Home health and wife are providing wound care per general surgery recommendations.  Continues to take his amoxicillin 3 times daily as prescribed with no adverse side effects or missed doses since he was last seen.  Overall feeling well today and has continued to progress in a positive direction.  No fevers, chills, foul odors, or drainage.  No Known Allergies    Outpatient Medications Prior to Visit  Medication Sig Dispense Refill  . acetaminophen (TYLENOL) 500 MG tablet Take 2 tablets (1,000 mg total) by mouth every 6 (six) hours.    [name] amoxicillin (AMOXIL) 500 MG capsule Take 1 capsule (500 mg total) by mouth 3 (three) times daily. 90 capsule 3  . clopidogrel (PLAVIX) 75 MG tablet TAKE 1 TABLET DAILY. PLEASE KEEP UPCOMING APPT IN JANUARY BEFORE ANYMORE REFILLS. FINAL ATTEMPT (Patient taking differently: Take 75 mg by mouth daily.) 90 tablet 3  . ezetimibe (ZETIA) 10 MG tablet TAKE 1 TABLET BY MOUTH EVERY DAY 90 tablet 0  . methocarbamol (ROBAXIN) 500 MG tablet Take 1 tablet (500 mg total) by mouth every 6 (six) hours as needed for muscle spasms. 40 tablet 0  . metoprolol tartrate (LOPRESSOR) 25 MG tablet Take 1 tablet (25 mg total) by mouth 2 (two) times daily. 180 tablet 3  . oxyCODONE 10 MG TABS Take 0.5-1 tablets (5-10 mg total) by mouth every 6 (six) hours as needed. 40 tablet 0  . rosuvastatin (CRESTOR) 40 MG tablet Take 1 tablet (40 mg total) by mouth daily. 90 tablet 3  . saccharomyces boulardii (FLORASTOR) 250 MG  capsule Take 1 capsule (250 mg total) by mouth 2 (two) times daily.    [name] ibuprofen (ADVIL) 800 MG tablet Take 800 mg by mouth every 6 (six) hours as needed for headache. (Patient not taking: Reported on 07/02/2020)    . magic mouthwash w/lidocaine SOLN Take 10 mLs by mouth 4 (four) times daily as needed for mouth pain. (Patient not taking: Reported on 07/02/2020) 100 mL 0  . nitroGLYCERIN (NITROSTAT) 0.4 MG SL tablet PLACE 1 TABLET (0.4 MG TOTAL) UNDER THE TONGUE EVERY 5 (FIVE) MINUTES AS NEEDED FOR CHEST PAIN. (Patient not taking: Reported on 07/02/2020) 75 tablet 0  . polyethylene glycol (MIRALAX / GLYCOLAX) 17 g packet Take 17 g by mouth daily. (Patient not taking: No sig reported) 14 each 0   No facility-administered medications prior to visit.     Past Medical History:  Diagnosis Date  . Coronary artery disease    a. 10/2012 Inf STEMI/Cath/PCI: LM 20p, LAD 80-90p, 47m/d, D1 sm 99d, LCX mild plaque, RCA 100d (2.75x16 Promus Premier DES into PDA, 3.25x20 Promus Prem DES dRCA, 3.5x12 Promus Prem DES mRCA), EF 55-60%;  b. 10/2012 Staged PCI of LAD: 3.5x12 Promus Prem DES.  [name] Hyperlipidemia   . Leg pain    ABIs 3/16: Normal  . Nephrolithiasis    "passed on it's own" (2012/12/07)  . NSVT (nonsustained ventricular tachycardia) (HCC)    a. in setting of  MI 10/2012 - Echo: EF 55%, mild LVH.  . Osgood-Schlatter's disease [phone]   "right knee" (11/21/2012)     Past Surgical History:  Procedure Laterality Date  . APPLICATION OF WOUND VAC Right 05/27/2020   Procedure: APPLICATION OF WOUND VAC;  Surgeon: Diamantina Monks, MD;  Location: [facility];  Service: General;  Laterality: Right;  . BACK SURGERY  1995  . INCISION AND DRAINAGE OF WOUND Right 05/27/2020   Procedure: IRRIGATION AND DEBRIDEMENT OF RIGHT GROIN WOUND;  Surgeon: Diamantina Monks, MD;  Location: [facility] OR;  Service: General;  Laterality: Right;  . IRRIGATION AND DEBRIDEMENT ABSCESS Right 05/25/2020   Procedure: IRRIGATION AND DEBRIDEMENT ABSCESS  GROIN;  Surgeon: Diamantina Monks, MD;  Location: [facility] OR;  Service: General;  Laterality: Right;  . LEFT HEART CATHETERIZATION WITH CORONARY ANGIOGRAM N/A 11/19/2012   Procedure: LEFT HEART CATHETERIZATION WITH CORONARY ANGIOGRAM;  Surgeon: Kathleene Hazel, MD;  Location: [facility] CATH LAB;  Service: Cardiovascular;  Laterality: N/A;  . LUMBAR DISC SURGERY  1995   "L5-S1 ruptured disc" (11/21/2012)  . PERCUTANEOUS CORONARY STENT INTERVENTION (PCI-S) N/A 11/21/2012   Procedure: PERCUTANEOUS CORONARY STENT INTERVENTION (PCI-S);  Surgeon: Kathleene Hazel, MD;  Location: [facility] CATH LAB;  Service: Cardiovascular;  Laterality: N/A;  . ULNAR COLLATERAL LIGAMENT REPAIR Left 2012     Review of Systems  Constitutional: Negative for chills, diaphoresis, fatigue and fever.  Respiratory: Negative for cough, chest tightness, shortness of breath and wheezing.   Cardiovascular: Negative for chest pain.  Gastrointestinal: Negative for abdominal pain, diarrhea, nausea and vomiting.  Genitourinary:       Positive for wound      Objective:    BP 103/70   Pulse 87   Temp 97.9 F (36.6 C) (Oral)   Wt 227 lb (103 kg)   BMI 29.15 kg/m  Nursing note and vital signs reviewed.  Physical Exam Constitutional:      General: He is not in acute distress.    Appearance: He is well-developed and well-nourished.  Cardiovascular:     Rate and Rhythm: Normal rate and regular rhythm.     Pulses: Intact distal pulses.     Heart sounds: Normal heart sounds.  Pulmonary:     Effort: Pulmonary effort is normal.     Breath sounds: Normal breath sounds.  Genitourinary:    Comments: Wound packed and dressed. Skin:    General: Skin is warm and dry.  Neurological:     Mental Status: He is alert and oriented to person, place, and time.  Psychiatric:        Mood and Affect: Mood and affect normal.        Behavior: Behavior normal.        Thought Content: Thought content normal.        Judgment: Judgment normal.       Depression screen PHQ 2/9 06/03/2020  Decreased Interest 0  Down, Depressed, Hopeless 0  PHQ - 2 Score 0       Assessment & Plan:    Patient Active Problem List   Diagnosis Date Noted  . Necrotizing soft tissue infection 05/25/2020  . Hyperglycemia 05/25/2020  . Class 1 obesity due to excess calories with body mass index (BMI) of 32.0 to 32.9 in adult 05/25/2020  . NSVT (nonsustained ventricular tachycardia) (HCC)   . Hyperlipidemia   . ST elevation myocardial infarction (STEMI) of inferior wall (HCC) 11/19/2012  . Tobacco abuse 11/19/2012  . Coronary atherosclerosis of native coronary  artery 11/19/2012     Problem List Items Addressed This Visit      Other   Necrotizing soft tissue infection - Primary    Adam Haney continues to receive his amoxicillin good tolerance for necrotizing soft tissue infection.  Wound is healing well and no longer on wound VAC.  Per report measurements are down over half since previous visit.  No systemic symptoms.  Discussed plan of care to include continued amoxicillin for the next 5 months secondary to actinomyces infection.  Refills sent to the pharmacy.  Continue wound care per general surgery.  Follow-up in 2 months or sooner if needed.         I am having Adam Haney. Adam "[name]" maintain his metoprolol tartrate, rosuvastatin, clopidogrel, ibuprofen, acetaminophen, magic mouthwash w/lidocaine, methocarbamol, saccharomyces boulardii, Oxycodone HCl, polyethylene glycol, amoxicillin, ezetimibe, and nitroGLYCERIN.   Follow-up: Return in about 2 months (around 09/02/2020), or if symptoms worsen or fail to improve.   Marcos Eke, MSN, FNP-C Nurse Practitioner [facility] Main number: [phone]

## 2020-07-02 NOTE — Patient Instructions (Signed)
Nice to see you.  Continue to take the Amoxicillin 3x daily.  Refills are at the pharmacy.   Continue wound care per Dr. Bedelia Person.   Plan for follow up in 2 months or sooner if needed.  Have a great day and stay safe!  Happy Holidays!

## 2020-07-02 NOTE — Assessment & Plan Note (Signed)
Adam Haney continues to receive his amoxicillin good tolerance for necrotizing soft tissue infection.  Wound is healing well and no longer on wound VAC.  Per report measurements are down over half since previous visit.  No systemic symptoms.  Discussed plan of care to include continued amoxicillin for the next 5 months secondary to actinomyces infection.  Refills sent to the pharmacy.  Continue wound care per general surgery.  Follow-up in 2 months or sooner if needed.

## 2020-07-24 ENCOUNTER — Other Ambulatory Visit: Payer: Self-pay | Admitting: Cardiovascular Disease

## 2020-08-06 ENCOUNTER — Ambulatory Visit (INDEPENDENT_AMBULATORY_CARE_PROVIDER_SITE_OTHER): Payer: 59 | Admitting: Cardiovascular Disease

## 2020-08-06 ENCOUNTER — Encounter: Payer: Self-pay | Admitting: Cardiovascular Disease

## 2020-08-06 ENCOUNTER — Other Ambulatory Visit: Payer: Self-pay

## 2020-08-06 VITALS — BP 132/84 | HR 89 | Ht 74.0 in | Wt 260.0 lb

## 2020-08-06 DIAGNOSIS — E7800 Pure hypercholesterolemia, unspecified: Secondary | ICD-10-CM | POA: Diagnosis not present

## 2020-08-06 DIAGNOSIS — I2510 Atherosclerotic heart disease of native coronary artery without angina pectoris: Secondary | ICD-10-CM | POA: Diagnosis not present

## 2020-08-06 NOTE — Patient Instructions (Signed)
Medication Instructions:  Your physician recommends that you continue on your current medications as directed. Please refer to the Current Medication list given to you today.  *If you need a refill on your cardiac medications before your next appointment, please call your pharmacy*   Lab Work: Lab work to be done today--Lipid and liver profiles If you have labs (blood work) drawn today and your tests are completely normal, you will receive your results only by: [name] MyChart Message (if you have MyChart) OR . A paper copy in the mail If you have any lab test that is abnormal or we need to change your treatment, we will call you to review the results.   Testing/Procedures: none   Follow-Up: At [facility], you and your health needs are our priority.  As part of our continuing mission to provide you with exceptional heart care, we have created designated Provider Care Teams.  These Care Teams include your primary Cardiologist (physician) and Advanced Practice Providers (APPs -  Physician Assistants and Nurse Practitioners) who all work together to provide you with the care you need, when you need it.  We recommend signing up for the patient portal called "MyChart".  Sign up information is provided on this After Visit Summary.  MyChart is used to connect with patients for Virtual Visits (Telemedicine).  Patients are able to view lab/test results, encounter notes, upcoming appointments, etc.  Non-urgent messages can be sent to your provider as well.   To learn more about what you can do with MyChart, go to [email or web address].    Your next appointment:   12 month(s)  The format for your next appointment:   In Person  Provider:   You may see Verne Carrow, MD or one of the following Advanced Practice Providers on your designated Care Team:    Ronie Spies, PA-C  Jacolyn Reedy, PA-C    Other Instructions

## 2020-08-06 NOTE — Progress Notes (Signed)
Chief Complaint  Patient presents with  . Follow-up    CAD    History of Present Illness: 55 yo male with history of CAD, tobacco abuse and HLD here today for cardiac follow up. He was admitted to [facility] 11/19/12 with an inferior STEMI. Cardiac cath 11/19/12 with 90% mid RCA stenosis, 100% distal RCA stenosis. The distal vessel was treated with overlapping drug eluting stents and the mid vessel was treated with a drug eluting stent. He was brought back Dec 12, 2012 for staged PCI of the severe ostial LAD stenosis treated with a DES x 1. He had residual high grade disease in the small caliber diagonal branch which was felt to be too small for PCI. LVEF 55% by echo post cath. ABI normal March 2016. Exercise stress test in 2016 without ischemia. He was treated in October 2021 for a right groin abscess that required surgery and two months to heal the wound.   He is here today for follow up. The patient denies any chest pain, dyspnea, palpitations, lower extremity edema, orthopnea, PND, dizziness, near syncope or syncope.    Primary Care Physician: [name], MD  Past Medical History:  Diagnosis Date  . Coronary artery disease    a. 10/2012 Inf STEMI/Cath/PCI: LM 20p, LAD 80-90p, 53m/d, D1 sm 99d, LCX mild plaque, RCA 100d (2.75x16 Promus Premier DES into PDA, 3.25x20 Promus Prem DES dRCA, 3.5x12 Promus Prem DES mRCA), EF 55-60%;  b. 10/2012 Staged PCI of LAD: 3.5x12 Promus Prem DES.  [name] Hyperlipidemia   . Leg pain    ABIs 3/16: Normal  . Nephrolithiasis    "passed on it's own" (Dec 12, 2012)  . NSVT (nonsustained ventricular tachycardia) (HCC)    a. in setting of MI 10/2012 - Echo: EF 55%, mild LVH.  . Osgood-Schlatter's disease [phone]   "right knee" (12-12-2012)    Past Surgical History:  Procedure Laterality Date  . APPLICATION OF WOUND VAC Right 05/27/2020   Procedure: APPLICATION OF WOUND VAC;  Surgeon: Diamantina Monks, MD;  Location: [facility] OR;  Service: General;  Laterality: Right;  . BACK  SURGERY  1995  . INCISION AND DRAINAGE OF WOUND Right 05/27/2020   Procedure: IRRIGATION AND DEBRIDEMENT OF RIGHT GROIN WOUND;  Surgeon: Diamantina Monks, MD;  Location: [facility] OR;  Service: General;  Laterality: Right;  . IRRIGATION AND DEBRIDEMENT ABSCESS Right 05/25/2020   Procedure: IRRIGATION AND DEBRIDEMENT ABSCESS GROIN;  Surgeon: Diamantina Monks, MD;  Location: [facility] OR;  Service: General;  Laterality: Right;  . LEFT HEART CATHETERIZATION WITH CORONARY ANGIOGRAM N/A 11/19/2012   Procedure: LEFT HEART CATHETERIZATION WITH CORONARY ANGIOGRAM;  Surgeon: Kathleene Hazel, MD;  Location: [facility] CATH LAB;  Service: Cardiovascular;  Laterality: N/A;  . LUMBAR DISC SURGERY  1995   "L5-S1 ruptured disc" (2012-12-12)  . PERCUTANEOUS CORONARY STENT INTERVENTION (PCI-S) N/A 2012/12/12   Procedure: PERCUTANEOUS CORONARY STENT INTERVENTION (PCI-S);  Surgeon: Kathleene Hazel, MD;  Location: [facility] CATH LAB;  Service: Cardiovascular;  Laterality: N/A;  . ULNAR COLLATERAL LIGAMENT REPAIR Left 2012    Current Outpatient Medications  Medication Sig Dispense Refill  . acetaminophen (TYLENOL) 500 MG tablet Take 2 tablets (1,000 mg total) by mouth every 6 (six) hours.    [name] amoxicillin (AMOXIL) 500 MG capsule Take 1 capsule (500 mg total) by mouth 3 (three) times daily. 90 capsule 3  . clopidogrel (PLAVIX) 75 MG tablet TAKE 1 TABLET DAILY. PLEASE KEEP UPCOMING APPT IN JANUARY BEFORE ANYMORE REFILLS. FINAL ATTEMPT 90 tablet 3  .  ezetimibe (ZETIA) 10 MG tablet TAKE 1 TABLET BY MOUTH EVERY DAY 90 tablet 0  . ibuprofen (ADVIL) 800 MG tablet Take 800 mg by mouth every 6 (six) hours as needed for headache.    . metoprolol tartrate (LOPRESSOR) 25 MG tablet Take 1 tablet (25 mg total) by mouth 2 (two) times daily. 180 tablet 3  . nitroGLYCERIN (NITROSTAT) 0.4 MG SL tablet PLACE 1 TABLET UNDER THE TONGUE EVERY 5 MINUTES AS NEEDED FOR CHEST PAIN 25 tablet 3  . rosuvastatin (CRESTOR) 40 MG tablet Take 1 tablet (40 mg total)  by mouth daily. 90 tablet 3  . saccharomyces boulardii (FLORASTOR) 250 MG capsule Take 1 capsule (250 mg total) by mouth 2 (two) times daily.     No current facility-administered medications for this visit.    No Known Allergies  Social History   Socioeconomic History  . Marital status: Single    Spouse name: Not on file  . Number of children: Not on file  . Years of education: Not on file  . Highest education level: Not on file  Occupational History  . Not on file  Tobacco Use  . Smoking status: Former Smoker    Packs/day: 0.50    Years: 18.00    Pack years: 9.00    Types: Cigarettes  . Smokeless tobacco: Never Used  . Tobacco comment: 11/21/2012 offered smoking sessation materials; pt declines  Vaping Use  . Vaping Use: Never used  Substance and Sexual Activity  . Alcohol use: Yes    Comment: socially  . Drug use: No  . Sexual activity: Yes  Other Topics Concern  . Not on file  Social History Narrative  . Not on file   Social Determinants of Health   Financial Resource Strain: Not on file  Food Insecurity: Not on file  Transportation Needs: Not on file  Physical Activity: Not on file  Stress: Not on file  Social Connections: Not on file  Intimate Partner Violence: Not on file    Family History  Problem Relation Age of Onset  . CAD Mother     Review of Systems:  As stated in the HPI and otherwise negative.   BP 132/84   Pulse 89   Ht 6\' 2"  (1.88 m)   Wt 260 lb (117.9 kg)   SpO2 97%   BMI 33.38 kg/m   Physical Examination:  General: Well developed, well nourished, NAD  HEENT: OP clear, mucus membranes moist  SKIN: warm, dry. No rashes. Neuro: No focal deficits  Musculoskeletal: Muscle strength 5/5 all ext  Psychiatric: Mood and affect normal  Neck: No JVD, no carotid bruits, no thyromegaly, no lymphadenopathy.  Lungs:Clear bilaterally, no wheezes, rhonci, crackles Cardiovascular: Regular rate and rhythm. No murmurs, gallops or  rubs. Abdomen:Soft. Bowel sounds present. Non-tender.  Extremities: No lower extremity edema. Pulses are 2 + in the bilateral DP/PT.  Cardiac cath 11/19/12: Left main: 20% proximal stenosis.  Left Anterior Descending Artery: Large caliber vessel that courses to the apex. 80-90% proximal stenosis. The mid and distal vessel has serial 30% stenoses. The first diagonal branch is small to moderate in caliber with 99% stenosis in the distal portion of the vessel where it becomes very small caliber (1.0 mm).  Circumflex Artery: Large caliber vessel with early moderate caliber intermediate branch with mild plaque. The continuation of the AV groove Circumflex is small to moderate in caliber with mild diffuse plaque.  Right Coronary Artery: Large dominant vessel with mild proximal plaque, 90%  mid stenosis, 100% occlusion distal vessel before the bifurcation.  Left Ventricular Angiogram: LVEF 55-60%.   EKG:  EKG is ordered today. The ekg ordered today demonstrates sinus rhythm  Recent Labs: 05/31/2020: Hemoglobin 12.7; Platelets 225 06/01/2020: ALT 180; BUN 15; Creatinine, Ser 0.73; Potassium 4.1; Sodium 131   Lipid Panel    Component Value Date/Time   CHOL 125 08/07/2019 0844   TRIG 55 08/07/2019 0844   HDL 43 08/07/2019 0844   CHOLHDL 2.9 08/07/2019 0844   CHOLHDL 3.9 10/14/2015 0822   VLDL 23 10/14/2015 0822   LDLCALC 70 08/07/2019 0844     Wt Readings from Last 3 Encounters:  08/06/20 260 lb (117.9 kg)  07/02/20 227 lb (103 kg)  06/03/20 250 lb (113.4 kg)     Other studies Reviewed: Additional studies/ records that were reviewed today include: . Review of the above records demonstrates:    Assessment and Plan:   1. CAD without angina: He has no chest pain. Will continue Plavix, statin and beta blocker.     2. Tobacco abuse, in remission: He no longer smokes.   3. Hyperlipidemia: LDL at goal in January 2021. Continue statin. Repeat lipids and LFTs now.   Current medicines are  reviewed at length with the patient today.  The patient does not have concerns regarding medicines.  The following changes have been made:  no change  Labs/ tests ordered today include:   Orders Placed This Encounter  Procedures  . Lipid Profile  . Hepatic function panel  . EKG 12-Lead    Disposition:   FU with me in 12  months  Signed, Verne Carrow, MD 08/06/2020 10:21 AM    [facility] [address] [place], [place], [place]  [address] Phone: [phone]; Fax: [phone]

## 2020-08-07 LAB — HEPATIC FUNCTION PANEL
ALT: 15 IU/L (ref 0–44)
AST: 18 IU/L (ref 0–40)
Albumin: 4 g/dL (ref 3.8–4.9)
Alkaline Phosphatase: 102 IU/L (ref 44–121)
Bilirubin Total: 0.6 mg/dL (ref 0.0–1.2)
Bilirubin, Direct: 0.19 mg/dL (ref 0.00–0.40)
Total Protein: 6.7 g/dL (ref 6.0–8.5)

## 2020-08-07 LAB — LIPID PANEL
Chol/HDL Ratio: 2.9 ratio (ref 0.0–5.0)
Cholesterol, Total: 141 mg/dL (ref 100–199)
HDL: 49 mg/dL (ref >39)
LDL Chol Calc (NIH): 75 mg/dL (ref 0–99)
Triglycerides: 89 mg/dL (ref 0–149)
VLDL Cholesterol Cal: 17 mg/dL (ref 5–40)

## 2020-09-14 ENCOUNTER — Ambulatory Visit: Payer: 59 | Admitting: Family

## 2020-09-15 ENCOUNTER — Ambulatory Visit (INDEPENDENT_AMBULATORY_CARE_PROVIDER_SITE_OTHER): Payer: 59 | Admitting: Family

## 2020-09-15 ENCOUNTER — Other Ambulatory Visit: Payer: Self-pay

## 2020-09-15 ENCOUNTER — Encounter: Payer: Self-pay | Admitting: Family

## 2020-09-15 VITALS — BP 121/76 | HR 87 | Temp 97.8°F | Ht 74.0 in | Wt 269.0 lb

## 2020-09-15 DIAGNOSIS — M7989 Other specified soft tissue disorders: Secondary | ICD-10-CM | POA: Diagnosis not present

## 2020-09-15 DIAGNOSIS — A429 Actinomycosis, unspecified: Secondary | ICD-10-CM

## 2020-09-15 NOTE — Progress Notes (Signed)
Subjective:    Patient ID: Adam Haney, male    DOB: 1966-05-06, 55 y.o.   MRN: [id number]  Chief Complaint  Patient presents with  . Follow-up     HPI:  Adam Haney is a 55 y.o. male with necrotizing soft tissue infection last seen on 07/02/21 on amoxicillin for Actinomyces infection with good wound healing and having been taken off the wound vac. Here today for follow up.   Adam Haney continues to take his amoxicillin as prescribed with some nausea after each dose that is tolerable and goes away within about 20 to 30 minutes.  Denies diarrhea or looser stools.  He went back to work on January 3 as the wound has closed since his last office visit.  He does have some occasional tenderness at the site of surgery.  Denies fevers or chills.  Has been having right lower extremity swelling that is worse over the course of the day and improves with elevation.  He has been wearing a compression sock on the right leg.  No Known Allergies    Outpatient Medications Prior to Visit  Medication Sig Dispense Refill  . acetaminophen (TYLENOL) 500 MG tablet Take 2 tablets (1,000 mg total) by mouth every 6 (six) hours.    Adam Haney amoxicillin (AMOXIL) 500 MG capsule Take 1 capsule (500 mg total) by mouth 3 (three) times daily. 90 capsule 3  . clopidogrel (PLAVIX) 75 MG tablet TAKE 1 TABLET DAILY. PLEASE KEEP UPCOMING APPT IN JANUARY BEFORE ANYMORE REFILLS. FINAL ATTEMPT 90 tablet 3  . ezetimibe (ZETIA) 10 MG tablet TAKE 1 TABLET BY MOUTH EVERY DAY 90 tablet 0  . ibuprofen (ADVIL) 800 MG tablet Take 800 mg by mouth every 6 (six) hours as needed for headache.    . metoprolol tartrate (LOPRESSOR) 25 MG tablet Take 1 tablet (25 mg total) by mouth 2 (two) times daily. 180 tablet 3  . nitroGLYCERIN (NITROSTAT) 0.4 MG SL tablet PLACE 1 TABLET UNDER THE TONGUE EVERY 5 MINUTES AS NEEDED FOR CHEST PAIN 25 tablet 3  . rosuvastatin (CRESTOR) 40 MG tablet Take 1 tablet (40 mg total) by mouth daily. 90 tablet 3  .  saccharomyces boulardii (FLORASTOR) 250 MG capsule Take 1 capsule (250 mg total) by mouth 2 (two) times daily.     No facility-administered medications prior to visit.     Past Medical History:  Diagnosis Date  . Coronary artery disease    a. 10/2012 Inf STEMI/Cath/PCI: LM 20p, LAD 80-90p, 5m/d, D1 sm 99d, LCX mild plaque, RCA 100d (2.75x16 Promus Premier DES into PDA, 3.25x20 Promus Prem DES dRCA, 3.5x12 Promus Prem DES mRCA), EF 55-60%;  b. 10/2012 Staged PCI of LAD: 3.5x12 Promus Prem DES.  Adam Haney Hyperlipidemia   . Leg pain    ABIs 3/16: Normal  . Nephrolithiasis    "passed on it's own" (12-14-12)  . NSVT (nonsustained ventricular tachycardia) (HCC)    a. in setting of MI 10/2012 - Echo: EF 55%, mild LVH.  . Osgood-Schlatter's disease [phone]   "right knee" (Dec 14, 2012)     Past Surgical History:  Procedure Laterality Date  . APPLICATION OF WOUND VAC Right 05/27/2020   Procedure: APPLICATION OF WOUND VAC;  Surgeon: Adam Monks, MD;  [facility];  Service: General;  Laterality: Right;  . BACK SURGERY  1995  . INCISION AND DRAINAGE OF WOUND Right 05/27/2020   Procedure: IRRIGATION AND DEBRIDEMENT OF RIGHT GROIN WOUND;  Surgeon: Adam Monks, MD;  Location: [facility]  OR;  Service: General;  Laterality: Right;  . IRRIGATION AND DEBRIDEMENT ABSCESS Right 05/25/2020   Procedure: IRRIGATION AND DEBRIDEMENT ABSCESS GROIN;  Surgeon: Adam Monks, MD;  [facility];  Service: General;  Laterality: Right;  . LEFT HEART CATHETERIZATION WITH CORONARY ANGIOGRAM N/A 11/19/2012   Procedure: LEFT HEART CATHETERIZATION WITH CORONARY ANGIOGRAM;  Surgeon: Adam Hazel, MD;  Location: [facility] CATH LAB;  Service: Cardiovascular;  Laterality: N/A;  . LUMBAR DISC SURGERY  1995   "L5-S1 ruptured disc" (11/21/2012)  . PERCUTANEOUS CORONARY STENT INTERVENTION (PCI-S) N/A 11/21/2012   Procedure: PERCUTANEOUS CORONARY STENT INTERVENTION (PCI-S);  Surgeon: Adam Hazel, MD;  Location: [facility] CATH  LAB;  Service: Cardiovascular;  Laterality: N/A;  . ULNAR COLLATERAL LIGAMENT REPAIR Left 2012       Review of Systems  Constitutional: Negative for chills, diaphoresis, fatigue and fever.  Respiratory: Negative for cough, chest tightness, shortness of breath and wheezing.   Cardiovascular: Negative for chest pain.  Gastrointestinal: Negative for abdominal pain, diarrhea, nausea and vomiting.      Objective:    BP 121/76   Pulse 87   Temp 97.8 F (36.6 C) (Oral)   Ht 6\' 2"  (1.88 m)   Wt 269 lb (122 kg)   BMI 34.54 kg/m  Nursing note and vital signs reviewed.  Physical Exam Constitutional:      General: He is not in acute distress.    Appearance: He is well-developed and well-nourished.  Cardiovascular:     Rate and Rhythm: Normal rate and regular rhythm.     Pulses: Intact distal pulses.     Heart sounds: Normal heart sounds.  Pulmonary:     Effort: Pulmonary effort is normal.     Breath sounds: Normal breath sounds.  Skin:    General: Skin is warm and dry.  Neurological:     Mental Status: He is alert and oriented to person, place, and time.  Psychiatric:        Mood and Affect: Mood and affect normal.        Behavior: Behavior normal.        Thought Content: Thought content normal.        Judgment: Judgment normal.      Depression screen PHQ 2/9 06/03/2020  Decreased Interest 0  Down, Depressed, Hopeless 0  PHQ - 2 Score 0       Assessment & Plan:    Patient Active Problem List   Diagnosis Date Noted  . Necrotizing soft tissue infection 05/25/2020  . Hyperglycemia 05/25/2020  . Class 1 obesity due to excess calories with body mass index (BMI) of 32.0 to 32.9 in adult 05/25/2020  . NSVT (nonsustained ventricular tachycardia) (HCC)   . Hyperlipidemia   . ST elevation myocardial infarction (STEMI) of inferior wall (HCC) 11/19/2012  . Tobacco abuse 11/19/2012  . Coronary atherosclerosis of native coronary artery 11/19/2012     Problem List Items  Addressed This Visit      Other   Necrotizing soft tissue infection - Primary    Adam Haney wound has healed over with only mild tenderness at the site of surgery and now experiencing right-sided lower extremity swelling that is worse throughout the day and better in the morning.  He currently has no signs of infection and will continue amoxicillin for the remaining 2 months for his actinomyces infection.  Discussed continued use of right leg compression sock as needed as well as elevation when able.  Plan for follow-up in  2 months or sooner if needed.       Other Visit Diagnoses    Actinomyces infection           I am having Ethelene Browns T. Mednick "[name]" maintain his metoprolol tartrate, rosuvastatin, clopidogrel, ibuprofen, acetaminophen, saccharomyces boulardii, amoxicillin, ezetimibe, and nitroGLYCERIN.   No orders of the defined types were placed in this encounter.    Follow-up: Return in about 2 months (around 11/13/2020), or if symptoms worsen or fail to improve.   Marcos Eke, MSN, FNP-C Nurse Practitioner [facility] Main number: [phone]

## 2020-09-15 NOTE — Patient Instructions (Addendum)
Nice to see you.  Continue to take your amoxicillin as prescribed.   Keep your leg elevated when able and keep wearing the compression socks.  Plan for follow up in 2 months or sooner if needed.   Have a great day and stay safe!

## 2020-09-15 NOTE — Assessment & Plan Note (Signed)
Adam Haney wound has healed over with only mild tenderness at the site of surgery and now experiencing right-sided lower extremity swelling that is worse throughout the day and better in the morning.  He currently has no signs of infection and will continue amoxicillin for the remaining 2 months for his actinomyces infection.  Discussed continued use of right leg compression sock as needed as well as elevation when able.  Plan for follow-up in 2 months or sooner if needed.

## 2020-09-22 ENCOUNTER — Other Ambulatory Visit: Payer: Self-pay | Admitting: Cardiovascular Disease

## 2020-09-25 ENCOUNTER — Other Ambulatory Visit: Payer: Self-pay | Admitting: Cardiovascular Disease

## 2020-10-12 ENCOUNTER — Other Ambulatory Visit: Payer: Self-pay | Admitting: Family

## 2020-11-13 ENCOUNTER — Other Ambulatory Visit: Payer: Self-pay

## 2020-11-13 ENCOUNTER — Ambulatory Visit (INDEPENDENT_AMBULATORY_CARE_PROVIDER_SITE_OTHER): Payer: 59 | Admitting: Family

## 2020-11-13 ENCOUNTER — Encounter: Payer: Self-pay | Admitting: Family

## 2020-11-13 VITALS — BP 122/75 | HR 76 | Temp 97.8°F | Resp 16 | Ht 74.0 in | Wt 268.0 lb

## 2020-11-13 DIAGNOSIS — A429 Actinomycosis, unspecified: Secondary | ICD-10-CM

## 2020-11-13 DIAGNOSIS — M7989 Other specified soft tissue disorders: Secondary | ICD-10-CM

## 2020-11-13 NOTE — Progress Notes (Signed)
Subjective:    Patient ID: Adam Haney, male    DOB: [birth year], 55 y.o.   MRN: [id number]  Chief Complaint  Patient presents with  . Follow-up     Month f/u     HPI:  Adam Haney is a 55 y.o. male with necrotizing soft tissue infection with actinomyces status post I&D who was last seen on 09/15/2020 with good adherence and tolerance to his antimicrobial regimen of amoxicillin.  Continue to have mild surgical site tenderness with no overt evidence of infection.  Here today for routine follow-up.  Adam Haney continues to take his amoxicillin as prescribed with no adverse side effects or missed doses since his last office visit.  Overall feeling well today.  Does continue to have tenderness around the surgical site as well as the previously described lower extremity edema that is worse throughout the day and better in the morning.  He has been using compression socks.  He is able to function in his normal day-to-day activities as well as work.  Denies any fevers, chills, or worsening pain.  No Known Allergies    Outpatient Medications Prior to Visit  Medication Sig Dispense Refill  . acetaminophen (TYLENOL) 500 MG tablet Take 2 tablets (1,000 mg total) by mouth every 6 (six) hours.    [name] amoxicillin (AMOXIL) 500 MG capsule TAKE 1 CAPSULE BY MOUTH THREE TIMES A DAY 90 capsule 2  . clopidogrel (PLAVIX) 75 MG tablet Take 1 tablet (75 mg total) by mouth daily. 90 tablet 3  . ezetimibe (ZETIA) 10 MG tablet TAKE 1 TABLET BY MOUTH EVERY DAY 90 tablet 3  . ibuprofen (ADVIL) 800 MG tablet Take 800 mg by mouth every 6 (six) hours as needed for headache.    . metoprolol tartrate (LOPRESSOR) 25 MG tablet TAKE 1 TABLET BY MOUTH TWICE A DAY 180 tablet 3  . nitroGLYCERIN (NITROSTAT) 0.4 MG SL tablet PLACE 1 TABLET UNDER THE TONGUE EVERY 5 MINUTES AS NEEDED FOR CHEST PAIN 25 tablet 3  . rosuvastatin (CRESTOR) 40 MG tablet TAKE 1 TABLET BY MOUTH EVERY DAY 90 tablet 3  . saccharomyces boulardii  (FLORASTOR) 250 MG capsule Take 1 capsule (250 mg total) by mouth 2 (two) times daily.     No facility-administered medications prior to visit.     Past Medical History:  Diagnosis Date  . Coronary artery disease    a. 10/2012 Inf STEMI/Cath/PCI: LM 20p, LAD 80-90p, 38m/d, D1 sm 99d, LCX mild plaque, RCA 100d (2.75x16 Promus Premier DES into PDA, 3.25x20 Promus Prem DES dRCA, 3.5x12 Promus Prem DES mRCA), EF 55-60%;  b. 10/2012 Staged PCI of LAD: 3.5x12 Promus Prem DES.  [name] Hyperlipidemia   . Leg pain    ABIs 3/16: Normal  . Nephrolithiasis    "passed on it's own" (Dec 16, 2012)  . NSVT (nonsustained ventricular tachycardia) (HCC)    a. in setting of MI 10/2012 - Echo: EF 55%, mild LVH.  . Osgood-Schlatter's disease [phone]   "right knee" (12-16-12)     Past Surgical History:  Procedure Laterality Date  . APPLICATION OF WOUND VAC Right 05/27/2020   Procedure: APPLICATION OF WOUND VAC;  Surgeon: Diamantina Monks, MD;  [facility];  Service: General;  Laterality: Right;  . BACK SURGERY  1995  . INCISION AND DRAINAGE OF WOUND Right 05/27/2020   Procedure: IRRIGATION AND DEBRIDEMENT OF RIGHT GROIN WOUND;  Surgeon: Diamantina Monks, MD;  Location: [facility] OR;  Service: General;  Laterality: Right;  .  IRRIGATION AND DEBRIDEMENT ABSCESS Right 05/25/2020   Procedure: IRRIGATION AND DEBRIDEMENT ABSCESS GROIN;  Surgeon: Diamantina Monks, MD;  Location: [facility] OR;  Service: General;  Laterality: Right;  . LEFT HEART CATHETERIZATION WITH CORONARY ANGIOGRAM N/A 11/19/2012   Procedure: LEFT HEART CATHETERIZATION WITH CORONARY ANGIOGRAM;  Surgeon: Kathleene Hazel, MD;  Location: [facility] CATH LAB;  Service: Cardiovascular;  Laterality: N/A;  . LUMBAR DISC SURGERY  1995   "L5-S1 ruptured disc" (11/21/2012)  . PERCUTANEOUS CORONARY STENT INTERVENTION (PCI-S) N/A 11/21/2012   Procedure: PERCUTANEOUS CORONARY STENT INTERVENTION (PCI-S);  Surgeon: Kathleene Hazel, MD;  Location: [facility] CATH LAB;  Service:  Cardiovascular;  Laterality: N/A;  . ULNAR COLLATERAL LIGAMENT REPAIR Left 2012       Review of Systems  Constitutional: Negative for chills, diaphoresis, fatigue and fever.  Respiratory: Negative for cough, chest tightness, shortness of breath and wheezing.   Cardiovascular: Positive for leg swelling. Negative for chest pain.  Gastrointestinal: Negative for abdominal pain, diarrhea, nausea and vomiting.      Objective:    BP 122/75   Pulse 76   Temp 97.8 F (36.6 C)   Resp 16   Ht 6\' 2"  (1.88 m)   Wt 268 lb (121.6 kg)   SpO2 95%   BMI 34.41 kg/m  Nursing note and vital signs reviewed.  Physical Exam Constitutional:      General: He is not in acute distress.    Appearance: He is well-developed.     Comments: Seated in the chair; pleasant.   Cardiovascular:     Rate and Rhythm: Normal rate and regular rhythm.     Heart sounds: Normal heart sounds.  Pulmonary:     Effort: Pulmonary effort is normal.     Breath sounds: Normal breath sounds.  Musculoskeletal:     Comments: Mild non-pitting lower extremity edema on the right.   Skin:    General: Skin is warm and dry.  Neurological:     Mental Status: He is alert and oriented to person, place, and time.  Psychiatric:        Behavior: Behavior normal.        Thought Content: Thought content normal.        Judgment: Judgment normal.      Depression screen [facility] 2/9 11/13/2020 06/03/2020  Decreased Interest 0 0  Down, Depressed, Hopeless 0 0  PHQ - 2 Score 0 0       Assessment & Plan:    Patient Active Problem List   Diagnosis Date Noted  . Necrotizing soft tissue infection 05/25/2020  . Hyperglycemia 05/25/2020  . Class 1 obesity due to excess calories with body mass index (BMI) of 32.0 to 32.9 in adult 05/25/2020  . NSVT (nonsustained ventricular tachycardia) (HCC)   . Hyperlipidemia   . ST elevation myocardial infarction (STEMI) of inferior wall (HCC) 11/19/2012  . Tobacco abuse 11/19/2012  . Coronary  atherosclerosis of native coronary artery 11/19/2012     Problem List Items Addressed This Visit      Other   Necrotizing soft tissue infection - Primary    Continues to do well with good adherence and tolerance to his antimicrobial regimen of amoxicillin.  Continues to have lower extremity edema of unclear origin that is improved with elevation.  No systemic symptoms of fever/chills.  Given severity of his infection will continue amoxicillin for 1 additional month and then stop medication.  Check lab work today including inflammatory markers and CBC.  Plan for follow-up  in 2 months or sooner if needed.      Relevant Orders   CBC   C-reactive protein   Sedimentation rate    Other Visit Diagnoses    Actinomyces infection       Relevant Orders   CBC   C-reactive protein   Sedimentation rate       I have discontinued Ethelene Browns T. Kniss "[name]"'s saccharomyces boulardii. I am also having him maintain his ibuprofen, acetaminophen, nitroGLYCERIN, ezetimibe, clopidogrel, rosuvastatin, metoprolol tartrate, and amoxicillin.   Follow-up: Return in about 2 months (around 01/13/2021).   Marcos Eke, MSN, FNP-C Nurse Practitioner [facility] Main number: [phone]

## 2020-11-13 NOTE — Assessment & Plan Note (Signed)
Continues to do well with good adherence and tolerance to his antimicrobial regimen of amoxicillin.  Continues to have lower extremity edema of unclear origin that is improved with elevation.  No systemic symptoms of fever/chills.  Given severity of his infection will continue amoxicillin for 1 additional month and then stop medication.  Check lab work today including inflammatory markers and CBC.  Plan for follow-up in 2 months or sooner if needed.

## 2020-11-13 NOTE — Patient Instructions (Signed)
Nice to see you.  We will check some basic lab work today.  Continue to take your amoxicillin as prescribed.  Complete 1 more month of medication and then stop.  Plan for follow-up in 2 months or sooner if needed.  Have a great day and stay safe!

## 2020-11-14 LAB — SEDIMENTATION RATE: Sed Rate: 6 mm/h (ref 0–20)

## 2020-11-14 LAB — CBC
HCT: 43.1 % (ref 38.5–50.0)
Hemoglobin: 13.8 g/dL (ref 13.2–17.1)
MCH: 27.8 pg (ref 27.0–33.0)
MCHC: 32 g/dL (ref 32.0–36.0)
MCV: 86.7 fL (ref 80.0–100.0)
MPV: 10.2 fL (ref 7.5–12.5)
Platelets: 209 10*3/uL (ref 140–400)
RBC: 4.97 10*6/uL (ref 4.20–5.80)
RDW: 13.6 % (ref 11.0–15.0)
WBC: 6 10*3/uL (ref 3.8–10.8)

## 2020-11-14 LAB — C-REACTIVE PROTEIN: CRP: 1.3 mg/L (ref <8.0)

## 2021-02-05 ENCOUNTER — Encounter: Payer: Self-pay | Admitting: Family

## 2021-02-05 ENCOUNTER — Other Ambulatory Visit: Payer: Self-pay

## 2021-02-05 ENCOUNTER — Ambulatory Visit (INDEPENDENT_AMBULATORY_CARE_PROVIDER_SITE_OTHER): Payer: 59 | Admitting: Family

## 2021-02-05 VITALS — BP 125/80 | HR 68 | Temp 97.8°F | Wt 285.0 lb

## 2021-02-05 DIAGNOSIS — M7989 Other specified soft tissue disorders: Secondary | ICD-10-CM

## 2021-02-05 NOTE — Assessment & Plan Note (Signed)
Mr. Dripps has completed antibiotic therapy for Actinomyces necrotizing soft tissue infection and has been doing well off antibiotics. Previous inflammatory markers were normal. He is back to baseline. No further antibiotics needed at this time. Discussed monitoring for return of infection with symptoms including pain, redness, or swelling. No further follow up with ID needed at this time.

## 2021-02-05 NOTE — Patient Instructions (Signed)
Nice to see you.  No further treatment is necessary.  Watch for potential signs of infection including pain, swelling, redness, or drainage.   No further follow up is needed.   Have a great day and stay safe!

## 2021-02-05 NOTE — Progress Notes (Signed)
Subjective:    Patient ID: Adam Haney, male    DOB: 03/12/1966, 55 y.o.   MRN: [id number]  Chief Complaint  Patient presents with   Necrotizing soft tissue infection   HPI:  Adam Haney is a 55 y.o. male with necrotizing soft tissue infection with actinomyces s/p I&D last seen on 11/13/20 with good adherence and tolerance to his amoxicillin. He was to continue antibiotics for 1 additional month and then stop antibiotics. Inflammatory markers at the time were normal with ESR of 6 and CRP 1.3 Here today for follow up.   Adam Haney completed course of amoxicillin about 1 month ago and has been off antibiotics. Has noted improvement in his GI symptoms as well as decreased lower extremity edema since stopping medication. Feeling back to baseline. Does have some numbness around his incision site. Denied fevers, chills or pain.    No Known Allergies    Outpatient Medications Prior to Visit  Medication Sig Dispense Refill   acetaminophen (TYLENOL) 500 MG tablet Take 2 tablets (1,000 mg total) by mouth every 6 (six) hours.     clopidogrel (PLAVIX) 75 MG tablet Take 1 tablet (75 mg total) by mouth daily. 90 tablet 3   ezetimibe (ZETIA) 10 MG tablet TAKE 1 TABLET BY MOUTH EVERY DAY 90 tablet 3   ibuprofen (ADVIL) 800 MG tablet Take 800 mg by mouth every 6 (six) hours as needed for headache.     metoprolol tartrate (LOPRESSOR) 25 MG tablet TAKE 1 TABLET BY MOUTH TWICE A DAY 180 tablet 3   nitroGLYCERIN (NITROSTAT) 0.4 MG SL tablet PLACE 1 TABLET UNDER THE TONGUE EVERY 5 MINUTES AS NEEDED FOR CHEST PAIN 25 tablet 3   rosuvastatin (CRESTOR) 40 MG tablet TAKE 1 TABLET BY MOUTH EVERY DAY 90 tablet 3   amoxicillin (AMOXIL) 500 MG capsule TAKE 1 CAPSULE BY MOUTH THREE TIMES A DAY (Patient not taking: Reported on 02/05/2021) 90 capsule 2   No facility-administered medications prior to visit.     Past Medical History:  Diagnosis Date   Coronary artery disease    a. 10/2012 Inf STEMI/Cath/PCI:  LM 20p, LAD 80-90p, 45md, D1 sm 99d, LCX mild plaque, RCA 100d (2.75x16 Promus Premier DES into PDA, 3.25x20 Promus Prem DES dRCA, 3.5x12 Promus Prem DES mRCA), EF 55-60%;  b. 10/2012 Staged PCI of LAD: 3.5x12 Promus Prem DES.   Hyperlipidemia    Leg pain    ABIs 3/16: Normal   Nephrolithiasis    "passed on it's own" (4May 26, 2014   [place]) [place]    a. in setting of MI 10/2012 - Echo: EF 55%, mild LVH.   Osgood-Schlatter's disease ~1983   "right knee" (405/26/14     Past Surgical History:  Procedure Laterality Date   APPLICATION OF WOUND VAC Right 05/27/2020   Procedure: APPLICATION OF WOUND VAC;  Surgeon: LJesusita Oka MD;  [place]  Service: General;  Laterality: Right;   B[place]elineRight 05/27/2020   Procedure: IRRIGATION AND DEBRIDEMENT OF RIGHT GROIN WOUND;  Surgeon: LJesusita Oka MD;  [place]  Service: General;  Laterality: Right;   IRRIGATION AND DEBRIDEMENT ABSCESS Right 05/25/2020   Procedure: IRRIGATION AND DEBRIDEMENT ABSCESS GROIN;  Surgeon: LJesusita Oka MD;  [facility]  Service: General;  Laterality: Right;   LEFT HEART CATHETERIZATION WITH CORONARY ANGIOGRAM N/A 11/19/2012   Procedure: LEFT HEART CATHETERIZATION WITH CORONARY ANGIOGRAM;  Surgeon: Burnell Blanks, MD;  Location: [facility] CATH LAB;  Service: Cardiovascular;  Laterality: N/A;   [place]   "L5-S1 ruptured disc" (11/21/2012)   PERCUTANEOUS CORONARY STENT INTERVENTION (PCI-S) N/A 11/21/2012   Procedure: PERCUTANEOUS CORONARY STENT INTERVENTION (PCI-S);  Surgeon: Burnell Blanks, MD;  Location: [facility] CATH LAB;  Service: Cardiovascular;  Laterality: N/A;   ULNAR COLLATERAL LIGAMENT REPAIR Left 2012       Review of Systems  Constitutional:  Negative for chills, diaphoresis, fatigue and fever.  Respiratory:  Negative for cough, chest tightness, shortness of breath and wheezing.    Cardiovascular:  Negative for chest pain.  Gastrointestinal:  Negative for abdominal pain, diarrhea, nausea and vomiting.     Objective:    BP 125/80   Pulse 68   Temp 97.8 F (36.6 C) (Oral)   Wt 285 lb (129.3 kg)   SpO2 99%   BMI 36.59 kg/m  Nursing note and vital signs reviewed.  Physical Exam Constitutional:      General: He is not in acute distress.    Appearance: He is well-developed.  Cardiovascular:     Rate and Rhythm: Normal rate and regular rhythm.     Heart sounds: Normal heart sounds.  Pulmonary:     Effort: Pulmonary effort is normal.     Breath sounds: Normal breath sounds.  Skin:    General: Skin is warm and dry.  Neurological:     Mental Status: He is alert and oriented to person, place, and time.  Psychiatric:        Behavior: Behavior normal.        Thought Content: Thought content normal.        Judgment: Judgment normal.     Depression screen [facility] 2/9 11/13/2020 06/03/2020  Decreased Interest 0 0  Down, Depressed, Hopeless 0 0  PHQ - 2 Score 0 0       Assessment & Plan:    Patient Active Problem List   Diagnosis Date Noted   Necrotizing soft tissue infection 05/25/2020   Hyperglycemia 05/25/2020   Class 1 obesity due to excess calories with body mass index (BMI) of 32.0 to 32.9 in adult 05/25/2020   NSVT (nonsustained ventricular tachycardia) (HCC)    Hyperlipidemia    ST elevation myocardial infarction (STEMI) of inferior wall ([place]) 11/19/2012   Tobacco abuse 11/19/2012   Coronary atherosclerosis of native coronary artery 11/19/2012     Problem List Items Addressed This Visit       Other   Necrotizing soft tissue infection - Primary    Adam Haney has completed antibiotic therapy for Actinomyces necrotizing soft tissue infection and has been doing well off antibiotics. Previous inflammatory markers were normal. He is back to baseline. No further antibiotics needed at this time. Discussed monitoring for return of infection with  symptoms including pain, redness, or swelling. No further follow up with ID needed at this time.          I have discontinued Adam Haney "Adam Haney"'s amoxicillin. I am also having him maintain his ibuprofen, acetaminophen, nitroGLYCERIN, ezetimibe, clopidogrel, rosuvastatin, and metoprolol tartrate.   Follow-up: As needed   Terri Piedra, MSN, FNP-C Nurse Practitioner [facility] [place] number: [phone]

## 2021-09-17 ENCOUNTER — Ambulatory Visit (INDEPENDENT_AMBULATORY_CARE_PROVIDER_SITE_OTHER): Payer: 59 | Admitting: Cardiovascular Disease

## 2021-09-17 ENCOUNTER — Encounter: Payer: Self-pay | Admitting: Cardiovascular Disease

## 2021-09-17 ENCOUNTER — Other Ambulatory Visit: Payer: Self-pay

## 2021-09-17 VITALS — BP 140/80 | HR 96 | Ht 74.0 in | Wt 295.0 lb

## 2021-09-17 DIAGNOSIS — E7800 Pure hypercholesterolemia, unspecified: Secondary | ICD-10-CM

## 2021-09-17 DIAGNOSIS — I2510 Atherosclerotic heart disease of native coronary artery without angina pectoris: Secondary | ICD-10-CM | POA: Diagnosis not present

## 2021-09-17 NOTE — Progress Notes (Signed)
Chief Complaint  Patient presents with   Follow-up    CAD   History of Present Illness: 56 yo male with history of CAD, tobacco abuse and HLD here today for cardiac follow up. He was admitted to [facility] 11/19/12 with an inferior STEMI. Cardiac cath 11/19/12 with 90% mid RCA stenosis, 100% distal RCA stenosis. The distal vessel was treated with overlapping drug eluting stents and the mid vessel was treated with a drug eluting stent. He was brought back Dec 20, 2012 for staged PCI of the severe ostial LAD stenosis treated with a drug eluting stent. He had residual high grade disease in the small caliber diagonal branch which was felt to be too small for PCI. LVEF 55% by echo post cath. ABI normal March 2016. Exercise stress test in 2016 without ischemia.   He is here today for follow up. The patient denies any chest pain, dyspnea, palpitations, lower extremity edema, orthopnea, PND, dizziness, near syncope or syncope.    Primary Care Physician: [name], MD  Past Medical History:  Diagnosis Date   Coronary artery disease    a. 10/2012 Inf STEMI/Cath/PCI: LM 20p, LAD 80-90p, 85m/d, D1 sm 99d, LCX mild plaque, RCA 100d (2.75x16 Promus Premier DES into PDA, 3.25x20 Promus Prem DES dRCA, 3.5x12 Promus Prem DES mRCA), EF 55-60%;  b. 10/2012 Staged PCI of LAD: 3.5x12 Promus Prem DES.   Hyperlipidemia    Leg pain    ABIs 3/16: Normal   Nephrolithiasis    "passed on it's own" (12-20-12)   NSVT (nonsustained ventricular tachycardia)    a. in setting of MI 10/2012 - Echo: EF 55%, mild LVH.   Osgood-Schlatter's disease ~1983   "right knee" (Dec 20, 2012)    Past Surgical History:  Procedure Laterality Date   APPLICATION OF WOUND VAC Right 05/27/2020   Procedure: APPLICATION OF WOUND VAC;  Surgeon: Diamantina Monks, MD;  [facility];  Service: General;  Laterality: Right;   BACK SURGERY  1995   INCISION AND DRAINAGE OF WOUND Right 05/27/2020   Procedure: IRRIGATION AND DEBRIDEMENT OF RIGHT GROIN  WOUND;  Surgeon: Diamantina Monks, MD;  Location: [facility] OR;  Service: General;  Laterality: Right;   IRRIGATION AND DEBRIDEMENT ABSCESS Right 05/25/2020   Procedure: IRRIGATION AND DEBRIDEMENT ABSCESS GROIN;  Surgeon: Diamantina Monks, MD;  Location: [facility] OR;  Service: General;  Laterality: Right;   LEFT HEART CATHETERIZATION WITH CORONARY ANGIOGRAM N/A 11/19/2012   Procedure: LEFT HEART CATHETERIZATION WITH CORONARY ANGIOGRAM;  Surgeon: Kathleene Hazel, MD;  Location: [facility] CATH LAB;  Service: Cardiovascular;  Laterality: N/A;   LUMBAR DISC SURGERY  1995   "L5-S1 ruptured disc" (12/20/2012)   PERCUTANEOUS CORONARY STENT INTERVENTION (PCI-S) N/A 12-20-12   Procedure: PERCUTANEOUS CORONARY STENT INTERVENTION (PCI-S);  Surgeon: Kathleene Hazel, MD;  Location: [facility] CATH LAB;  Service: Cardiovascular;  Laterality: N/A;   ULNAR COLLATERAL LIGAMENT REPAIR Left 2012    Current Outpatient Medications  Medication Sig Dispense Refill   acetaminophen (TYLENOL) 500 MG tablet Take 2 tablets (1,000 mg total) by mouth every 6 (six) hours.     clopidogrel (PLAVIX) 75 MG tablet Take 1 tablet (75 mg total) by mouth daily. 90 tablet 3   ezetimibe (ZETIA) 10 MG tablet TAKE 1 TABLET BY MOUTH EVERY DAY 90 tablet 3   ibuprofen (ADVIL) 800 MG tablet Take 800 mg by mouth every 6 (six) hours as needed for headache.     metoprolol tartrate (LOPRESSOR) 25 MG tablet TAKE 1 TABLET BY  MOUTH TWICE A DAY 180 tablet 3   nitroGLYCERIN (NITROSTAT) 0.4 MG SL tablet PLACE 1 TABLET UNDER THE TONGUE EVERY 5 MINUTES AS NEEDED FOR CHEST PAIN 25 tablet 3   rosuvastatin (CRESTOR) 40 MG tablet TAKE 1 TABLET BY MOUTH EVERY DAY 90 tablet 3   No current facility-administered medications for this visit.    No Known Allergies  Social History   Socioeconomic History   Marital status: Single    Spouse name: Not on file   Number of children: Not on file   Years of education: Not on file   Highest education level: Not on file   Occupational History   Not on file  Tobacco Use   Smoking status: Former    Packs/day: 0.50    Years: 18.00    Pack years: 9.00    Types: Cigarettes   Smokeless tobacco: Never   Tobacco comments:    11/21/2012 offered smoking sessation materials; pt declines  Vaping Use   Vaping Use: Never used  Substance and Sexual Activity   Alcohol use: Yes    Comment: socially   Drug use: No   Sexual activity: Yes  Other Topics Concern   Not on file  Social History Narrative   Not on file   Social Determinants of Health   Financial Resource Strain: Not on file  Food Insecurity: Not on file  Transportation Needs: Not on file  Physical Activity: Not on file  Stress: Not on file  Social Connections: Not on file  Intimate Partner Violence: Not on file    Family History  Problem Relation Age of Onset   CAD Mother     Review of Systems:  As stated in the HPI and otherwise negative.   BP 140/80    Pulse 96    Ht 6\' 2"  (1.88 m)    Wt 295 lb (133.8 kg)    SpO2 99%    BMI 37.88 kg/m   Physical Examination:  General: Well developed, well nourished, NAD  HEENT: OP clear, mucus membranes moist  SKIN: warm, dry. No rashes. Neuro: No focal deficits  Musculoskeletal: Muscle strength 5/5 all ext  Psychiatric: Mood and affect normal  Neck: No JVD, no carotid bruits, no thyromegaly, no lymphadenopathy.  Lungs:Clear bilaterally, no wheezes, rhonci, crackles Cardiovascular: Regular rate and rhythm. No murmurs, gallops or rubs. Abdomen:Soft. Bowel sounds present. Non-tender.  Extremities: No lower extremity edema. Pulses are 2 + in the bilateral DP/PT.  Cardiac cath 11/19/12: Left main: 20% proximal stenosis.  Left Anterior Descending Artery: Large caliber vessel that courses to the apex. 80-90% proximal stenosis. The mid and distal vessel has serial 30% stenoses. The first diagonal branch is small to moderate in caliber with 99% stenosis in the distal portion of the vessel where it becomes  very small caliber (1.0 mm).  Circumflex Artery: Large caliber vessel with early moderate caliber intermediate branch with mild plaque. The continuation of the AV groove Circumflex is small to moderate in caliber with mild diffuse plaque.  Right Coronary Artery: Large dominant vessel with mild proximal plaque, 90% mid stenosis, 100% occlusion distal vessel before the bifurcation.  Left Ventricular Angiogram: LVEF 55-60%.   EKG:  EKG is ordered today. The ekg ordered today demonstrates sinus  Recent Labs: 11/13/2020: Hemoglobin 13.8; Platelets 209   Lipid Panel    Component Value Date/Time   CHOL 141 08/06/2020 1023   TRIG 89 08/06/2020 1023   HDL 49 08/06/2020 1023   CHOLHDL 2.9 08/06/2020 1023  CHOLHDL 3.9 10/14/2015 0822   VLDL 23 10/14/2015 0822   LDLCALC 75 08/06/2020 1023     Wt Readings from Last 3 Encounters:  09/17/21 295 lb (133.8 kg)  02/05/21 285 lb (129.3 kg)  11/13/20 268 lb (121.6 kg)     Other studies Reviewed: Additional studies/ records that were reviewed today include: . Review of the above records demonstrates:    Assessment and Plan:   1. CAD without angina: No chest pain. Continue statin, Plavix and beta blocker.  Will repeat echo now to assess LVEF  2. Tobacco abuse, in remission: He no longer smokes.   3. Hyperlipidemia: LDL near goal in January 2022. Will continue statin and Zetia. Repeat lipids and LFTs now.   Current medicines are reviewed at length with the patient today.  The patient does not have concerns regarding medicines.  The following changes have been made:  no change  Labs/ tests ordered today include:   Orders Placed This Encounter  Procedures   Hepatic function panel   Lipid panel   ECHOCARDIOGRAM COMPLETE    Disposition:   F/U with me in 12  months  Signed, Verne Carrow, MD 09/17/2021 2:50 PM    [facility] [address] [place], [place], [place]  [address] Phone: [phone]; Fax: [phone]

## 2021-09-17 NOTE — Patient Instructions (Signed)
Medication Instructions:  Your physician recommends that you continue on your current medications as directed. Please refer to the Current Medication list given to you today.  *If you need a refill on your cardiac medications before your next appointment, please call your pharmacy*   Lab Work: Same day as echocardiogram: fasting lipids, LFTs If you have labs (blood work) drawn today and your tests are completely normal, you will receive your results only by: [place] (if you have MyChart) OR A paper copy in the mail If you have any lab test that is abnormal or we need to change your treatment, we will call you to review the results.   Testing/Procedures: Your physician has requested that you have an echocardiogram. Echocardiography is a painless test that uses sound waves to create images of your heart. It provides your doctor with information about the size and shape of your heart and how well your hearts chambers and valves are working. This procedure takes approximately one hour. There are no restrictions for this procedure.  Follow-Up: At [facility], you and your health needs are our priority.  As part of our continuing mission to provide you with exceptional heart care, we have created designated Provider Care Teams.  These Care Teams include your primary Cardiologist (physician) and Advanced Practice Providers (APPs -  Physician Assistants and Nurse Practitioners) who all work together to provide you with the care you need, when you need it.  Your next appointment:   1 year(s)  The format for your next appointment:   In Person  Provider:   Lauree Chandler, MD

## 2021-09-17 NOTE — Addendum Note (Signed)
Addended by: Vernard Gambles on: 09/17/2021 04:12 PM   Modules accepted: Orders

## 2021-10-05 ENCOUNTER — Other Ambulatory Visit: Payer: 59

## 2021-10-05 ENCOUNTER — Other Ambulatory Visit: Payer: Self-pay

## 2021-10-05 ENCOUNTER — Ambulatory Visit (HOSPITAL_COMMUNITY): Payer: 59 | Attending: Cardiovascular Disease

## 2021-10-05 DIAGNOSIS — I2510 Atherosclerotic heart disease of native coronary artery without angina pectoris: Secondary | ICD-10-CM | POA: Insufficient documentation

## 2021-10-05 DIAGNOSIS — E7800 Pure hypercholesterolemia, unspecified: Secondary | ICD-10-CM

## 2021-10-05 LAB — LIPID PANEL
Chol/HDL Ratio: 3.2 ratio (ref 0.0–5.0)
Cholesterol, Total: 123 mg/dL (ref 100–199)
HDL: 39 mg/dL — ABNORMAL LOW (ref >39)
LDL Chol Calc (NIH): 66 mg/dL (ref 0–99)
Triglycerides: 95 mg/dL (ref 0–149)
VLDL Cholesterol Cal: 18 mg/dL (ref 5–40)

## 2021-10-05 LAB — HEPATIC FUNCTION PANEL
ALT: 32 IU/L (ref 0–44)
AST: 28 IU/L (ref 0–40)
Albumin: 4.2 g/dL (ref 3.8–4.9)
Alkaline Phosphatase: 99 IU/L (ref 44–121)
Bilirubin Total: 0.8 mg/dL (ref 0.0–1.2)
Bilirubin, Direct: 0.23 mg/dL (ref 0.00–0.40)
Total Protein: 6.8 g/dL (ref 6.0–8.5)

## 2021-10-05 LAB — ECHOCARDIOGRAM COMPLETE
Area-P 1/2: 5.38 cm2
S' Lateral: 3.4 cm

## 2021-10-12 IMAGING — CT CT ABD-PELV W/ CM
2 of 5 series · 15 of 46 positions shown, 17 images · IV contrast (Omni 300)
Comparison: 01/03/2016

CLINICAL DATA: Fever. Right pelvic cellulitis and abscess.

EXAM:
CT ABDOMEN AND PELVIS WITH CONTRAST
TECHNIQUE: Multidetector CT imaging of the abdomen and pelvis was performed
using the standard protocol following bolus administration of
intravenous contrast.
CONTRAST:  100mL OMNIPAQUE IOHEXOL 300 MG/ML  SOLN

[Series 3: a/p w/ 5mm · axial · 0.88mm/px · z∈[-780,-156]mm · 12 of 139 slices shown, 14 images]
[im 7/139  soft-tissue]
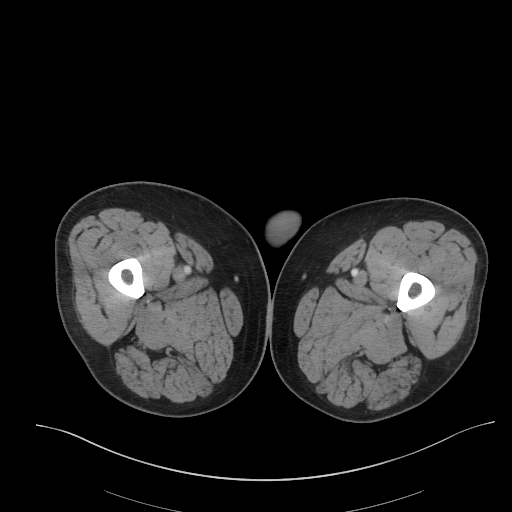
[im 7/139  bone]
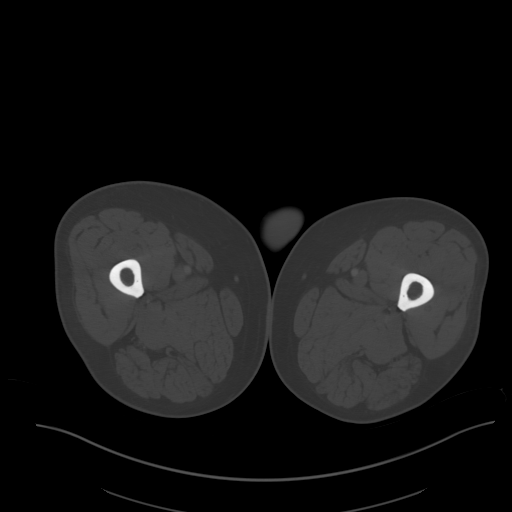
[im 20/139  soft-tissue]
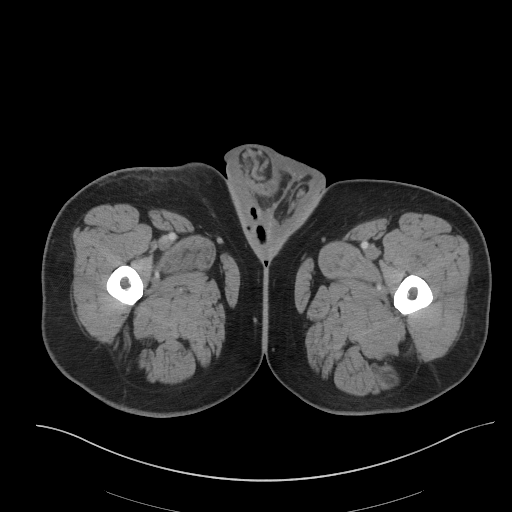
[im 33/139  soft-tissue]
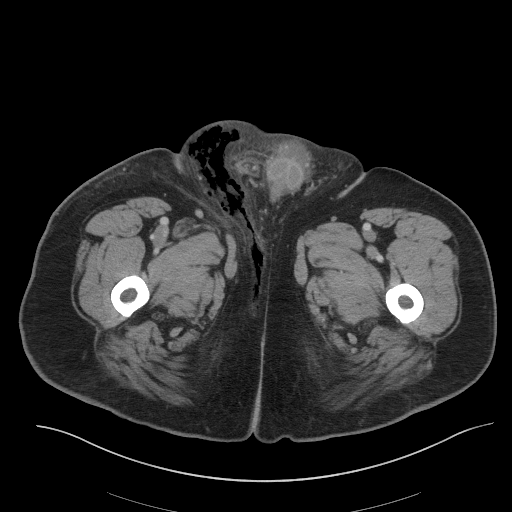
[im 40/139  soft-tissue]
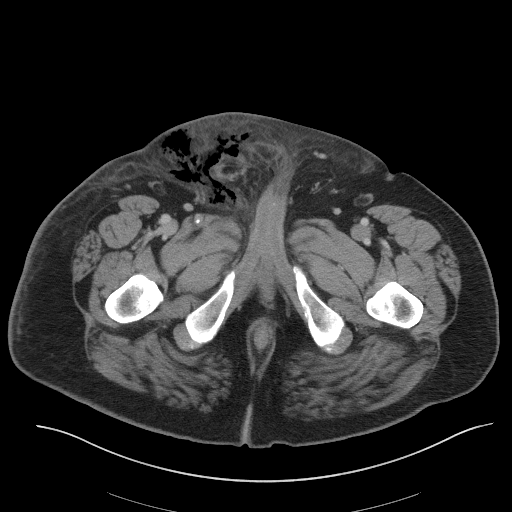
[im 53/139  soft-tissue]
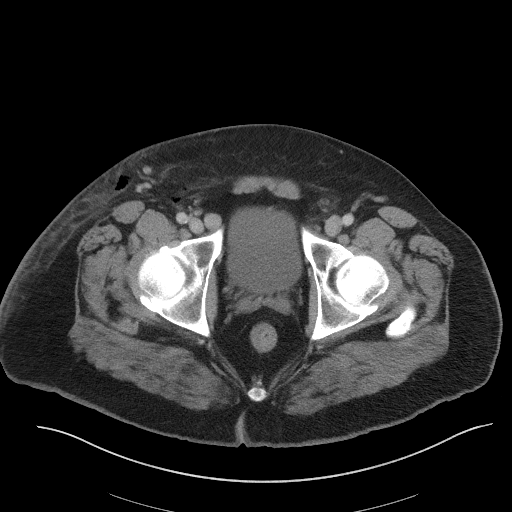
[im 66/139  soft-tissue]
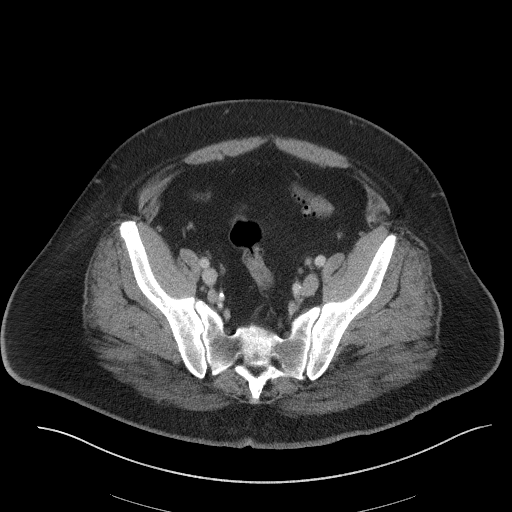
[im 73/139  soft-tissue]
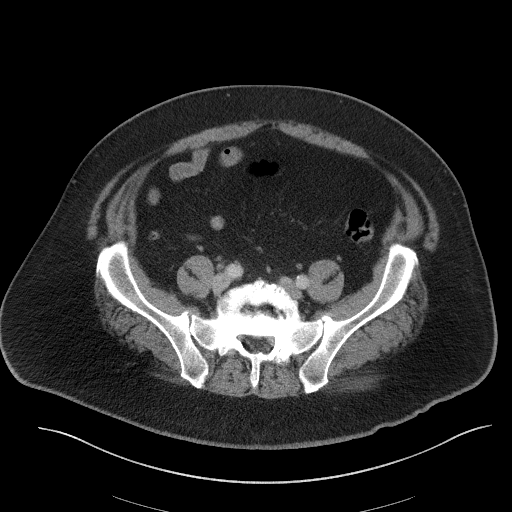
[im 86/139  soft-tissue]
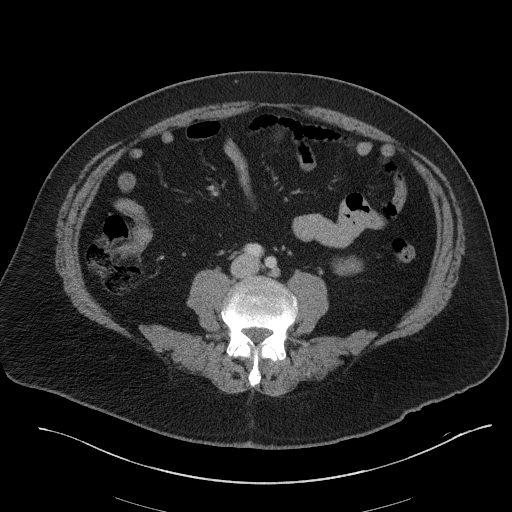
[im 99/139  soft-tissue]
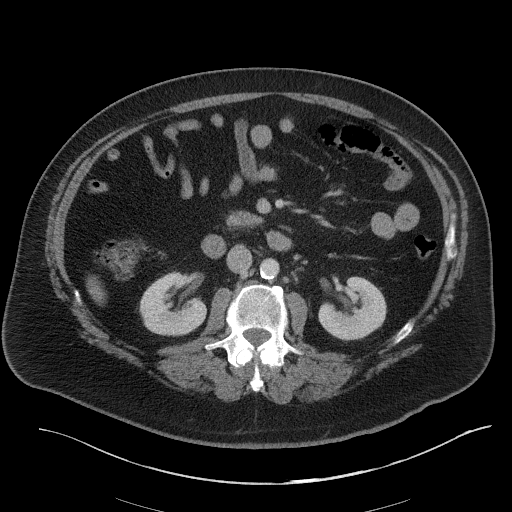
[im 99/139  bone]
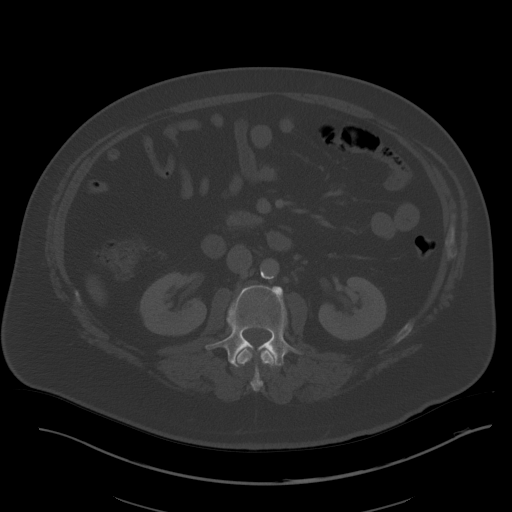
[im 106/139  soft-tissue]
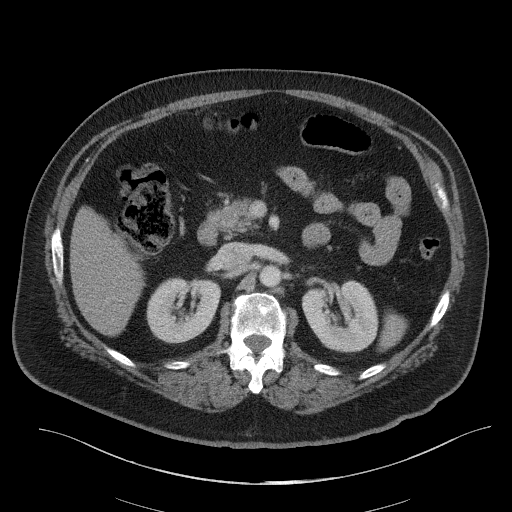
[im 119/139  soft-tissue]
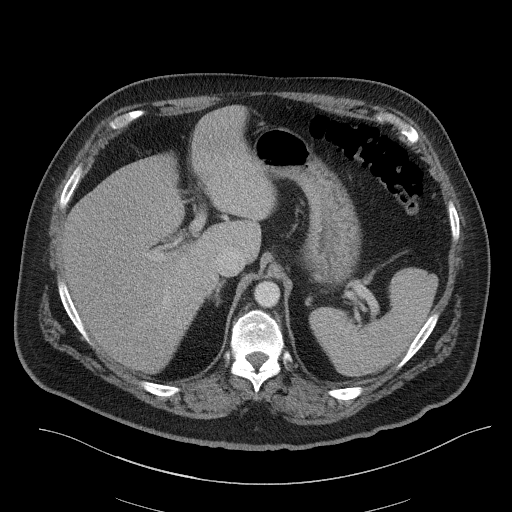
[im 132/139  soft-tissue]
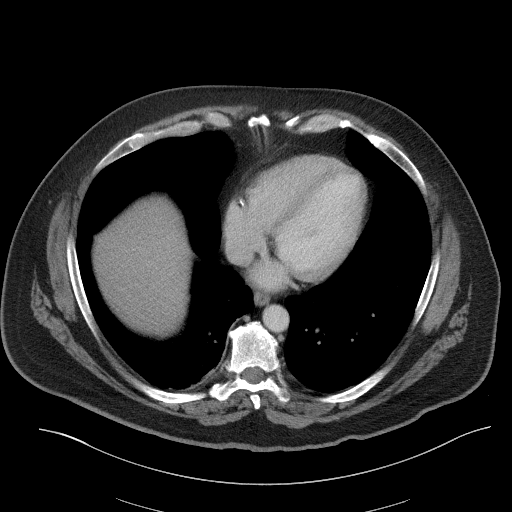

[Series 6: a/p w/ cor · coronal · 0.86mm/px · 3 of 180 slices shown]
[im 60/180  soft-tissue]
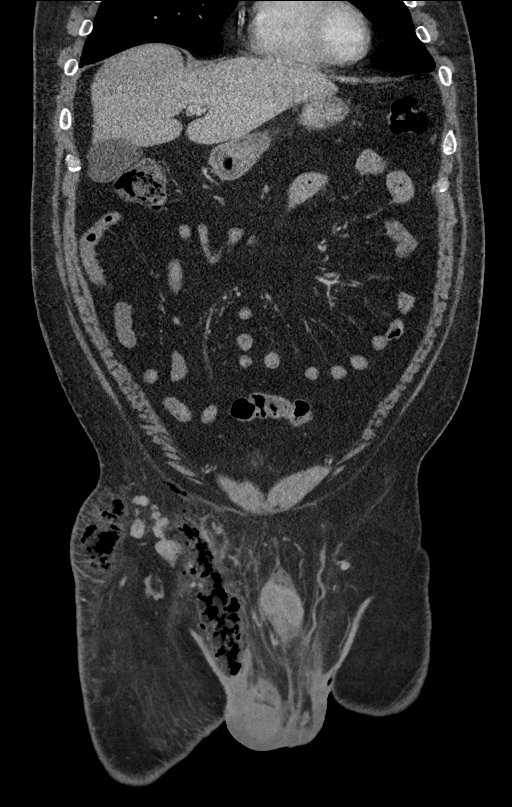
[im 80/180  soft-tissue]
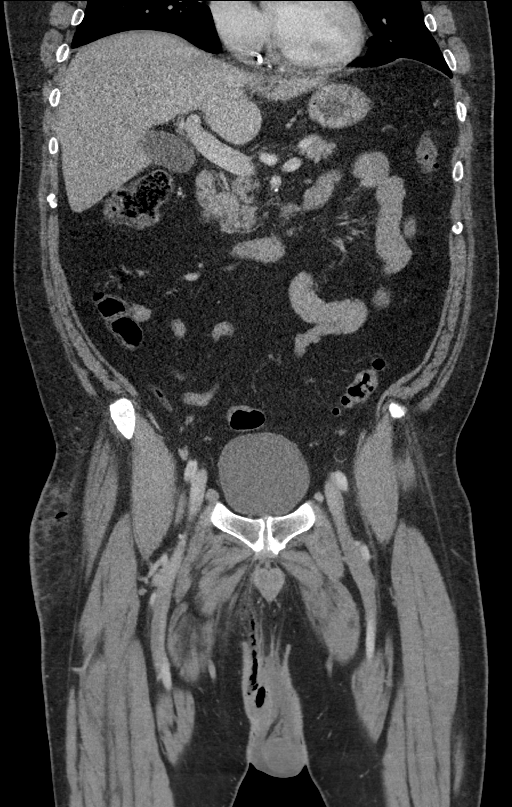
[im 100/180  soft-tissue]
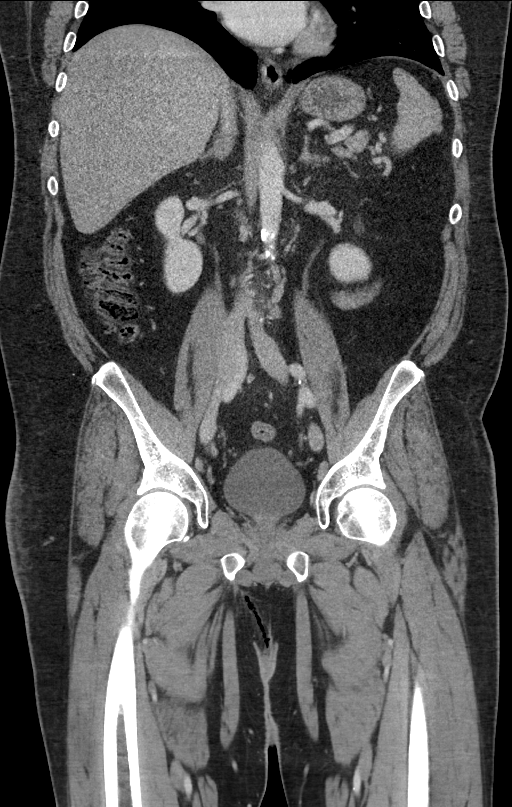

[15 of 46 positions shown; findings below may reference images not displayed]

FINDINGS: Lower chest: Mild atelectasis and or scarring at the right lung
base. Coronary artery calcification and/or stents.

Hepatobiliary: Normal

Pancreas: Normal

Spleen: Normal

Adrenals/Urinary Tract: Adrenal glands are normal. Kidneys are
normal. No cyst, mass, stone or hydronephrosis. Bladder is normal.

Stomach/Bowel: Normal.

Vascular/Lymphatic: Aortic atherosclerosis. No aneurysm. IVC is
normal. No retroperitoneal adenopathy.

Reproductive: Normal

Other: Soft tissue infection in the right groin region with edema
and soft tissue gas. No circumscribed abscess collection. Edema and
gas extend into the proximal right scrotal region. No
intraperitoneal or retroperitoneal extension.

Musculoskeletal: Ordinary lower lumbar degenerative changes.
IMPRESSION: 1. Soft tissue infection in the right groin region with edema and
soft tissue gas. No circumscribed abscess collection. Edema and gas
extend into the proximal right scrotal region. No intraperitoneal or
retroperitoneal extension.
2. Aortic atherosclerosis. Coronary artery calcification and/or
stents.

Aortic Atherosclerosis ([id number].S).

## 2021-10-13 ENCOUNTER — Other Ambulatory Visit: Payer: Self-pay | Admitting: Cardiovascular Disease

## 2021-10-15 IMAGING — US US HEPATIC LIVER DOPPLER
1 series · 14 of 25 positions shown · non-contrast
Comparison: None.

CLINICAL DATA: Elevated LFTs

EXAM:
DUPLEX ULTRASOUND OF LIVER
TECHNIQUE: Color and duplex Doppler ultrasound was performed to evaluate the
hepatic in-flow and out-flow vessels.

[Series 1: us liver doppler · 14 of 100 slices shown]
[im 1/100]
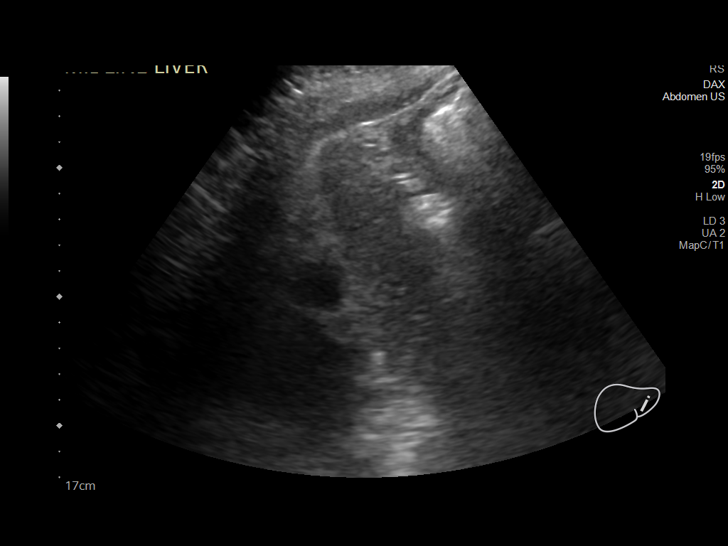
[im 9/100]
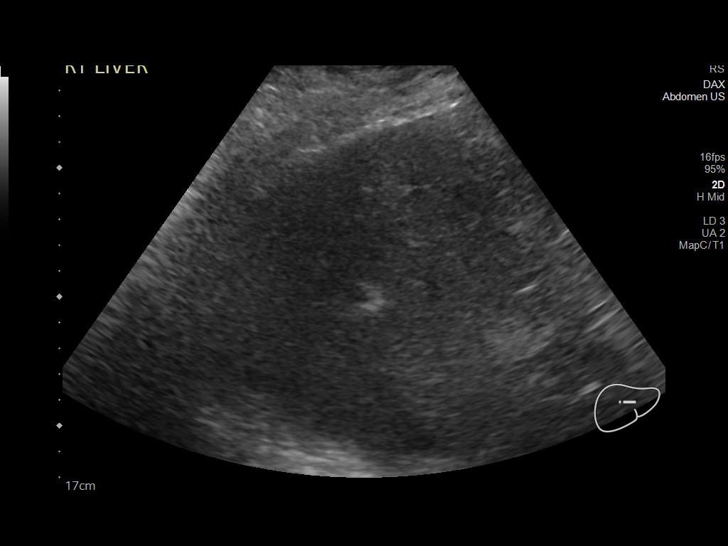
[im 17/100]
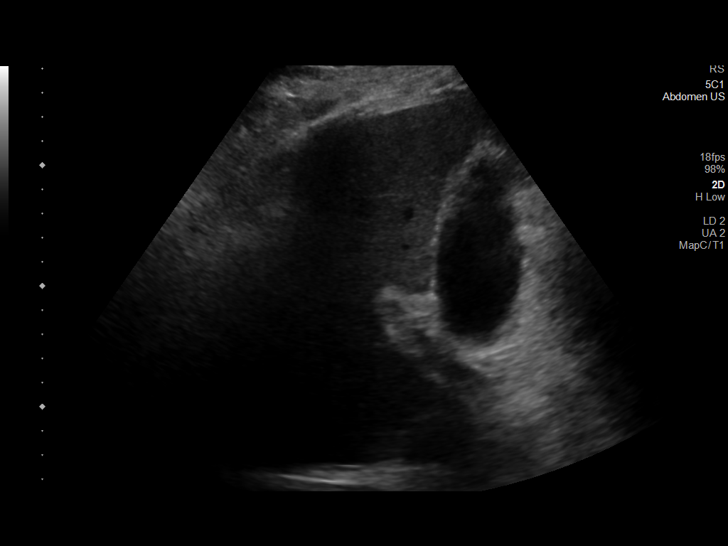
[im 25/100]
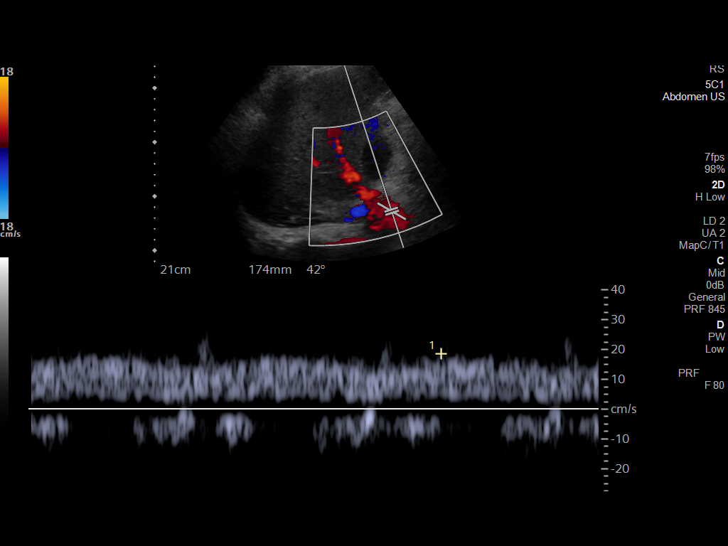
[im 34/100]
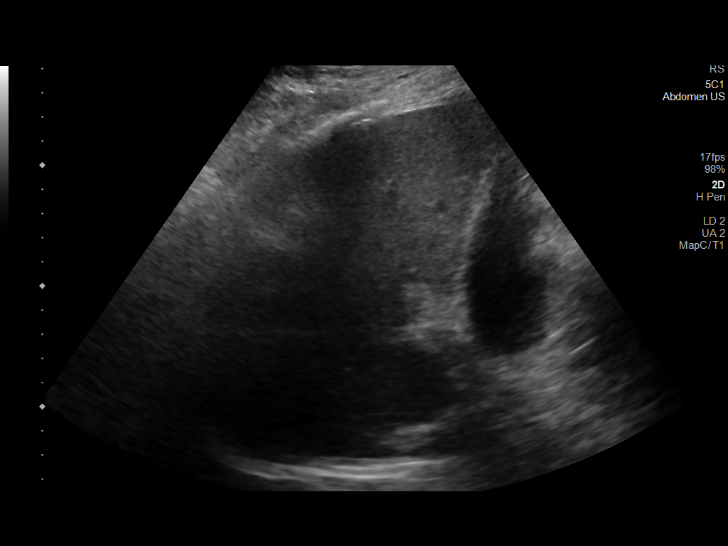
[im 38/100]
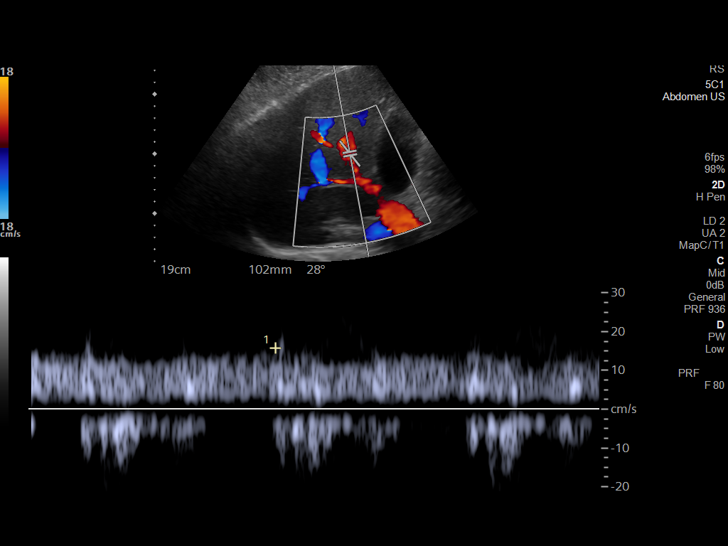
[im 46/100]
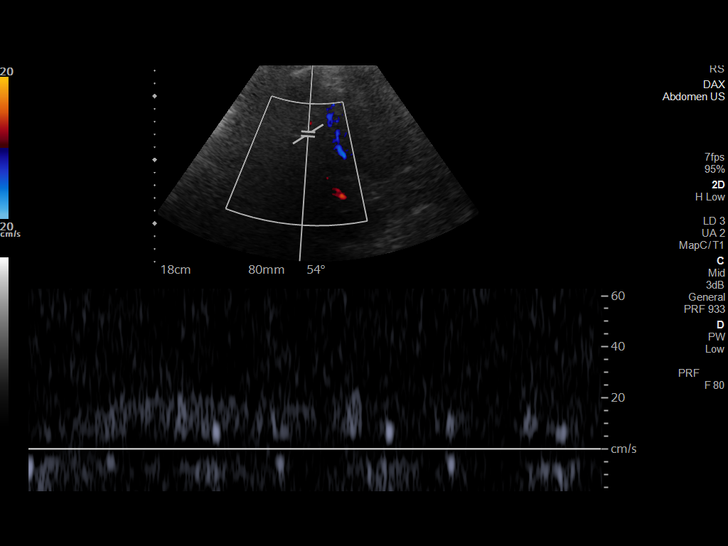
[im 54/100]
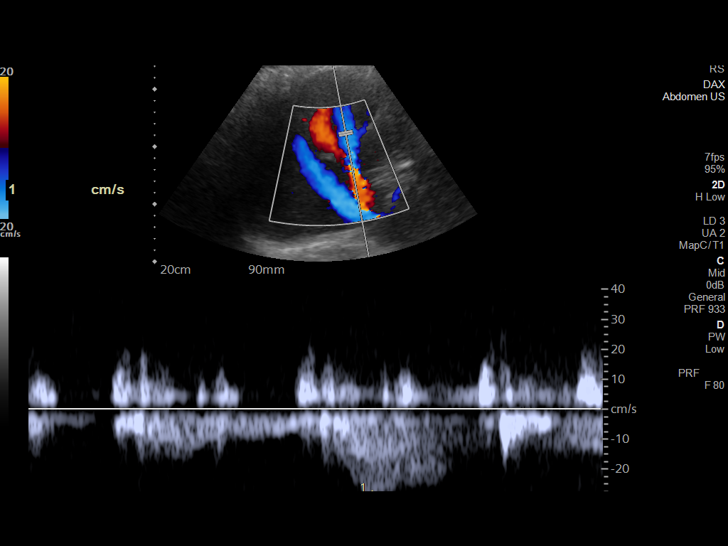
[im 62/100]
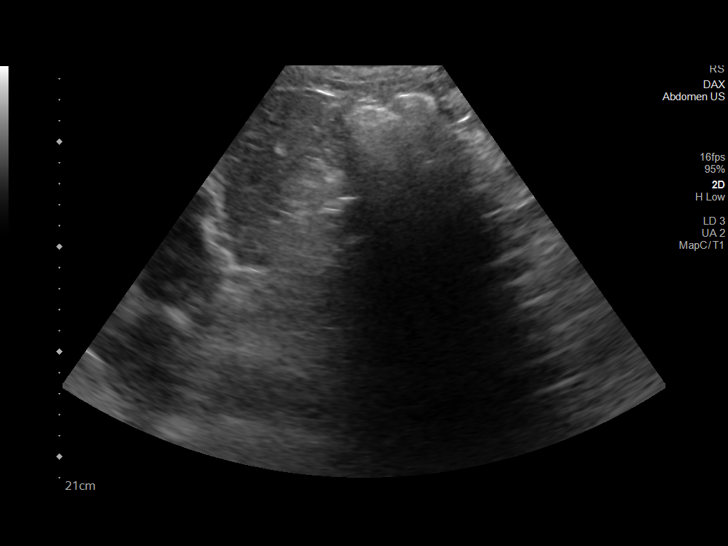
[im 67/100]
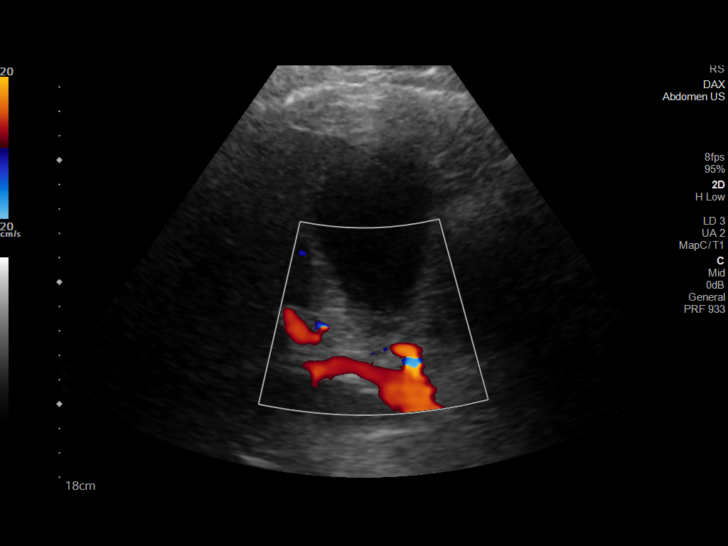
[im 75/100]
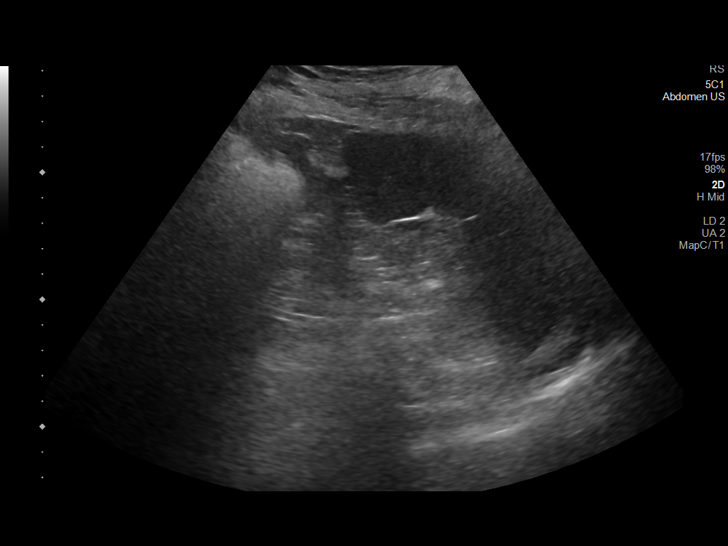
[im 83/100]
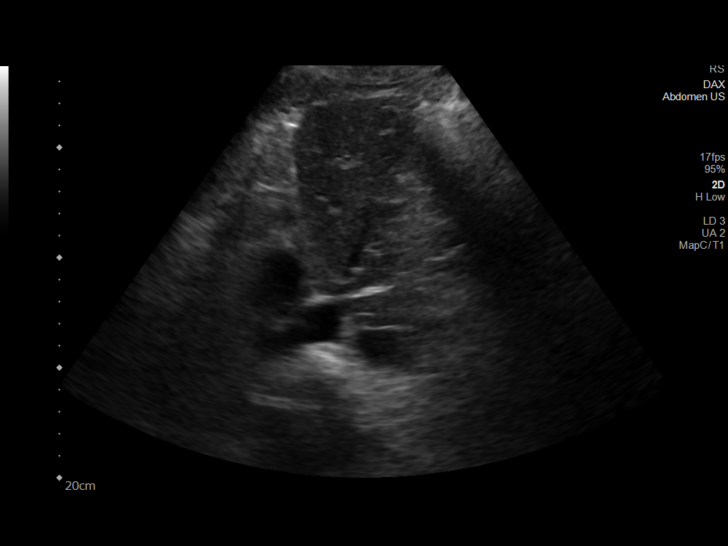
[im 91/100]
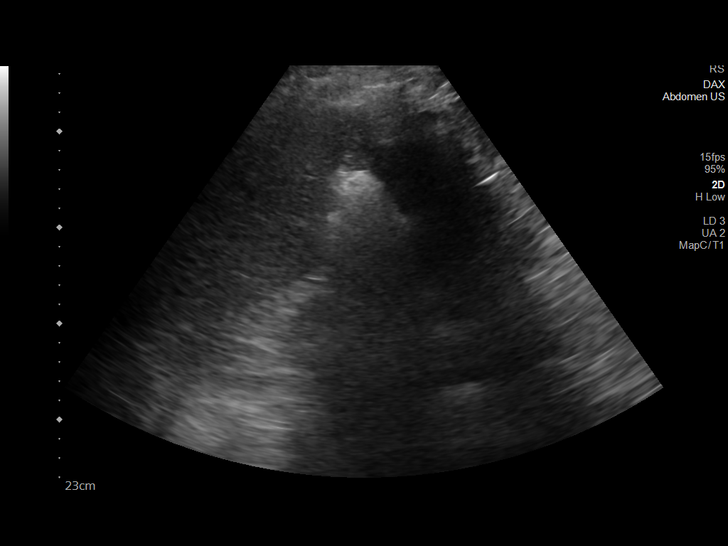
[im 100/100]
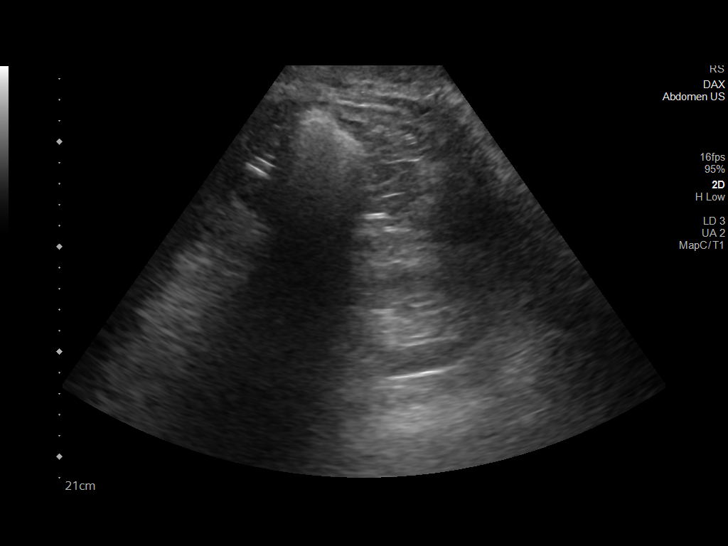

[14 of 25 positions shown; findings below may reference images not displayed]

FINDINGS: Liver: Mildly increased and heterogeneous normal hepatic contour
without nodularity.

No focal lesion, mass or intrahepatic biliary ductal dilatation.

Main Portal Vein size: 0.8 cm

Portal Vein Velocities

Main Prox:  21 cm/sec

Main Mid: 21 cm/sec

Main Dist:  22 cm/sec
Right: 15 cm/sec
Left: 24 cm/sec

Hepatic Vein Velocities

Right:  16 cm/sec

Middle:  29 cm/sec

Left:  31 cm/sec

IVC: Present and patent with normal respiratory phasicity.

Hepatic Artery Velocity:  109 cm/sec

Splenic Vein Velocity:  24 cm/sec

Spleen: 8.3 cm x 11.1 cm x 3.2 cm with a total volume of 152 cm^3
(411 cm^3 is upper limit normal)

Portal Vein Occlusion/Thrombus: No

Splenic Vein Occlusion/Thrombus: No

Ascites: None

Varices: None

Hepatic, portal and splenic veins are patent with normal directional
flow. Negative for portal vein occlusion or thrombus. No ascites.
IMPRESSION: Normal hepatic venous Doppler.

## 2021-11-23 ENCOUNTER — Telehealth: Payer: Self-pay | Admitting: Family

## 2021-11-23 NOTE — Telephone Encounter (Signed)
We received a refill request for amoxicillin for the patient. Per last office visit note patient does not need any further antibiotics. ?I reached out to the patient to follow up with him to discuss any concerns of infection that he may have. Patient stated he was requesting the refill on the amoxicillin for a tooth infection that he feels he has. I advised the patient he will need to schedule with his dentist to be evaluated and if they are not able to get him in soon he will need to reach out to his pcp. Patient verbalized understanding. ?Radha Coggins T Demareon [place] ? ?

## 2022-10-21 ENCOUNTER — Other Ambulatory Visit: Payer: Self-pay | Admitting: Cardiovascular Disease

## 2022-10-30 NOTE — Progress Notes (Signed)
Chief Complaint  Patient presents with   Follow-up    CAD   History of Present Illness: 57 yo male with history of CAD, former tobacco abuse, RBBB and HLD here today for cardiac follow up. He was admitted to [facility] in April 2014 with an inferior STEMI. Cardiac cath 11/19/12 with 90% mid RCA stenosis, 100% distal RCA stenosis. The distal vessel was treated with overlapping drug eluting stents and the mid vessel was treated with a drug eluting stent. He was brought back Nov 22, 2012 for staged PCI of the severe ostial LAD stenosis treated with a drug eluting stent. He had residual high grade disease in the small caliber diagonal branch which was felt to be too small for PCI. LVEF 55% by echo post cath. ABI normal March 2016. Exercise stress test in 2016 without ischemia. Echo March 2023 with LVEF=60-65%. No valve disease.   He is here today for follow up. The patient denies any chest pain, dyspnea, palpitations, lower extremity edema, orthopnea, PND, dizziness, near syncope or syncope.    Primary Care Physician: [name], MD  Past Medical History:  Diagnosis Date   Coronary artery disease    a. 10/2012 Inf STEMI/Cath/PCI: LM 20p, LAD 80-90p, 27m/d, D1 sm 99d, LCX mild plaque, RCA 100d (2.75x16 Promus Premier DES into PDA, 3.25x20 Promus Prem DES dRCA, 3.5x12 Promus Prem DES mRCA), EF 55-60%;  b. 10/2012 Staged PCI of LAD: 3.5x12 Promus Prem DES.   Hyperlipidemia    Leg pain    ABIs 3/16: Normal   Nephrolithiasis    "passed on it's own" (2012-11-22)   NSVT (nonsustained ventricular tachycardia)    a. in setting of MI 10/2012 - Echo: EF 55%, mild LVH.   Osgood-Schlatter's disease ~1983   "right knee" (2012-11-22)    Past Surgical History:  Procedure Laterality Date   APPLICATION OF WOUND VAC Right 05/27/2020   Procedure: APPLICATION OF WOUND VAC;  Surgeon: Diamantina Monks, MD;  Location: [facility] OR;  Service: General;  Laterality: Right;   BACK SURGERY  1995   INCISION AND DRAINAGE OF WOUND Right  05/27/2020   Procedure: IRRIGATION AND DEBRIDEMENT OF RIGHT GROIN WOUND;  Surgeon: Diamantina Monks, MD;  Location: [facility] OR;  Service: General;  Laterality: Right;   IRRIGATION AND DEBRIDEMENT ABSCESS Right 05/25/2020   Procedure: IRRIGATION AND DEBRIDEMENT ABSCESS GROIN;  Surgeon: Diamantina Monks, MD;  Location: [facility] OR;  Service: General;  Laterality: Right;   LEFT HEART CATHETERIZATION WITH CORONARY ANGIOGRAM N/A 11/19/2012   Procedure: LEFT HEART CATHETERIZATION WITH CORONARY ANGIOGRAM;  Surgeon: Kathleene Hazel, MD;  Location: [facility] CATH LAB;  Service: Cardiovascular;  Laterality: N/A;   LUMBAR DISC SURGERY  1995   "L5-S1 ruptured disc" (2012-11-22)   PERCUTANEOUS CORONARY STENT INTERVENTION (PCI-S) N/A 11-22-2012   Procedure: PERCUTANEOUS CORONARY STENT INTERVENTION (PCI-S);  Surgeon: Kathleene Hazel, MD;  Location: [facility] CATH LAB;  Service: Cardiovascular;  Laterality: N/A;   ULNAR COLLATERAL LIGAMENT REPAIR Left 2012    Current Outpatient Medications  Medication Sig Dispense Refill   acetaminophen (TYLENOL) 500 MG tablet Take 2 tablets (1,000 mg total) by mouth every 6 (six) hours. (Patient taking differently: Take 1,000 mg by mouth as needed.)     clopidogrel (PLAVIX) 75 MG tablet TAKE 1 TABLET BY MOUTH EVERY DAY 90 tablet 0   ezetimibe (ZETIA) 10 MG tablet TAKE 1 TABLET BY MOUTH EVERY DAY 90 tablet 0   ibuprofen (ADVIL) 800 MG tablet Take 800 mg by mouth every 6 (  six) hours as needed for headache.     metoprolol tartrate (LOPRESSOR) 25 MG tablet TAKE 1 TABLET BY MOUTH TWICE A DAY 180 tablet 3   nitroGLYCERIN (NITROSTAT) 0.4 MG SL tablet PLACE 1 TABLET UNDER THE TONGUE EVERY 5 MINUTES AS NEEDED FOR CHEST PAIN 25 tablet 3   rosuvastatin (CRESTOR) 40 MG tablet TAKE 1 TABLET BY MOUTH EVERY DAY 90 tablet 0   No current facility-administered medications for this visit.    No Known Allergies  Social History   Socioeconomic History   Marital status: Single    Spouse name: Not on  file   Number of children: Not on file   Years of education: Not on file   Highest education level: Not on file  Occupational History   Not on file  Tobacco Use   Smoking status: Former    Packs/day: 0.50    Years: 18.00    Additional pack years: 0.00    Total pack years: 9.00    Types: Cigarettes   Smokeless tobacco: Never   Tobacco comments:    11/21/2012 offered smoking sessation materials; pt declines  Vaping Use   Vaping Use: Never used  Substance and Sexual Activity   Alcohol use: Yes    Comment: socially   Drug use: No   Sexual activity: Yes  Other Topics Concern   Not on file  Social History Narrative   Not on file   Social Determinants of Health   Financial Resource Strain: Not on file  Food Insecurity: Not on file  Transportation Needs: Not on file  Physical Activity: Not on file  Stress: Not on file  Social Connections: Not on file  Intimate Partner Violence: Not on file    Family History  Problem Relation Age of Onset   CAD Mother     Review of Systems:  As stated in the HPI and otherwise negative.   BP 122/84   Pulse 75   Ht 6\' 3"  (1.905 m)   Wt 134.3 kg   SpO2 95%   BMI 37.00 kg/m   Physical Examination: General: Well developed, well nourished, NAD  HEENT: OP clear, mucus membranes moist  SKIN: warm, dry. No rashes. Neuro: No focal deficits  Musculoskeletal: Muscle strength 5/5 all ext  Psychiatric: Mood and affect normal  Neck: No JVD, no carotid bruits, no thyromegaly, no lymphadenopathy.  Lungs:Clear bilaterally, no wheezes, rhonci, crackles Cardiovascular: Regular rate and rhythm. No murmurs, gallops or rubs. Abdomen:Soft. Bowel sounds present. Non-tender.  Extremities: No lower extremity edema. Pulses are 2 + in the bilateral DP/PT.  EKG:  EKG is ordered today. The ekg ordered today demonstrates sinus, RBBB, Inferior Q waves.   Echo March 2023:  1. Challenging images with poor endocardial border definition. Patient  declined  Definity contrast. Overall function appears grossly normal but  cannot rule out subtle wall motion abnormalities.   2. Left ventricular ejection fraction, by estimation, is 60 to 65%. The  left ventricle has normal function. The left ventricle has no regional  wall motion abnormalities. Left ventricular diastolic parameters were  normal.   3. Right ventricular systolic function is normal. The right ventricular  size is normal.   4. The mitral valve is normal in structure. Trivial mitral valve  regurgitation. No evidence of mitral stenosis.   5. The aortic valve is tricuspid. There is mild calcification of the  aortic valve. Aortic valve regurgitation is not visualized. No aortic  stenosis is present.   6. The inferior  vena cava is normal in size with greater than 50%  respiratory variability, suggesting right atrial pressure of 3 mmHg.   7. Evidence of atrial level shunting detected by color flow Doppler.   Recent Labs: No results found for requested labs within last 365 days.   Lipid Panel    Component Value Date/Time   CHOL 123 10/05/2021 0720   TRIG 95 10/05/2021 0720   HDL 39 (L) 10/05/2021 0720   CHOLHDL 3.2 10/05/2021 0720   CHOLHDL 3.9 10/14/2015 0822   VLDL 23 10/14/2015 0822   LDLCALC 66 10/05/2021 0720     Wt Readings from Last 3 Encounters:  10/31/22 134.3 kg  09/17/21 133.8 kg  02/05/21 129.3 kg    Assessment and Plan:   1. CAD without angina: LV function normal by echo in 2023. No chest pain suggestive of angina. Will continue Plavix, statin and beta blocker.   2. Tobacco abuse, in remission: He no longer smokes.   3. Hyperlipidemia: LDL at goal in March 2023. Continue Crestor and Zetia. Repeat lipids and LFTs now.   Labs/ tests ordered today include:   Orders Placed This Encounter  Procedures   Lipid panel   Hepatic function panel   EKG 12-Lead   Disposition:   F/U with me in 12  months  Signed, Verne Carrow, MD 10/31/2022 8:50 AM    [facility] [address] [place], [place], [place]  [address] Phone: [phone]; Fax: [phone]

## 2022-10-31 ENCOUNTER — Ambulatory Visit: Payer: 59 | Attending: Cardiovascular Disease | Admitting: Cardiovascular Disease

## 2022-10-31 ENCOUNTER — Encounter: Payer: Self-pay | Admitting: Cardiovascular Disease

## 2022-10-31 VITALS — BP 122/84 | HR 75 | Ht 75.0 in | Wt 296.0 lb

## 2022-10-31 DIAGNOSIS — I2510 Atherosclerotic heart disease of native coronary artery without angina pectoris: Secondary | ICD-10-CM

## 2022-10-31 DIAGNOSIS — E7800 Pure hypercholesterolemia, unspecified: Secondary | ICD-10-CM

## 2022-10-31 DIAGNOSIS — I10 Essential (primary) hypertension: Secondary | ICD-10-CM | POA: Diagnosis not present

## 2022-10-31 NOTE — Patient Instructions (Signed)
Medication Instructions:  No changes *If you need a refill on your cardiac medications before your next appointment, please call your pharmacy*   Lab Work: Today: lipids/liver If you have labs (blood work) drawn today and your tests are completely normal, you will receive your results only by: MyChart [place] (if you have MyChart) OR A paper copy in the mail If you have any lab test that is abnormal or we need to change your treatment, we will call you to review the results.   Testing/Procedures: none   Follow-Up: At [facility], you and your health needs are our priority.  As part of our continuing mission to provide you with exceptional heart care, we have created designated Provider Care Teams.  These Care Teams include your primary Cardiologist (physician) and Advanced Practice Providers (APPs -  Physician Assistants and Nurse Practitioners) who all work together to provide you with the care you need, when you need it.   Your next appointment:   12 month(s)  Provider:   Verne Carrow, MD

## 2022-11-01 LAB — HEPATIC FUNCTION PANEL
ALT: 24 IU/L (ref 0–44)
AST: 27 IU/L (ref 0–40)
Albumin: 4.2 g/dL (ref 3.8–4.9)
Alkaline Phosphatase: 98 IU/L (ref 44–121)
Bilirubin Total: 0.9 mg/dL (ref 0.0–1.2)
Bilirubin, Direct: 0.24 mg/dL (ref 0.00–0.40)
Total Protein: 7.4 g/dL (ref 6.0–8.5)

## 2022-11-01 LAB — LIPID PANEL
Chol/HDL Ratio: 3.1 ratio (ref 0.0–5.0)
Cholesterol, Total: 134 mg/dL (ref 100–199)
HDL: 43 mg/dL (ref >39)
LDL Chol Calc (NIH): 73 mg/dL (ref 0–99)
Triglycerides: 96 mg/dL (ref 0–149)
VLDL Cholesterol Cal: 18 mg/dL (ref 5–40)

## 2023-01-19 ENCOUNTER — Other Ambulatory Visit: Payer: Self-pay | Admitting: Cardiovascular Disease

## 2023-10-30 ENCOUNTER — Encounter: Payer: Self-pay | Admitting: Cardiovascular Disease

## 2023-10-30 ENCOUNTER — Ambulatory Visit: Payer: 59 | Attending: Cardiovascular Disease | Admitting: Cardiovascular Disease

## 2023-10-30 VITALS — BP 142/86 | HR 68 | Ht 75.0 in | Wt 286.2 lb

## 2023-10-30 DIAGNOSIS — I10 Essential (primary) hypertension: Secondary | ICD-10-CM

## 2023-10-30 DIAGNOSIS — E7800 Pure hypercholesterolemia, unspecified: Secondary | ICD-10-CM

## 2023-10-30 DIAGNOSIS — I2510 Atherosclerotic heart disease of native coronary artery without angina pectoris: Secondary | ICD-10-CM

## 2023-10-30 MED ORDER — NITROGLYCERIN 0.4 MG SL SUBL: 0.4000 mg | SUBLINGUAL_TABLET | SUBLINGUAL | 3 refills | Status: DC | PRN

## 2023-10-30 NOTE — Patient Instructions (Signed)
 Medication Instructions:  No changes *If you need a refill on your cardiac medications before your next appointment, please call your pharmacy*  Lab Work: Lipids, liver at [employer]  If you have labs (blood work) drawn today and your tests are completely normal, you will receive your results only by: MyChart Message (if you have MyChart) OR A paper copy in the mail If you have any lab test that is abnormal or we need to change your treatment, we will call you to review the results.  Testing/Procedures: none  Follow-Up: At [facility], you and your health needs are our priority.  As part of our continuing mission to provide you with exceptional heart care, our providers are all part of one team.  This team includes your primary Cardiologist (physician) and Advanced Practice Providers or APPs (Physician Assistants and Nurse Practitioners) who all work together to provide you with the care you need, when you need it.  Your next appointment:   12 month(s)  Provider:   Verne Carrow, MD       1st Floor: - Lobby - Registration  - Pharmacy  - Lab - Cafe  2nd Floor: - PV Lab - Diagnostic Testing (echo, CT, nuclear med)  3rd Floor: - Vacant  4th Floor: - TCTS (cardiothoracic surgery) - AFib Clinic - Structural Heart Clinic - Vascular Surgery  - Vascular Ultrasound  5th Floor: - HeartCare Cardiology (general and EP) - Clinical Pharmacy for coumadin, hypertension, lipid, weight-loss medications, and med management appointments    Valet parking services will be available as well.

## 2023-10-30 NOTE — Progress Notes (Signed)
 Chief Complaint  Patient presents with   Follow-up    CAD   History of Present Illness: 58 yo male with history of CAD, former tobacco abuse, RBBB and HLD here today for cardiac follow up. He was admitted to [facility] in April 2014 with an inferior STEMI. Cardiac cath April 2014 with 90% mid RCA stenosis and 100% distal RCA stenosis treated with overlapping drug eluting stents in the distal RCA and a single drugl eluting stent in the mid vessel. He had staged PCI of the severe ostial LAD stenosis treated with a drug eluting stent. He had residual high grade disease in the small caliber diagonal branch which was felt to be too small for PCI. LVEF 55% by echo post cath. Echo March 2023 with LVEF=60-65%. No valve disease.   He is here today for follow up. The patient denies any chest pain, dyspnea, palpitations, lower extremity edema, orthopnea, PND, dizziness, near syncope or syncope.    Primary Care Physician: No primary care provider on file.  Past Medical History:  Diagnosis Date   Coronary artery disease    a. 10/2012 Inf STEMI/Cath/PCI: LM 20p, LAD 80-90p, 90m/d, D1 sm 99d, LCX mild plaque, RCA 100d (2.75x16 Promus Premier DES into PDA, 3.25x20 Promus Prem DES dRCA, 3.5x12 Promus Prem DES mRCA), EF 55-60%;  b. 10/2012 Staged PCI of LAD: 3.5x12 Promus Prem DES.   Hyperlipidemia    Leg pain    ABIs 3/16: Normal   Nephrolithiasis    "passed on it's own" (2012-12-11)   NSVT (nonsustained ventricular tachycardia) (HCC)    a. in setting of MI 10/2012 - Echo: EF 55%, mild LVH.   Osgood-Schlatter's disease ~1983   "right knee" (2012/12/11)    Past Surgical History:  Procedure Laterality Date   APPLICATION OF WOUND VAC Right 05/27/2020   Procedure: APPLICATION OF WOUND VAC;  Surgeon: Diamantina Monks, MD;  Location: [facility] OR;  Service: General;  Laterality: Right;   BACK SURGERY  1995   INCISION AND DRAINAGE OF WOUND Right 05/27/2020   Procedure: IRRIGATION AND DEBRIDEMENT OF RIGHT GROIN WOUND;   Surgeon: Diamantina Monks, MD;  Location: [facility] OR;  Service: General;  Laterality: Right;   IRRIGATION AND DEBRIDEMENT ABSCESS Right 05/25/2020   Procedure: IRRIGATION AND DEBRIDEMENT ABSCESS GROIN;  Surgeon: Diamantina Monks, MD;  Location: [facility] OR;  Service: General;  Laterality: Right;   LEFT HEART CATHETERIZATION WITH CORONARY ANGIOGRAM N/A 11/19/2012   Procedure: LEFT HEART CATHETERIZATION WITH CORONARY ANGIOGRAM;  Surgeon: Kathleene Hazel, MD;  Location[facility] CATH LAB;  Service: Cardiovascular;  Laterality: N/A;   LUMBAR DISC SURGERY  1995   "L5-S1 ruptured disc" (Dec 11, 2012)   PERCUTANEOUS CORONARY STENT INTERVENTION (PCI-S) N/A 11-Dec-2012   Procedure: PERCUTANEOUS CORONARY STENT INTERVENTION (PCI-S);  Surgeon: Kathleene Hazel, MD;  Location: [facility] CATH LAB;  Service: Cardiovascular;  Laterality: N/A;   ULNAR COLLATERAL LIGAMENT REPAIR Left 2012    Current Outpatient Medications  Medication Sig Dispense Refill   acetaminophen (TYLENOL) 500 MG tablet Take 2 tablets (1,000 mg total) by mouth every 6 (six) hours. (Patient taking differently: Take 1,000 mg by mouth as needed.)     clopidogrel (PLAVIX) 75 MG tablet TAKE 1 TABLET BY MOUTH EVERY DAY 90 tablet 3   ezetimibe (ZETIA) 10 MG tablet TAKE 1 TABLET BY MOUTH EVERY DAY 90 tablet 3   ibuprofen (ADVIL) 800 MG tablet Take 800 mg by mouth every 6 (six) hours as needed for headache.     metoprolol  tartrate (LOPRESSOR) 25 MG tablet TAKE 1 TABLET BY MOUTH TWICE A DAY 180 tablet 3   rosuvastatin (CRESTOR) 40 MG tablet TAKE 1 TABLET BY MOUTH EVERY DAY 90 tablet 3   nitroGLYCERIN (NITROSTAT) 0.4 MG SL tablet Place 1 tablet (0.4 mg total) under the tongue every 5 (five) minutes as needed for chest pain. 25 tablet 3   No current facility-administered medications for this visit.    No Known Allergies  Social History   Socioeconomic History   Marital status: Single    Spouse name: Not on file   Number of children: Not on file   Years of  education: Not on file   Highest education level: Not on file  Occupational History   Not on file  Tobacco Use   Smoking status: Former    Current packs/day: 0.50    Average packs/day: 0.5 packs/day for 18.0 years (9.0 ttl pk-yrs)    Types: Cigarettes   Smokeless tobacco: Never   Tobacco comments:    11/21/2012 offered smoking sessation materials; pt declines  Vaping Use   Vaping status: Never Used  Substance and Sexual Activity   Alcohol use: Yes    Comment: socially   Drug use: No   Sexual activity: Yes  Other Topics Concern   Not on file  Social History Narrative   Not on file   Social Drivers of Health   Financial Resource Strain: Not on file  Food Insecurity: Not on file  Transportation Needs: Not on file  Physical Activity: Not on file  Stress: Not on file  Social Connections: Not on file  Intimate Partner Violence: Not on file    Family History  Problem Relation Age of Onset   CAD Mother     Review of Systems:  As stated in the HPI and otherwise negative.   BP (!) 142/86   Pulse 68   Ht 6\' 3"  (1.905 m)   Wt 129.8 kg   SpO2 92%   BMI 35.77 kg/m   Physical Examination: General: Well developed, well nourished, NAD  HEENT: OP clear, mucus membranes moist  SKIN: warm, dry. No rashes. Neuro: No focal deficits  Musculoskeletal: Muscle strength 5/5 all ext  Psychiatric: Mood and affect normal  Neck: No JVD, no carotid bruits, no thyromegaly, no lymphadenopathy.  Lungs:Clear bilaterally, no wheezes, rhonci, crackles Cardiovascular: Regular rate and rhythm. No murmurs, gallops or rubs. Abdomen:Soft. Bowel sounds present. Non-tender.  Extremities: No lower extremity edema. Pulses are 2 + in the bilateral DP/PT.  EKG:  EKG is ordered today. The ekg ordered today demonstrates  EKG Interpretation Date/Time:  Monday October 30 2023 09:39:55 EDT Ventricular Rate:  72 PR Interval:    QRS Duration:  124 QT Interval:  430 QTC Calculation: 470 R  Axis:   75  Text Interpretation: Sinus rhythm Right bundle branch block Confirmed by Verne Carrow [id number]) on 10/30/2023 9:44:06 AM    Echo March 2023:  1. Challenging images with poor endocardial border definition. Patient  declined Definity contrast. Overall function appears grossly normal but  cannot rule out subtle wall motion abnormalities.   2. Left ventricular ejection fraction, by estimation, is 60 to 65%. The  left ventricle has normal function. The left ventricle has no regional  wall motion abnormalities. Left ventricular diastolic parameters were  normal.   3. Right ventricular systolic function is normal. The right ventricular  size is normal.   4. The mitral valve is normal in structure. Trivial mitral valve  regurgitation.  No evidence of mitral stenosis.   5. The aortic valve is tricuspid. There is mild calcification of the  aortic valve. Aortic valve regurgitation is not visualized. No aortic  stenosis is present.   6. The inferior vena cava is normal in size with greater than 50%  respiratory variability, suggesting right atrial pressure of 3 mmHg.   7. Evidence of atrial level shunting detected by color flow Doppler.   Recent Labs: 10/31/2022: ALT 24   Lipid Panel    Component Value Date/Time   CHOL 134 10/31/2022 0821   TRIG 96 10/31/2022 0821   HDL 43 10/31/2022 0821   CHOLHDL 3.1 10/31/2022 0821   CHOLHDL 3.9 10/14/2015 0822   VLDL 23 10/14/2015 0822   LDLCALC 73 10/31/2022 0821     Wt Readings from Last 3 Encounters:  10/30/23 129.8 kg  10/31/22 134.3 kg  09/17/21 133.8 kg    Assessment and Plan:   1. CAD without angina: LV function normal by echo in 2023. No chest pain. Continue Plavix, beta blocker and statin.   2. Tobacco abuse, in remission: He no longer smokes.   3. Hyperlipidemia: LDL near goal in April 2024. Continue Crestor and Zetia. Will check lipids and LFTS today.    4. RBBB: Chronic. No dizziness  Labs/ tests ordered today  include:   Orders Placed This Encounter  Procedures   Lipid panel   Hepatic function panel   EKG 12-Lead   Disposition:   F/U with me in 12  months  Signed, Verne Carrow, MD 10/30/2023 10:25 AM    [facility] [address] [place], [place], [place]  [address] Phone: [phone]; Fax: [phone]

## 2024-01-14 ENCOUNTER — Other Ambulatory Visit: Payer: Self-pay | Admitting: Cardiovascular Disease

## 2024-01-15 ENCOUNTER — Other Ambulatory Visit: Payer: Self-pay

## 2024-01-15 MED ORDER — METOPROLOL TARTRATE 25 MG PO TABS: 25.0000 mg | ORAL_TABLET | Freq: Two times a day (BID) | ORAL | 3 refills | Status: DC

## 2024-01-29 ENCOUNTER — Other Ambulatory Visit (HOSPITAL_BASED_OUTPATIENT_CLINIC_OR_DEPARTMENT_OTHER): Payer: Self-pay

## 2024-02-05 ENCOUNTER — Other Ambulatory Visit: Payer: Self-pay | Admitting: Cardiovascular Disease

## 2024-02-20 ENCOUNTER — Other Ambulatory Visit (HOSPITAL_BASED_OUTPATIENT_CLINIC_OR_DEPARTMENT_OTHER): Payer: Self-pay

## 2024-03-18 ENCOUNTER — Other Ambulatory Visit (HOSPITAL_BASED_OUTPATIENT_CLINIC_OR_DEPARTMENT_OTHER): Payer: Self-pay

## 2024-03-22 ENCOUNTER — Other Ambulatory Visit (HOSPITAL_BASED_OUTPATIENT_CLINIC_OR_DEPARTMENT_OTHER): Payer: Self-pay

## 2024-03-22 MED ORDER — SERTRALINE HCL 25 MG PO TABS
50.0000 mg | ORAL_TABLET | Freq: Every day | ORAL | 2 refills | Status: DC
  Filled 2024-03-22: qty 60, 30d supply, fill #0
  Filled 2024-07-04: qty 60, 30d supply, fill #1
  Filled 2024-07-30: qty 60, 30d supply, fill #2

## 2024-03-23 ENCOUNTER — Other Ambulatory Visit (HOSPITAL_BASED_OUTPATIENT_CLINIC_OR_DEPARTMENT_OTHER): Payer: Self-pay

## 2024-04-16 ENCOUNTER — Other Ambulatory Visit (HOSPITAL_BASED_OUTPATIENT_CLINIC_OR_DEPARTMENT_OTHER): Payer: Self-pay

## 2024-04-16 ENCOUNTER — Telehealth: Payer: Self-pay | Admitting: Cardiovascular Disease

## 2024-04-16 MED ORDER — NITROGLYCERIN 0.4 MG SL SUBL
0.4000 mg | SUBLINGUAL_TABLET | SUBLINGUAL | 2 refills | Status: AC | PRN
  Filled 2024-04-16 – 2024-07-30 (×2): qty 75, 30d supply, fill #0

## 2024-04-16 MED ORDER — METOPROLOL TARTRATE 25 MG PO TABS
25.0000 mg | ORAL_TABLET | Freq: Two times a day (BID) | ORAL | 2 refills | Status: AC
  Filled 2024-04-16: qty 60, 30d supply, fill #0
  Filled 2024-07-30: qty 180, 90d supply, fill #0

## 2024-04-16 MED ORDER — ROSUVASTATIN CALCIUM 40 MG PO TABS
40.0000 mg | ORAL_TABLET | Freq: Every day | ORAL | 2 refills | Status: AC
  Filled 2024-04-16: qty 30, 30d supply, fill #0
  Filled 2024-07-04: qty 30, 30d supply, fill #1
  Filled 2024-07-30: qty 30, 30d supply, fill #2
  Filled 2024-08-30: qty 30, 30d supply, fill #3

## 2024-04-16 MED ORDER — CLOPIDOGREL BISULFATE 75 MG PO TABS
75.0000 mg | ORAL_TABLET | Freq: Every day | ORAL | 2 refills | Status: AC
  Filled 2024-04-16: qty 30, 30d supply, fill #0
  Filled 2024-07-04: qty 30, 30d supply, fill #1
  Filled 2024-07-30: qty 30, 30d supply, fill #2
  Filled 2024-08-30: qty 30, 30d supply, fill #3

## 2024-04-16 MED ORDER — EZETIMIBE 10 MG PO TABS
10.0000 mg | ORAL_TABLET | Freq: Every day | ORAL | 2 refills | Status: AC
  Filled 2024-04-16: qty 30, 30d supply, fill #0
  Filled 2024-07-04: qty 30, 30d supply, fill #1
  Filled 2024-07-30: qty 30, 30d supply, fill #2
  Filled 2024-08-30: qty 30, 30d supply, fill #3

## 2024-04-16 NOTE — Telephone Encounter (Signed)
 Pt's medications were sent to pt's pharmacy as requested. Confirmation received.

## 2024-04-16 NOTE — Telephone Encounter (Signed)
*  STAT* If patient is at the pharmacy, call can be transferred to refill team.   1. Which medications need to be refilled? (please list name of each medication and dose if known)   clopidogrel  (PLAVIX ) 75 MG tablet   ezetimibe  (ZETIA ) 10 MG tablet   metoprolol  tartrate (LOPRESSOR ) 25 MG tablet    nitroGLYCERIN  (NITROSTAT ) 0.4 MG SL tablet    rosuvastatin  (CRESTOR ) 40 MG tablet    2. Which pharmacy/location (including street and city if local pharmacy) is medication to be sent to?  [facility]    3. Do they need a 30 day or 90 day supply? 90

## 2024-04-17 ENCOUNTER — Other Ambulatory Visit (HOSPITAL_BASED_OUTPATIENT_CLINIC_OR_DEPARTMENT_OTHER): Payer: Self-pay

## 2024-04-17 ENCOUNTER — Telehealth: Payer: Self-pay

## 2024-04-17 NOTE — Telephone Encounter (Signed)
   Pre-operative Risk Assessment    Patient Name: Adam Haney  DOB: 1965/12/16 MRN: [id number]   Date of last office visit: 10/30/23 LONNI CASH, MD Date of next office visit: NONE   Request for Surgical Clearance    Procedure:  ENDOSCOPY/ COLONOSCOPY  Date of Surgery:  Clearance 05/17/24                                Surgeon:  DR LINDAANN Surgeon's Group or Practice Name:  [facility] [place], [place] Phone number:  [phone] Fax number:  [phone]   Type of Clearance Requested:   - Medical  - Pharmacy:  Hold Clopidogrel  (Plavix ) 4 DAYS PRIOR   Type of Anesthesia:  Not Indicated   Additional requests/questions:    SignedLucie DELENA Ku   04/17/2024, 8:48 AM

## 2024-04-17 NOTE — Telephone Encounter (Signed)
   Name: Adam Haney  DOB: 07-18-1966  MRN: [id number]  Primary Cardiologist: Lonni Cash, MD   Preoperative team, please contact this patient and set up a phone call appointment for further preoperative risk assessment. Please obtain consent and complete medication review. Thank you for your help.  I confirm that guidance regarding antiplatelet and oral anticoagulation therapy has been completed and, if necessary, noted below.  Per office protocol, if patient is without any new symptoms or concerns at the time of their virtual visit, he may hold Plavix  for 5 days prior to procedure. Please resume Plavix  as soon as possible postprocedure, at the discretion of the surgeon.    I also confirmed the patient resides in the state of [place] . As per [facility] Medical Board telemedicine laws, the patient must reside in the state in which the provider is licensed.   Lamarr Satterfield, NP 04/17/2024, 9:01 AM [facility]

## 2024-04-17 NOTE — Telephone Encounter (Signed)
 Line busy, pt needs tele preop appt.

## 2024-04-18 NOTE — Telephone Encounter (Signed)
 2nd attempt to reach the pt to schedule tele preop appt, line busy again today.   I will send FYI to requesting office pt needs to call our office to schedule a televist preop appt.

## 2024-04-22 ENCOUNTER — Telehealth: Payer: Self-pay

## 2024-04-22 NOTE — Telephone Encounter (Signed)
 Preop tele appt now scheduled, med rec and consent done.

## 2024-04-22 NOTE — Telephone Encounter (Signed)
 Our office attempted x 3 to reach the pt. Again today the line was busy. I will update the requesting office the pt needs to call back to schedule a tele preop appt..    Will remove from preop call back until the pt calls back to schedule tele preop appt.

## 2024-04-22 NOTE — Telephone Encounter (Signed)
 Pt returning call, his number has changed [phone]

## 2024-04-22 NOTE — Telephone Encounter (Signed)
  Patient Consent for Virtual Visit        Adam Haney has provided verbal consent on 04/22/2024 for a virtual visit (video or telephone).   CONSENT FOR VIRTUAL VISIT FOR:  Adam Haney  By participating in this virtual visit I agree to the following:  I hereby voluntarily request, consent and authorize [facility] and its employed or contracted physicians, physician assistants, nurse practitioners or other licensed health care professionals (the Practitioner), to provide me with telemedicine health care services (the "Services) as deemed necessary by the treating Practitioner. I acknowledge and consent to receive the Services by the Practitioner via telemedicine. I understand that the telemedicine visit will involve communicating with the Practitioner through live audiovisual communication technology and the disclosure of certain medical information by electronic transmission. I acknowledge that I have been given the opportunity to request an in-person assessment or other available alternative prior to the telemedicine visit and am voluntarily participating in the telemedicine visit.  I understand that I have the right to withhold or withdraw my consent to the use of telemedicine in the course of my care at any time, without affecting my right to future care or treatment, and that the Practitioner or I may terminate the telemedicine visit at any time. I understand that I have the right to inspect all information obtained and/or recorded in the course of the telemedicine visit and may receive copies of available information for a reasonable fee.  I understand that some of the potential risks of receiving the Services via telemedicine include:  Delay or interruption in medical evaluation due to technological equipment failure or disruption; Information transmitted may not be sufficient (e.g. poor resolution of images) to allow for appropriate medical decision making by the  Practitioner; and/or  In rare instances, security protocols could fail, causing a breach of personal health information.  Furthermore, I acknowledge that it is my responsibility to provide information about my medical history, conditions and care that is complete and accurate to the best of my ability. I acknowledge that Practitioner's advice, recommendations, and/or decision may be based on factors not within their control, such as incomplete or inaccurate data provided by me or distortions of diagnostic images or specimens that may result from electronic transmissions. I understand that the practice of medicine is not an exact science and that Practitioner makes no warranties or guarantees regarding treatment outcomes. I acknowledge that a copy of this consent can be made available to me via my patient portal [facility] MyChart), or I can request a printed copy by calling the office of [facility].    I understand that my insurance will be billed for this visit.   I have read or had this consent read to me. I understand the contents of this consent, which adequately explains the benefits and risks of the Services being provided via telemedicine.  I have been provided ample opportunity to ask questions regarding this consent and the Services and have had my questions answered to my satisfaction. I give my informed consent for the services to be provided through the use of telemedicine in my medical care

## 2024-05-14 ENCOUNTER — Ambulatory Visit: Attending: Internal Medicine

## 2024-05-14 DIAGNOSIS — Z01810 Encounter for preprocedural cardiovascular examination: Secondary | ICD-10-CM

## 2024-05-14 NOTE — Progress Notes (Signed)
 Virtual Visit via Telephone Note   Because of JHASE CREPPEL co-morbid illnesses, he is at least at moderate risk for complications without adequate follow up.  This format is felt to be most appropriate for this patient at this time.  Due to technical limitations with video connection (technology), today's appointment will be conducted as an audio only telehealth visit, and Adam Haney verbally agreed to proceed in this manner.   All issues noted in this document were discussed and addressed.  No physical exam could be performed with this format.  Evaluation Performed:  Preoperative cardiovascular risk assessment _____________   Date:  05/14/2024  Patient ID:  Adam Haney, DOB 10-30-1965, MRN [id number] Patient Location:  Home Provider location:   Office Primary Care Provider:  No primary care provider on file. Primary Cardiologist:  Lonni Cash, MD Chief Complaint / Patient Profile  58 y.o. y/o male with a h/o CAD s/p inferior STEMI in 2014 with overlapping DES in distal RCA and single DES in mid vessel, staged PCI of severe ostial LAD stenosis, tobacco use, right bundle branch block and hyperlipidemia who is pending colonoscopy and presents today for telephonic preoperative cardiovascular risk assessment. History of Present Illness  Adam Haney is a 58 y.o. male who presents via audio/video conferencing for a telehealth visit today.  Pt was last seen in cardiology clinic on 10/30/23 by Dr. Cash.  At that time Adam Haney was doing well.  The patient is now pending procedure as outlined above. Since his last visit, he has remained stable from a cardiac standpoint. Today he denies chest pain, shortness of breath, lower extremity edema, fatigue, palpitations, melena, hematuria, hemoptysis, diaphoresis, weakness, presyncope, syncope, orthopnea, and PND.  He is able to achieve greater than 4 METS of activity. Past Medical History    Past Medical History:  Diagnosis  Date   Coronary artery disease    a. 10/2012 Inf STEMI/Cath/PCI: LM 20p, LAD 80-90p, 80m/d, D1 sm 99d, LCX mild plaque, RCA 100d (2.75x16 Promus Premier DES into PDA, 3.25x20 Promus Prem DES dRCA, 3.5x12 Promus Prem DES mRCA), EF 55-60%;  b. 10/2012 Staged PCI of LAD: 3.5x12 Promus Prem DES.   Hyperlipidemia    Leg pain    ABIs 3/16: Normal   Nephrolithiasis    passed on it's own (2012-12-12)   NSVT (nonsustained ventricular tachycardia) (HCC)    a. in setting of MI 10/2012 - Echo: EF 55%, mild LVH.   Osgood-Schlatter's disease ~1983   right knee (12/12/12)   Past Surgical History:  Procedure Laterality Date   APPLICATION OF WOUND VAC Right 05/27/2020   Procedure: APPLICATION OF WOUND VAC;  Surgeon: Paola Dreama SAILOR, MD;  Location: [facility] OR;  Service: General;  Laterality: Right;   BACK SURGERY  1995   INCISION AND DRAINAGE OF WOUND Right 05/27/2020   Procedure: IRRIGATION AND DEBRIDEMENT OF RIGHT GROIN WOUND;  Surgeon: Paola Dreama SAILOR, MD;  Location: [facility] OR;  Service: General;  Laterality: Right;   IRRIGATION AND DEBRIDEMENT ABSCESS Right 05/25/2020   Procedure: IRRIGATION AND DEBRIDEMENT ABSCESS GROIN;  Surgeon: Paola Dreama SAILOR, MD;  Location: [facility] OR;  Service: General;  Laterality: Right;   LEFT HEART CATHETERIZATION WITH CORONARY ANGIOGRAM N/A 11/19/2012   Procedure: LEFT HEART CATHETERIZATION WITH CORONARY ANGIOGRAM;  Surgeon: Lonni JONETTA Cash, MD;  Location: [facility] CATH LAB;  Service: Cardiovascular;  Laterality: N/A;   LUMBAR DISC SURGERY  1995   L5-S1 ruptured disc (Dec 12, 2012)   PERCUTANEOUS CORONARY STENT INTERVENTION (  PCI-S) N/A 11/21/2012   Procedure: PERCUTANEOUS CORONARY STENT INTERVENTION (PCI-S);  Surgeon: Lonni JONETTA Cash, MD;  Location: [facility] CATH LAB;  Service: Cardiovascular;  Laterality: N/A;   ULNAR COLLATERAL LIGAMENT REPAIR Left 2012    Allergies  No Known Allergies  Home Medications    Prior to Admission medications   Medication Sig Start Date End Date  Taking? Authorizing Provider  acetaminophen  (TYLENOL ) 500 MG tablet Take 2 tablets (1,000 mg total) by mouth every 6 (six) hours. Patient not taking: Reported on 04/22/2024 06/01/20   Tammy Sor, PA-C  clopidogrel  (PLAVIX ) 75 MG tablet Take 1 tablet (75 mg total) by mouth daily. 04/16/24   Cash Lonni JONETTA, MD  ezetimibe  (ZETIA ) 10 MG tablet Take 1 tablet (10 mg total) by mouth daily. 04/16/24   Cash Lonni JONETTA, MD  ibuprofen  (ADVIL ) 800 MG tablet Take 800 mg by mouth every 6 (six) hours as needed for headache.    [provider]  metoprolol  tartrate (LOPRESSOR ) 25 MG tablet Take 1 tablet (25 mg total) by mouth 2 (two) times daily. 04/16/24   Cash Lonni JONETTA, MD  nitroGLYCERIN  (NITROSTAT ) 0.4 MG SL tablet Place 1 tablet (0.4 mg total) under the tongue every 5 (five) minutes as needed for chest pain. 04/16/24   Cash Lonni JONETTA, MD  rosuvastatin  (CRESTOR ) 40 MG tablet Take 1 tablet (40 mg total) by mouth daily. 04/16/24   Cash Lonni JONETTA, MD  sertraline  (ZOLOFT ) 25 MG tablet Take 2 tablets (50 mg total) by mouth daily. 03/22/24      Physical Exam  Vital Signs:  Adam Haney does not have vital signs available for review today. Given telephonic nature of communication, physical exam is limited. AAOx3. NAD. Normal affect.  Speech and respirations are unlabored. Accessory Clinical Findings  None Assessment & Plan  1.  Preoperative Cardiovascular Risk Assessment: [name] perioperative risk of a major cardiac event is 0.9% according to the Revised Cardiac Risk Index (RCRI).  Therefore, he is at low risk for perioperative complications. His functional capacity is excellent at 8.91 METs according to the Duke Activity Status Index (DASI). Recommendations: According to ACC/AHA guidelines, no further cardiovascular testing needed.  The patient may proceed to surgery at acceptable risk.   Antiplatelet and/or Anticoagulation Recommendations: Clopidogrel   (Plavix ) can be held for 5 days prior to his surgery and resumed as soon as possible post op.  It is recommended that while patient's Plavix  is being held that he start aspirin  81 mg daily, to continue throughout the perioperative period.  Once Plavix  is resumed patient can discontinue use of aspirin .  The patient was advised that if he develops new symptoms prior to surgery to contact our office to arrange for a follow-up visit, and he verbalized understanding.  A copy of this note will be routed to requesting surgeon.  Time:   Today, I have spent 10 minutes with the patient with telehealth technology discussing medical history, symptoms, and management plan.    Deleah Tison D Tekeshia Klahr, NP  05/14/2024, 3:09 PM

## 2024-05-15 ENCOUNTER — Telehealth (HOSPITAL_BASED_OUTPATIENT_CLINIC_OR_DEPARTMENT_OTHER): Payer: Self-pay

## 2024-05-15 NOTE — Telephone Encounter (Signed)
   Pre-operative Risk Assessment    Patient Name: JEREMY DITULLIO  DOB: 1966/04/14 MRN: [id number]   Date of last office visit: 10/30/23 Dr. Verlin Date of next office visit: NA   Request for Surgical Clearance    Procedure:  Colonoscopy  Date of Surgery:  Clearance 05/22/24                                 Surgeon:  Dr. Lindaann Surgeon's Group or Practice Name:  [facility]  Phone number:  [phone] Fax number:  [phone]   Type of Clearance Requested:   - Medical  - Pharmacy:  Hold Clopidogrel  (Plavix ) for 4 days prior   Type of Anesthesia:  propofol    Additional requests/questions:    Bonney Augustin JONETTA Delores   05/15/2024, 12:02 PM

## 2024-05-15 NOTE — Telephone Encounter (Signed)
   Patient Name: Adam Haney  DOB: December 28, 1965 MRN: [id number]  Primary Cardiologist: Lonni Cash, MD   Preoperative Cardiovascular Risk Assessment: [name] perioperative risk of a major cardiac event is 0.9% according to the Revised Cardiac Risk Index (RCRI).  Therefore, he is at low risk for perioperative complications. His functional capacity is excellent at 8.91 METs according to the [employer] Activity Status Index (DASI). Recommendations: According to ACC/AHA guidelines, no further cardiovascular testing needed.  The patient may proceed to surgery at acceptable risk.   Antiplatelet and/or Anticoagulation Recommendations: Clopidogrel  (Plavix ) can be held for 5 days prior to his surgery and resumed as soon as possible post op.  It is recommended that while patient's Plavix  is being held that he start aspirin  81 mg daily, to continue throughout the perioperative period.  Once Plavix  is resumed patient can discontinue use of aspirin .  The patient was advised that if he develops new symptoms prior to surgery to contact our office to arrange for a follow-up visit, and he verbalized understanding.  I will route this recommendation to the requesting party via Epic fax function and remove from pre-op pool.  Please call with questions.  Lamarr Satterfield, NP 05/15/2024, 1:15 PM

## 2024-07-04 ENCOUNTER — Other Ambulatory Visit (HOSPITAL_BASED_OUTPATIENT_CLINIC_OR_DEPARTMENT_OTHER): Payer: Self-pay

## 2024-07-29 ENCOUNTER — Other Ambulatory Visit (HOSPITAL_BASED_OUTPATIENT_CLINIC_OR_DEPARTMENT_OTHER): Payer: Self-pay

## 2024-07-29 MED ORDER — FLUTICASONE PROPIONATE 50 MCG/ACT NA SUSP
2.0000 | Freq: Every day | NASAL | 2 refills | Status: AC
  Filled 2024-07-29: qty 16, 30d supply, fill #0

## 2024-07-29 MED ORDER — DOXYCYCLINE HYCLATE 100 MG PO TABS
ORAL_TABLET | ORAL | 0 refills | Status: DC
  Filled 2024-07-29: qty 20, 10d supply, fill #0

## 2024-07-29 MED ORDER — PREDNISONE 50 MG PO TABS
ORAL_TABLET | ORAL | 0 refills | Status: AC
  Filled 2024-07-29: qty 5, 5d supply, fill #0

## 2024-07-30 ENCOUNTER — Other Ambulatory Visit (HOSPITAL_BASED_OUTPATIENT_CLINIC_OR_DEPARTMENT_OTHER): Payer: Self-pay

## 2024-08-23 ENCOUNTER — Other Ambulatory Visit (HOSPITAL_BASED_OUTPATIENT_CLINIC_OR_DEPARTMENT_OTHER): Payer: Self-pay

## 2024-08-23 MED ORDER — SERTRALINE HCL 25 MG PO TABS
50.0000 mg | ORAL_TABLET | Freq: Every day | ORAL | 2 refills | Status: AC
  Filled 2024-08-23: qty 60, 30d supply, fill #0

## 2024-08-23 MED ORDER — DOXYCYCLINE HYCLATE 100 MG PO TABS
100.0000 mg | ORAL_TABLET | Freq: Two times a day (BID) | ORAL | 0 refills | Status: AC
  Filled 2024-08-23: qty 20, 10d supply, fill #0

## 2024-08-30 ENCOUNTER — Other Ambulatory Visit: Payer: Self-pay

## 2024-11-07 ENCOUNTER — Ambulatory Visit: Admitting: Cardiovascular Disease
